# Patient Record
Sex: Female | Born: 1945
Health system: Southern US, Community
[De-identification: ages and names within clinical notes are randomized; demographics above are authoritative.]

## PROBLEM LIST (undated history)

## (undated) DIAGNOSIS — K635 Polyp of colon: Secondary | ICD-10-CM

## (undated) DIAGNOSIS — E785 Hyperlipidemia, unspecified: Secondary | ICD-10-CM

## (undated) DIAGNOSIS — E669 Obesity, unspecified: Secondary | ICD-10-CM

## (undated) DIAGNOSIS — D369 Benign neoplasm, unspecified site: Secondary | ICD-10-CM

## (undated) DIAGNOSIS — I10 Essential (primary) hypertension: Secondary | ICD-10-CM

## (undated) DIAGNOSIS — E119 Type 2 diabetes mellitus without complications: Secondary | ICD-10-CM

## (undated) DIAGNOSIS — I201 Angina pectoris with documented spasm: Secondary | ICD-10-CM

## (undated) HISTORY — DX: Essential (primary) hypertension: I10

## (undated) HISTORY — DX: Benign neoplasm, unspecified site: D36.9

## (undated) HISTORY — DX: Polyp of colon: K63.5

## (undated) HISTORY — DX: Hyperlipidemia, unspecified: E78.5

## (undated) HISTORY — DX: Obesity, unspecified: E66.9

## (undated) HISTORY — PX: APPENDECTOMY: SHX54

## (undated) HISTORY — PX: CARDIAC CATHETERIZATION: SHX172

## (undated) HISTORY — DX: Type 2 diabetes mellitus without complications: E11.9

## (undated) HISTORY — PX: CHOLECYSTECTOMY: SHX55

## (undated) HISTORY — PX: TONSILLECTOMY: SUR1361

## (undated) HISTORY — PX: VESICOVAGINAL FISTULA CLOSURE W/ TAH: SUR271

## (undated) HISTORY — DX: Angina pectoris with documented spasm: I20.1

---

## 2005-09-01 ENCOUNTER — Ambulatory Visit: Payer: Self-pay | Admitting: Pulmonary Disease

## 2005-09-01 ENCOUNTER — Ambulatory Visit: Payer: Self-pay | Admitting: Cardiology

## 2005-10-26 ENCOUNTER — Ambulatory Visit: Payer: Self-pay | Admitting: Pulmonary Disease

## 2005-11-19 ENCOUNTER — Ambulatory Visit: Payer: Self-pay | Admitting: Internal Medicine

## 2005-11-25 ENCOUNTER — Ambulatory Visit: Payer: Self-pay | Admitting: Internal Medicine

## 2005-11-25 ENCOUNTER — Encounter (INDEPENDENT_AMBULATORY_CARE_PROVIDER_SITE_OTHER): Payer: Self-pay | Admitting: *Deleted

## 2006-10-21 ENCOUNTER — Ambulatory Visit: Payer: Self-pay | Admitting: Critical Care Medicine

## 2006-10-24 DIAGNOSIS — I10 Essential (primary) hypertension: Secondary | ICD-10-CM | POA: Insufficient documentation

## 2006-10-24 DIAGNOSIS — E785 Hyperlipidemia, unspecified: Secondary | ICD-10-CM | POA: Insufficient documentation

## 2006-12-13 ENCOUNTER — Other Ambulatory Visit: Admission: RE | Admit: 2006-12-13 | Discharge: 2006-12-13 | Payer: Self-pay | Admitting: Obstetrics & Gynecology

## 2007-02-16 ENCOUNTER — Ambulatory Visit: Payer: Self-pay | Admitting: Critical Care Medicine

## 2007-03-17 ENCOUNTER — Ambulatory Visit: Payer: Self-pay | Admitting: Critical Care Medicine

## 2007-04-18 ENCOUNTER — Ambulatory Visit: Payer: Self-pay | Admitting: Critical Care Medicine

## 2007-04-21 DIAGNOSIS — J4551 Severe persistent asthma with (acute) exacerbation: Secondary | ICD-10-CM

## 2007-04-21 DIAGNOSIS — J4550 Severe persistent asthma, uncomplicated: Secondary | ICD-10-CM | POA: Insufficient documentation

## 2007-05-18 ENCOUNTER — Ambulatory Visit (HOSPITAL_COMMUNITY): Admission: RE | Admit: 2007-05-18 | Discharge: 2007-05-18 | Payer: Self-pay | Admitting: Neurological Surgery

## 2007-05-20 ENCOUNTER — Ambulatory Visit: Payer: Self-pay | Admitting: Critical Care Medicine

## 2007-06-21 ENCOUNTER — Ambulatory Visit: Payer: Self-pay | Admitting: Critical Care Medicine

## 2007-07-20 ENCOUNTER — Ambulatory Visit: Payer: Self-pay | Admitting: Critical Care Medicine

## 2007-07-20 ENCOUNTER — Telehealth (INDEPENDENT_AMBULATORY_CARE_PROVIDER_SITE_OTHER): Payer: Self-pay | Admitting: *Deleted

## 2007-07-21 ENCOUNTER — Telehealth (INDEPENDENT_AMBULATORY_CARE_PROVIDER_SITE_OTHER): Payer: Self-pay | Admitting: *Deleted

## 2007-08-01 ENCOUNTER — Ambulatory Visit: Payer: Self-pay | Admitting: Critical Care Medicine

## 2007-08-30 ENCOUNTER — Ambulatory Visit: Payer: Self-pay | Admitting: Critical Care Medicine

## 2007-09-27 ENCOUNTER — Encounter: Payer: Self-pay | Admitting: Critical Care Medicine

## 2007-10-07 ENCOUNTER — Ambulatory Visit: Payer: Self-pay | Admitting: Critical Care Medicine

## 2007-10-31 ENCOUNTER — Ambulatory Visit: Payer: Self-pay | Admitting: Critical Care Medicine

## 2007-11-22 ENCOUNTER — Ambulatory Visit: Payer: Self-pay | Admitting: Critical Care Medicine

## 2008-01-02 ENCOUNTER — Ambulatory Visit: Payer: Self-pay | Admitting: Critical Care Medicine

## 2008-01-08 ENCOUNTER — Encounter: Payer: Self-pay | Admitting: Critical Care Medicine

## 2008-01-25 ENCOUNTER — Ambulatory Visit: Payer: Self-pay | Admitting: Critical Care Medicine

## 2008-02-14 ENCOUNTER — Other Ambulatory Visit: Admission: RE | Admit: 2008-02-14 | Discharge: 2008-02-14 | Payer: Self-pay | Admitting: Obstetrics and Gynecology

## 2008-02-21 ENCOUNTER — Ambulatory Visit: Payer: Self-pay | Admitting: Critical Care Medicine

## 2008-02-27 ENCOUNTER — Ambulatory Visit: Payer: Self-pay | Admitting: Critical Care Medicine

## 2008-02-27 DIAGNOSIS — R609 Edema, unspecified: Secondary | ICD-10-CM | POA: Insufficient documentation

## 2008-02-28 LAB — CONVERTED CEMR LAB
Bilirubin, Direct: 0.3 mg/dL (ref 0.0–0.3)
CO2: 31 meq/L (ref 19–32)
GFR calc non Af Amer: 90 mL/min
Glucose, Bld: 135 mg/dL — ABNORMAL HIGH (ref 70–99)
Potassium: 3.9 meq/L (ref 3.5–5.1)
Pro B Natriuretic peptide (BNP): 30 pg/mL (ref 0.0–100.0)
Sodium: 143 meq/L (ref 135–145)
Total Bilirubin: 1.4 mg/dL — ABNORMAL HIGH (ref 0.3–1.2)

## 2008-04-02 ENCOUNTER — Ambulatory Visit: Payer: Self-pay | Admitting: Critical Care Medicine

## 2008-04-25 ENCOUNTER — Ambulatory Visit: Payer: Self-pay | Admitting: Critical Care Medicine

## 2008-05-25 ENCOUNTER — Ambulatory Visit: Payer: Self-pay | Admitting: Internal Medicine

## 2008-06-27 ENCOUNTER — Ambulatory Visit: Payer: Self-pay | Admitting: Critical Care Medicine

## 2008-08-03 ENCOUNTER — Ambulatory Visit: Payer: Self-pay | Admitting: Critical Care Medicine

## 2008-09-14 ENCOUNTER — Ambulatory Visit: Payer: Self-pay | Admitting: Critical Care Medicine

## 2008-10-19 ENCOUNTER — Ambulatory Visit: Payer: Self-pay | Admitting: Critical Care Medicine

## 2008-12-13 ENCOUNTER — Ambulatory Visit: Payer: Self-pay | Admitting: Critical Care Medicine

## 2008-12-31 ENCOUNTER — Ambulatory Visit: Payer: Self-pay | Admitting: Internal Medicine

## 2008-12-31 DIAGNOSIS — Z860100 Personal history of colon polyps, unspecified: Secondary | ICD-10-CM | POA: Insufficient documentation

## 2008-12-31 DIAGNOSIS — R1319 Other dysphagia: Secondary | ICD-10-CM | POA: Insufficient documentation

## 2009-01-10 ENCOUNTER — Ambulatory Visit: Payer: Self-pay | Admitting: Critical Care Medicine

## 2009-02-02 HISTORY — PX: CATARACT EXTRACTION, BILATERAL: SHX1313

## 2009-02-02 HISTORY — PX: ESOPHAGOGASTRODUODENOSCOPY: SHX1529

## 2009-02-13 ENCOUNTER — Encounter: Payer: Self-pay | Admitting: Critical Care Medicine

## 2009-02-22 ENCOUNTER — Ambulatory Visit: Payer: Self-pay | Admitting: Critical Care Medicine

## 2009-02-25 ENCOUNTER — Encounter: Payer: Self-pay | Admitting: Critical Care Medicine

## 2009-02-26 ENCOUNTER — Ambulatory Visit: Payer: Self-pay | Admitting: Internal Medicine

## 2009-02-27 ENCOUNTER — Telehealth: Payer: Self-pay | Admitting: Critical Care Medicine

## 2009-04-08 ENCOUNTER — Ambulatory Visit: Payer: Self-pay | Admitting: Critical Care Medicine

## 2009-04-10 ENCOUNTER — Telehealth: Payer: Self-pay | Admitting: Critical Care Medicine

## 2009-04-15 ENCOUNTER — Telehealth (INDEPENDENT_AMBULATORY_CARE_PROVIDER_SITE_OTHER): Payer: Self-pay | Admitting: *Deleted

## 2009-06-14 ENCOUNTER — Ambulatory Visit: Payer: Self-pay | Admitting: Critical Care Medicine

## 2009-07-19 ENCOUNTER — Ambulatory Visit: Payer: Self-pay | Admitting: Critical Care Medicine

## 2009-08-16 ENCOUNTER — Ambulatory Visit: Payer: Self-pay | Admitting: Critical Care Medicine

## 2009-09-27 ENCOUNTER — Ambulatory Visit: Payer: Self-pay | Admitting: Critical Care Medicine

## 2009-11-01 ENCOUNTER — Ambulatory Visit: Payer: Self-pay | Admitting: Critical Care Medicine

## 2009-12-06 ENCOUNTER — Ambulatory Visit: Payer: Self-pay | Admitting: Critical Care Medicine

## 2010-01-30 ENCOUNTER — Ambulatory Visit
Admission: RE | Admit: 2010-01-30 | Discharge: 2010-01-30 | Payer: Self-pay | Source: Home / Self Care | Attending: Critical Care Medicine | Admitting: Critical Care Medicine

## 2010-02-04 ENCOUNTER — Encounter
Admission: RE | Admit: 2010-02-04 | Discharge: 2010-02-04 | Payer: Self-pay | Source: Home / Self Care | Attending: Internal Medicine | Admitting: Internal Medicine

## 2010-02-14 ENCOUNTER — Ambulatory Visit
Admission: RE | Admit: 2010-02-14 | Discharge: 2010-02-14 | Payer: Self-pay | Source: Home / Self Care | Attending: Critical Care Medicine | Admitting: Critical Care Medicine

## 2010-03-04 NOTE — Assessment & Plan Note (Signed)
Summary: xoliar/cb   Nurse Visit   Allergies: No Known Drug Allergies  Medication Administration  Injection # 1:    Medication: Xolair (omalizumab) 150mg     Diagnosis: EXTRINSIC ASTHMA, UNSPECIFIED (ICD-493.00)    Route: IM    Site: R deltoid    Exp Date: 11/02/2012    Lot #: [id number]    Mfr: Genetech    Comments: xolair 150 mg and 30 units 1.2 ml  in left deltoid. Pt waited 30 mins    Given by: Dimas Millin, Allergy tech  Orders Added: 1)  Administration xolair injection [phone]

## 2010-03-04 NOTE — Miscellaneous (Signed)
Summary: rx omeperazole  Clinical Lists Changes  Medications: Added new medication of OMEPRAZOLE 20 MG  CPDR (OMEPRAZOLE) 1 each day 30 minutes before meal - Signed Rx of OMEPRAZOLE 20 MG  CPDR (OMEPRAZOLE) 1 each day 30 minutes before meal;  #30 x 11;  Signed;  Entered by: Oda Cogan;  Authorized by: Hilarie Fredrickson MD;  Method used: Electronically to [facility]*, [address], [address] Shipping/mailing, [place], [place]  [address], Ph: [phone], Fax: [phone]    Prescriptions: OMEPRAZOLE 20 MG  CPDR (OMEPRAZOLE) 1 each day 30 minutes before meal  #30 x 11   Entered by:   Oda Cogan   Authorized by:   Hilarie Fredrickson MD   Signed by:   Oda Cogan on 02/26/2009   Method used:   Electronically to        [facility]* (retail)       [address]       [address] Shipping/mailing       [place], [place]  [address]       Ph: [phone]       Fax: [phone]   RxID:   [id number]

## 2010-03-04 NOTE — Assessment & Plan Note (Signed)
Summary: xolair/apc   Nurse Visit   Allergies: No Known Drug Allergies  Medication Administration  Injection # 1:    Medication: Xolair (omalizumab) 150mg     Diagnosis: EXTRINSIC ASTHMA, UNSPECIFIED (ICD-493.00)    Route: SQ    Site: L deltoid    Exp Date: 12/04/2011    Lot #: [id number]    Mfr: genentech    Comments: 1.2ML IN LEFT ARM CHARGED PATIENT [phone]    Patient tolerated injection without complications    Given by: TAMMY SCOTT IN ALLERGY LAB   Medication Administration  Injection # 1:    Medication: Xolair (omalizumab) 150mg     Diagnosis: EXTRINSIC ASTHMA, UNSPECIFIED (ICD-493.00)    Route: SQ    Site: L deltoid    Exp Date: 12/04/2011    Lot #: [id number]    Mfr: genentech    Comments: 1.2ML IN LEFT ARM CHARGED PATIENT [id number]    Patient tolerated injection without complications    Given by: TAMMY SCOTT IN ALLERGY LAB

## 2010-03-04 NOTE — Procedures (Signed)
Summary: Upper Endoscopy  Patient: [name] Note: All result statuses are Final unless otherwise noted.  Tests: (1) Upper Endoscopy (EGD)   EGD Upper Endoscopy       DONE     [facility]     [address]     [place], [place]  [address]           ENDOSCOPY PROCEDURE REPORT           PATIENT:  Elaine Luna, Elaine Luna  MR#:  [id number]     BIRTHDATE:  24-Apr-1945, 63 yrs. old  GENDER:  female           ENDOSCOPIST:  Wilhemina Bonito. Eda Keys, MD     Referred by:  Creola Corn, M.D.           PROCEDURE DATE:  02/26/2009     PROCEDURE:  EGD, diagnostic,     Maloney Dilation of Esophagus -53F     ASA CLASS:  Class II     INDICATIONS:  dysphagia           MEDICATIONS:   Fentanyl 75 mcg IV, Versed 8 mg IV     TOPICAL ANESTHETIC:  Exactacain Spray           DESCRIPTION OF PROCEDURE:   After the risks benefits and     alternatives of the procedure were thoroughly explained, informed     consent was obtained.  The [facility] GIF-H180 [id number] endoscope was     introduced through the mouth and advanced to the second portion of     the duodenum, without limitations.  The instrument was slowly     withdrawn as the mucosa was fully examined.     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 The     proximal and middle, the esophagus were carefully inspected and no     abnormalities were noted. The z-line was well seen at the GEJ. The     endoscope was pushed into the fundus which was normal including a     retroflexed view. The antrum,gastric body, first and second part     of the duodenum were unremarkable.    Retroflexed views revealed     no abnormalities.    The scope was then withdrawn from the patient     and the procedure completed.           COMPLICATIONS:  None           ENDOSCOPIC IMPRESSION:     1) Stricture in the distal esophagus -s/p 53F Maloney dilation     2) Otherwise normal  examination     3) GERD           RECOMMENDATIONS:     1) Clear liquids until 1pm, then soft foods rest of day. Resume     prior diet tomorrow.     2) PPI qam - Omeprazole 20mg  daily; #30; 11 refills     3) Call office next 2-3 days to schedule an office appointment     with Dr Marina Goodell for about 6 weeks           ______________________________     Wilhemina Bonito. Eda Keys, MD           CC:  Creola Corn, MD, The Patient           n.  eSIGNED:   Wilhemina Bonito. Eda Keys at 02/26/2009 11:02 AM           Lawrence Santiago, [id number]  Note: An exclamation mark (!) indicates a result that was not dispersed into the flowsheet. Document Creation Date: 02/26/2009 11:02 AM _______________________________________________________________________  (1) Order result status: Final Collection or observation date-time: 02/26/2009 10:51 Requested date-time:  Receipt date-time:  Reported date-time:  Referring Physician:   Ordering Physician: Fransico Setters [phone]) Specimen Source:  Source: Launa Grill Order Number: [id number] Lab site:

## 2010-03-04 NOTE — Assessment & Plan Note (Signed)
Summary: Elaine Luna ///kp   Nurse Visit   Allergies: No Known Drug Allergies  Medication Administration  Injection # 1:    Medication: Xolair (omalizumab) 150mg     Diagnosis: EXTRINSIC ASTHMA, UNSPECIFIED (ICD-493.00)    Route: IM    Site: L deltoid    Exp Date: 11/02/2012    Lot #: [id number]    Mfr: Genetech    Comments: xolair150 mg , units 30 1.84ml in Left deltoid. Pt waited 30 mins    Given by: Dimas Millin, Allergy Tech  Orders Added: 1)  Administration xolair injection [phone]   Medication Administration  Injection # 1:    Medication: Xolair (omalizumab) 150mg     Diagnosis: EXTRINSIC ASTHMA, UNSPECIFIED (ICD-493.00)    Route: IM    Site: L deltoid    Exp Date: 11/02/2012    Lot #: [id number]    Mfr: Genetech    Comments: xolair150 mg , units 30 1.21ml in Left deltoid. Pt waited 30 mins    Given by: Dimas Millin, Allergy Tech  Orders Added: 1)  Administration xolair injection [phone]

## 2010-03-04 NOTE — Progress Notes (Signed)
Summary: Needs OV then will refill singular  Phone Note Outgoing Call   Call placed by: Gweneth Dimitri RN,  April 10, 2009 11:29 AM Summary of Call: Received refill request for singular however pt last seen by PW 02/27/2008 and has no pending appts.  LMOMTCB to inform pt per [place] law she must see provider at least once year to cont to receive refills.  once ov scheduled will send rx to pharm to last until ov but pt must come in for further rxs.   Initial call taken by: Gweneth Dimitri RN,  April 10, 2009 11:31 AM  Follow-up for Phone Call        Pt scheduled to see PW on march 28th. refill sent in for singulair. Carron Curie CMA  April 11, 2009 4:43 PM

## 2010-03-04 NOTE — Progress Notes (Signed)
   Phone Note Outgoing Call   Summary of Call:  Call placed on pt's. message machine to call to schedule her ROV. visit as recommended at the time of her EGD and Dilatation Initial call taken by: Teryl Lucy RN,  April 15, 2009 10:54 AM

## 2010-03-04 NOTE — Assessment & Plan Note (Signed)
Summary: Pulmonary OV   Copy to:  Creola Corn, MD  Primary Provider/Referring Provider:  Creola Corn, MD   CC:  Yearly follow up for rxs.  Pt states breathing was "rough during the winter" but since spring time breathing has been doing "fine."  Denies SOB, wheezing, chest tightness x 2months.  Pt states she does have an occ prod cough with clear mucus in the am.  Requesting rxs for singular, symbicort, and and ventolin-[facility].Marland Kitchen  History of Present Illness: Pulmonary OV  This is a 65 year old, white female with severe persistent asthma.    Jun 14, 2009 4:38 PM Pt doing well now on xolair.  Did have some problems over winter wiht bronchitis but now is better. Pt denies any significant sore throat, nasal congestion or excess secretions, fever, chills, sweats, unintended weight loss, pleurtic or exertional chest pain, orthopnea PND, or leg swelling Pt denies any increase in rescue therapy over baseline, denies waking up needing it or having any early am or nocturnal exacerbations of coughing/wheezing/or dyspnea. Pt also denies any obvious fluctuation in symptoms wiht weather or environmental change or other alleviating or aggravating factors  Was able to come off qvar.   Asthma History    Initial Asthma Severity Rating:    Age range: 12+ years    Symptoms: 0-2 days/week    Nighttime Awakenings: 0-2/month    Interferes w/ normal activity: no limitations    SABA use (not for EIB): 0-2 days/week    Exacerbations requiring oral systemic steroids: 0-1/year    Asthma Severity Assessment: Intermittent   Preventive Screening-Counseling & Management  Alcohol-Tobacco     Smoking Status: never  Current Medications (verified): 1)  Singulair 10 Mg  Tabs (Montelukast Sodium) .... One By Mouth Once Daily 2)  Lipitor 20 Mg  Tabs (Atorvastatin Calcium) .Marland Kitchen.. 1 By Mouth Daily 3)  Symbicort 160-4.5 Mcg/act  Aero (Budesonide-Formoterol Fumarate) .... Two Puff Two Times A Day 4)  Ventolin Hfa  108 (90 Base) Mcg/act  Aers (Albuterol Sulfate) .Marland Kitchen.. 1-2 Puffs Four Times Daily Prn 5)  Vitamin D (Ergocalciferol) 50000 Unit Caps (Ergocalciferol) .... One Tablet By Mouth Twice A Week 6)  Avapro 300 Mg Tabs (Irbesartan) .... Once Daily 7)  Omeprazole 20 Mg  Cpdr (Omeprazole) .Marland Kitchen.. 1 Each Day 30 Minutes Before Meal 8)  Xolair 150 Mg Solr (Omalizumab) .... 150mg  Subcutaneously Every 4 Weeks 9)  Prednisolone Acetate 1 % Susp (Prednisolone Acetate) .Marland Kitchen.. 1 Gtt Each Eye Qid X 1wk 10)  Bromday 0.09 % Soln (Bromfenac Sodium) .Marland Kitchen.. 1 Gtt Each Eye Once Daily 11)  Brevisence Eye Gtt .Marland Kitchen.. 1 Gtt Each Eye Two Times A Day  Allergies (verified): No Known Drug Allergies  Past History:  Past medical, surgical, family and social histories (including risk factors) reviewed, and no changes noted (except as noted below).  Past Medical History: Reviewed history from 12/31/2008 and no changes required. Asthma Hyperlipidemia Hypertension Prinzmetal's angina    -neg cath 1995 Colon Polyps-Tubular Adenoma Obesity  Past Surgical History: Appendectomy Hysterectomy Tonsillectomy Ear Surgery Cholecystectomy EGD with esophageal dilation 2/11 Cataract surgery 2011  bilateral  Family History: Reviewed history from 12/31/2008 and no changes required. Family History Asthma  in all of the pts children No FH of Colon Cancer: Family History of Diabetes: Brother, MGM, and MGF  Family History of Heart Disease: Brother, Mother, Father  Social History: Reviewed history from 12/31/2008 and no changes required. Patient never smoked.  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  Alcohol Use - no Daily Caffeine Use: 2 daily  Smoking Status:  never  Review of Systems       The patient complains of shortness of breath with activity, productive cough, and non-productive cough.  The patient denies shortness of breath at rest, coughing up blood, chest pain, irregular heartbeats,  acid heartburn, indigestion, loss of appetite, weight change, abdominal pain, difficulty swallowing, sore throat, tooth/dental problems, headaches, nasal congestion/difficulty breathing through nose, sneezing, itching, ear ache, anxiety, depression, hand/feet swelling, joint stiffness or pain, rash, change in color of mucus, and fever.    Vital Signs:  Patient profile:   65 year old female Height:      64 inches Weight:      192 pounds BMI:     33.08 O2 Sat:      96 % on Room air Temp:     97.8 degrees F oral Pulse rate:   61 / minute BP sitting:   122 / 70  (right arm) Cuff size:   regular  Vitals Entered By: Gweneth Dimitri RN (Jun 14, 2009 4:28 PM)  Nutrition Counseling: Patient's BMI is greater than 25 and therefore counseled on weight management options.  O2 Flow:  Room air CC: Yearly follow up for rxs.  Pt states breathing was "rough during the winter" but since spring time breathing has been doing "fine."  Denies SOB, wheezing, chest tightness x 2months.  Pt states she does have an occ prod cough with clear mucus in the am.  Requesting rxs for singular, symbicort, and ventolin-[facility]. Comments Medications reviewed with patient Daytime contact number verified with patient. Gweneth Dimitri RN  Jun 14, 2009 4:29 PM    Physical Exam  Additional Exam:  Gen: WD WN      WF     in NAD    NCAT Heent:  no jvd, no TMG, no cervical LNademopathy, orophyx clear,  nares with clear watery drainage. Cor: RRR nl s1/s2  no s3/s4  no m r h g Abd: soft NT BSA   no masses  No HSM  no rebound or guarding Ext perfused with no c v e v.d Neuro: intact, moves all 4s, CN II-XII intact, DTRs intact Chest: no wheezes .  no rales or rhonchi.   Skin: clear  Genital/Rectal :deferred    Impression & Recommendations:  Problem # 1:  EXTRINSIC ASTHMA, UNSPECIFIED (ICD-493.00) Assessment Unchanged  Moderate persistent asthma with atopy stable at this time plan: stay off qvar cont singulair cont  LABA/ICS cont xolair  Medications Added to Medication List This Visit: 1)  Avapro 300 Mg Tabs (Irbesartan) .... Once daily 2)  Prednisolone Acetate 1 % Susp (Prednisolone acetate) .Marland Kitchen.. 1 gtt each eye qid x 1wk 3)  Bromday 0.09 % Soln (Bromfenac sodium) .Marland Kitchen.. 1 gtt each eye once daily 4)  Brevisence Eye Gtt  .Marland Kitchen.. 1 gtt each eye two times a day  Complete Medication List: 1)  Singulair 10 Mg Tabs (Montelukast sodium) .... One by mouth once daily 2)  Lipitor 20 Mg Tabs (Atorvastatin calcium) .Marland Kitchen.. 1 by mouth daily 3)  Symbicort 160-4.5 Mcg/act Aero (Budesonide-formoterol fumarate) .... Two puff two times a day 4)  Ventolin Hfa 108 (90 Base) Mcg/act Aers (Albuterol sulfate) .Marland Kitchen.. 1-2 puffs four times daily prn 5)  Vitamin D (ergocalciferol) 50000 Unit Caps (Ergocalciferol) .... One tablet by mouth twice a week 6)  Avapro 300 Mg Tabs (Irbesartan) .... Once daily 7)  Omeprazole 20 Mg  Cpdr (Omeprazole) .Marland Kitchen.. 1 each day 30 minutes before meal 8)  Xolair 150 Mg Solr (Omalizumab) .... 150mg  subcutaneously every 4 weeks 9)  Prednisolone Acetate 1 % Susp (Prednisolone acetate) .Marland Kitchen.. 1 gtt each eye qid x 1wk 10)  Bromday 0.09 % Soln (Bromfenac sodium) .Marland Kitchen.. 1 gtt each eye once daily 11)  Brevisence Eye Gtt  .Marland Kitchen.. 1 gtt each eye two times a day  Other Orders: Est. Patient Level III ([address]) Prescription Created Electronically [phone]) Xolair (omalizumab) 150mg  (U9811) Admin of patients own med IM/SQ [phone])  Patient Instructions: 1)  No change in medications 2)  Return 6 months, call sooner if needed Prescriptions: VENTOLIN HFA 108 (90 BASE) MCG/ACT  AERS (ALBUTEROL SULFATE) 1-2 puffs four times daily prn  #2 x 4   Entered and Authorized by:   Storm Frisk MD   Signed by:   Storm Frisk MD on 06/14/2009   Method used:   Electronically to        [facility]* (retail)       [address]       [address] Shipping/mailing       [place], [place]  [address]       Ph:  [phone]       Fax: [phone]   RxID:   [id number] SYMBICORT 160-4.5 MCG/ACT  AERO (BUDESONIDE-FORMOTEROL FUMARATE) two puff two times a day  #3 x 4   Entered and Authorized by:   Storm Frisk MD   Signed by:   Storm Frisk MD on 06/14/2009   Method used:   Electronically to        [facility]* (retail)       [address]       [address] Shipping/mailing       [place], [place]  [address]       Ph: [phone]       Fax: [phone]   RxID:   [id number] SINGULAIR 10 MG  TABS (MONTELUKAST SODIUM) one by mouth once daily  #90 Tablet x 4   Entered and Authorized by:   Storm Frisk MD   Signed by:   Storm Frisk MD on 06/14/2009   Method used:   Electronically to        [facility]* (retail)       [address]       [address] Shipping/mailing       [place], [place]  [address]       Ph: [phone]       Fax: [phone]   RxID:   [id number]    Medication Administration  Injection # 1:    Medication: Xolair (omalizumab) 150mg     Diagnosis: EXTRINSIC ASTHMA, UNSPECIFIED (ICD-493.00)    Route: SQ    Site: L deltoid    Exp Date: 04/2012    Lot #: [id number]    Mfr: genetech    Comments: Xolair 150mg  given Given by Dimas Millin in Allergy Lab. 1.2 mL x1 in left deltoid. Pt was seen by MD after injection    Patient tolerated injection without complications  Orders Added: 1)  Est. Patient Level III [[address]] 2)  Prescription Created Electronically [[id number]] 3)  Xolair (omalizumab) 150mg  [J2357] 4)  Admin of patients own med IM/SQ [[id number]]    Appended Document: Pulmonary OV fax Creola Corn

## 2010-03-04 NOTE — Progress Notes (Signed)
Summary: Written Xolair Rx  Phone Note Call from Patient   Caller: Patient Call For: Dr. Delford Field Summary of Call: Pt is taking Xolair 150 mg q 4 wks. 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 Please give me a written Rx for Xolair 150 mg and I will fax this to the pharmacy for her. Thank you! Alfonso Ramus  February 27, 2009 10:46 AM  Initial call taken by: Alfonso Ramus,  February 27, 2009 10:47 AM  Follow-up for Phone Call        done  Follow-up by: Storm Frisk MD,  February 27, 2009 11:07 AM    Additional Follow-up for Phone Call Additional follow up Details #2::    Xolair Rx faxed to [facility] for pt when ready for next vial of Xolair. Pt must p/u from pharmacy and bring with her to appt. Pt aware. Alfonso Ramus  February 27, 2009 2:11 PM   New/Updated Medications: XOLAIR 150 MG SOLR (OMALIZUMAB) 150mg  subcutaneously every 4 weeks Prescriptions: XOLAIR 150 MG SOLR (OMALIZUMAB) 150mg  subcutaneously every 4 weeks  #1 month x 6   Entered and Authorized by:   Storm Frisk MD   Signed by:   Storm Frisk MD on 02/27/2009   Method used:   Print then Give to Patient   RxID:   [id number]

## 2010-03-04 NOTE — Assessment & Plan Note (Signed)
Summary: Terrilyn Saver   Nurse Visit   Allergies: No Known Drug Allergies  Medication Administration  Injection # 1:    Medication: Xolair (omalizumab) 150mg     Diagnosis: 493.00    Route: SQ    Site: L deltoid    Exp Date: 09/2012    Lot #: [id number]    Mfr: Salome Spotted    Comments: 1.2 ML IN LEFT ARM PT WAITED 30 MINS CHARGED [phone]    Patient tolerated injection without complications    Given by: TAMMY SCOTT IN ALLERGY LAB   Medication Administration  Injection # 1:    Medication: Xolair (omalizumab) 150mg     Diagnosis: 493.00    Route: SQ    Site: L deltoid    Exp Date: 09/2012    Lot #: [id number]    Mfr: Salome Spotted    Comments: 1.2 ML IN LEFT ARM PT WAITED 30 MINS CHARGED 96401    Patient tolerated injection without complications    Given by: TAMMY SCOTT IN ALLERGY LAB

## 2010-03-04 NOTE — Assessment & Plan Note (Signed)
Summary: xolair/klw   Nurse Visit   Allergies: No Known Drug Allergies  Medication Administration  Injection # 1:    Medication: Xolair (omalizumab) 150mg     Diagnosis: EXTRINSIC ASTHMA, UNSPECIFIED (ICD-493.00)    Route: SQ    Site: L deltoid    Exp Date: 12/2011    Lot #: [id number]    Mfr: Mendel Ryder    Comments: Injection given by Drucie Opitz, CMA in allergy lab. Xolair 150mg . 1.27ml x 1 in Left Deltoid. Pt waited 10 minutes.     Patient tolerated injection without complications  Orders Added: 1)  Admin of patients own med IM/SQ [[id number]]

## 2010-03-04 NOTE — Medication Information (Signed)
Summary: Tax adviser   Imported By: Valinda Hoar 02/25/2009 09:48:17  _____________________________________________________________________  External Attachment:    Type:   Image     Comment:   External Document

## 2010-03-04 NOTE — Medication Information (Signed)
Summary: P Auth XOLAIR/[facility]  P Auth XOLAIR/[facility]   Imported By: Lester Easton 02/20/2009 10:21:28  _____________________________________________________________________  External Attachment:    Type:   Image     Comment:   External Document

## 2010-03-04 NOTE — Assessment & Plan Note (Signed)
Summary: xolair/jd   Nurse Visit   Allergies: No Known Drug Allergies  Medication Administration  Injection # 1:    Medication: Xolair (omalizumab) 150mg     Diagnosis: EXTRINSIC ASTHMA, UNSPECIFIED (ICD-493.00)    Route: IM    Site: R deltoid    Exp Date: 11/02/2012    Lot #: [id number]    Mfr: Salome Spotted    Comments: xolair 150 mg, 30 units, 1.2 ml x right arm and 1.2 ml x left arm.  pt waited 30 mins.    Given by: Dimas Millin, allergy tech  Orders Added: 1)  Administration xolair injection [phone]   Medication Administration  Injection # 1:    Medication: Xolair (omalizumab) 150mg     Diagnosis: EXTRINSIC ASTHMA, UNSPECIFIED (ICD-493.00)    Route: IM    Site: R deltoid    Exp Date: 11/02/2012    Lot #: [id number]    Mfr: Genetech    Comments: xolair 150 mg, 30 units, 1.2 ml x right arm and 1.2 ml x left arm.  pt waited 30 mins.    Given by: Dimas Millin, allergy tech  Orders Added: 1)  Administration xolair injection [phone]

## 2010-03-04 NOTE — Assessment & Plan Note (Signed)
Summary: xolair/apc   Nurse Visit   Allergies: No Known Drug Allergies  Medication Administration  Injection # 1:    Medication: Xolair (omalizumab) 150mg     Diagnosis: EXTRINSIC ASTHMA, UNSPECIFIED (ICD-493.00)    Route: SQ    Site: R deltoid    Exp Date: 12/2011    Lot #: [id number]    Mfr: Mendel Ryder    Comments: Injection given by Dimas Millin in allergy lab. Xolair 150mg . 1.84ml x 1 in Right Deltoid. Pt waited 30 minutes.     Patient tolerated injection without complications  Orders Added: 1)  Admin of patients own med IM/SQ [[id number]]

## 2010-03-06 NOTE — Assessment & Plan Note (Signed)
Summary: XOLAIR//SH   Nurse Visit   Allergies: No Known Drug Allergies  Medication Administration  Injection # 1:    Medication: Xolair (omalizumab) 150mg     Diagnosis: EXTRINSIC ASTHMA, UNSPECIFIED (ICD-493.00)    Route: SQ    Site: L deltoid    Exp Date: 02/2013    Lot #: [id number]    Mfr: Genetech    Comments: 1.2 ML IN LEFT ARM 150 MG CHARGED 96401    Given by: Dimas Millin IN ALLERGY LAB  Orders Added: 1)  Administration xolair injection [id number]   Medication Administration  Injection # 1:    Medication: Xolair (omalizumab) 150mg     Diagnosis: EXTRINSIC ASTHMA, UNSPECIFIED (ICD-493.00)    Route: SQ    Site: L deltoid    Exp Date: 02/2013    Lot #: [id number]    Mfr: Genetech    Comments: 1.2 ML IN LEFT ARM 150 MG CHARGED 96401    Given by: Babette Relic SCOTT IN ALLERGY LAB  Orders Added: 1)  Administration xolair injection [[id number]]

## 2010-03-06 NOTE — Assessment & Plan Note (Signed)
Summary: Pulmonary OV   Copy to:  Creola Corn, MD  Primary Provider/Referring Provider:  Creola Corn, MD   CC:  Follow up.  Pt states breathing is unchanged.  Wheezing and chest tightness at times.  Prod cough with clear mucus..  History of Present Illness: Pulmonary OV  This is a 65 year old, white female with severe persistent asthma.    Jun 14, 2009 4:38 PM Pt doing well now on xolair.  Did have some problems over winter wiht bronchitis but now is better. Pt denies any significant sore throat, nasal congestion or excess secretions, fever, chills, sweats, unintended weight loss, pleurtic or exertional chest pain, orthopnea PND, or leg swelling Pt denies any increase in rescue therapy over baseline, denies waking up needing it or having any early am or nocturnal exacerbations of coughing/wheezing/or dyspnea. Pt also denies any obvious fluctuation in symptoms wiht weather or environmental change or other alleviating or aggravating factors  Was able to come off qvar.  February 14, 2010 11:20 AM Xolair has helped.  doing well in winter time.  No ED visits Still with wheezing ,  no ED visits since 5/11 Pt denies any significant sore throat, nasal congestion or excess secretions, fever, chills, sweats, unintended weight loss, pleurtic or exertional chest pain, orthopnea PND, or leg swelling Pt denies any increase in rescue therapy over baseline, denies waking up needing it or having any early am or nocturnal exacerbations of coughing/wheezing/or dyspnea.   Asthma History    Asthma Control Assessment:    Age range: 12+ years    Symptoms: 0-2 days/week    Nighttime Awakenings: 0-2/month    Interferes w/ normal activity: no limitations    SABA use (not for EIB): >2 days/week    ATAQ questionnaire: 0    Exacerbations requiring oral systemic steroids: 0-1/year    Asthma Control Assessment: Not Well Controlled   Current Medications (verified): 1)  Singulair 10 Mg  Tabs (Montelukast  Sodium) .... One By Mouth Once Daily 2)  Lipitor 20 Mg  Tabs (Atorvastatin Calcium) .Marland Kitchen.. 1 By Mouth Daily 3)  Symbicort 160-4.5 Mcg/act  Aero (Budesonide-Formoterol Fumarate) .... Two Puff Two Times A Day 4)  Ventolin Hfa 108 (90 Base) Mcg/act  Aers (Albuterol Sulfate) .Marland Kitchen.. 1-2 Puffs Four Times Daily Prn 5)  Vitamin D (Ergocalciferol) 50000 Unit Caps (Ergocalciferol) .... One Tablet By Mouth Twice A Week 6)  Avapro 300 Mg Tabs (Irbesartan) .... Once Daily 7)  Omeprazole 20 Mg  Cpdr (Omeprazole) .Marland Kitchen.. 1 Each Day 30 Minutes Before Meal 8)  Xolair 150 Mg Solr (Omalizumab) .... 150mg  Subcutaneously Every 4 Weeks  Allergies (verified): No Known Drug Allergies  Past History:  Past medical, surgical, family and social histories (including risk factors) reviewed, and no changes noted (except as noted below).  Past Medical History: Reviewed history from 12/31/2008 and no changes required. Asthma Hyperlipidemia Hypertension Prinzmetal's angina    -neg cath 1995 Colon Polyps-Tubular Adenoma Obesity  Past Surgical History: Reviewed history from 06/14/2009 and no changes required. Appendectomy Hysterectomy Tonsillectomy Ear Surgery Cholecystectomy EGD with esophageal dilation 2/11 Cataract surgery 2011  bilateral  Family History: Reviewed history from 12/31/2008 and no changes required. Family History Asthma  in all of the pts children No FH of Colon Cancer: Family History of Diabetes: Brother, MGM, and MGF  Family History of Heart Disease: Brother, Mother, Father  Social History: Reviewed history from 12/31/2008 and no changes required. Patient never smoked.  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  Alcohol Use - no Daily Caffeine Use: 2 daily   Review of Systems       The patient complains of shortness of breath with activity.  The patient denies shortness of breath at rest, productive cough, non-productive cough, coughing up blood,  chest pain, irregular heartbeats, acid heartburn, indigestion, loss of appetite, weight change, abdominal pain, difficulty swallowing, sore throat, tooth/dental problems, headaches, nasal congestion/difficulty breathing through nose, sneezing, itching, ear ache, anxiety, depression, hand/feet swelling, joint stiffness or pain, rash, change in color of mucus, and fever.    Vital Signs:  Patient profile:   65 year old female Height:      64 inches Weight:      196.38 pounds BMI:     33.83 O2 Sat:      96 % on Room air Pulse rate:   58 / minute BP sitting:   150 / 86  (right arm) Cuff size:   large  Vitals Entered By: Gweneth Dimitri RN (February 14, 2010 11:14 AM)  O2 Flow:  Room air CC: Follow up.  Pt states breathing is unchanged.  Wheezing and chest tightness at times.  Prod cough with clear mucus. Comments Medications reviewed with patient Daytime contact number verified with patient. Crystal Jones RN  February 14, 2010 11:15 AM    Physical Exam  Additional Exam:  Gen: WD WN      WF     in NAD    NCAT Heent:  no jvd, no TMG, no cervical LNademopathy, orophyx clear,  nares with clear watery drainage. Cor: RRR nl s1/s2  no s3/s4  no m r h g Abd: soft NT BSA   no masses  No HSM  no rebound or guarding Ext perfused with no c v e v.d Neuro: intact, moves all 4s, CN II-XII intact, DTRs intact Chest: no wheezes .  no rales or rhonchi.   Skin: clear  Genital/Rectal :deferred    Impression & Recommendations:  Problem # 1:  EXTRINSIC ASTHMA, UNSPECIFIED (ICD-493.00) Assessment Unchanged Moderate persistent asthma with atopy stable at this time plan: stay off qvar cont singulair cont LABA/ICS cont xolair  Medications Added to Medication List This Visit: 1)  Albuterol Sulfate (2.5 Mg/39ml) 0.083% Nebu (Albuterol sulfate) .... Four times daily or every 6 hours as needed  Complete Medication List: 1)  Singulair 10 Mg Tabs (Montelukast sodium) .... One by mouth once daily 2)   Lipitor 20 Mg Tabs (Atorvastatin calcium) .Marland Kitchen.. 1 by mouth daily 3)  Symbicort 160-4.5 Mcg/act Aero (Budesonide-formoterol fumarate) .... Two puff two times a day 4)  Ventolin Hfa 108 (90 Base) Mcg/act Aers (Albuterol sulfate) .Marland Kitchen.. 1-2 puffs four times daily prn 5)  Vitamin D (ergocalciferol) 50000 Unit Caps (Ergocalciferol) .... One tablet by mouth twice a week 6)  Avapro 300 Mg Tabs (Irbesartan) .... Once daily 7)  Omeprazole 20 Mg Cpdr (Omeprazole) .Marland Kitchen.. 1 each day 30 minutes before meal 8)  Xolair 150 Mg Solr (Omalizumab) .... 150mg  subcutaneously every 4 weeks 9)  Albuterol Sulfate (2.5 Mg/76ml) 0.083% Nebu (Albuterol sulfate) .... Four times daily or every 6 hours as needed  Other Orders: Est. Patient Level III (60454)  Patient Instructions: 1)  No change in medications 2)  Return in    6-8      months Prescriptions: ALBUTEROL SULFATE (2.5 MG/3ML) 0.083%  NEBU (ALBUTEROL SULFATE) Four times daily or every 6 hours as needed  #60 x 6   Entered and Authorized by:  Storm Frisk MD   Signed by:   Storm Frisk MD on 02/14/2010   Method used:   Electronically to        [facility]* (retail)       [address]       [address] Shipping/mailing       [place], [place]  [address]       Ph: [phone]       Fax: [phone]   RxID:   [id number] VENTOLIN HFA 108 (90 BASE) MCG/ACT  AERS (ALBUTEROL SULFATE) 1-2 puffs four times daily prn  #2 x 4   Entered and Authorized by:   Storm Frisk MD   Signed by:   Storm Frisk MD on 02/14/2010   Method used:   Electronically to        [facility]* (retail)       [address]       [address] Shipping/mailing       [place], [place]  [address]       Ph: [phone]       Fax: [phone]   RxID:   [id number] SYMBICORT 160-4.5 MCG/ACT  AERO (BUDESONIDE-FORMOTEROL FUMARATE) two puff two times a day  #3 x 4   Entered and Authorized by:   Storm Frisk MD   Signed by:    Storm Frisk MD on 02/14/2010   Method used:   Electronically to        [facility]* (retail)       [address]       [address] Shipping/mailing       [place], [place]  [address]       Ph: [phone]       Fax: [phone]   RxID:   [id number] SINGULAIR 10 MG  TABS (MONTELUKAST SODIUM) one by mouth once daily  #90 Tablet x 4   Entered and Authorized by:   Storm Frisk MD   Signed by:   Storm Frisk MD on 02/14/2010   Method used:   Electronically to        [facility]* (retail)       [address]       [address] Shipping/mailing       [place], [place]  [address]       Ph: [phone]       Fax: [phone]   RxID:   [id number]

## 2010-03-21 ENCOUNTER — Encounter: Payer: Self-pay | Admitting: Critical Care Medicine

## 2010-03-21 ENCOUNTER — Ambulatory Visit (INDEPENDENT_AMBULATORY_CARE_PROVIDER_SITE_OTHER): Payer: Commercial Managed Care - PPO

## 2010-03-21 DIAGNOSIS — J45909 Unspecified asthma, uncomplicated: Secondary | ICD-10-CM

## 2010-04-01 NOTE — Assessment & Plan Note (Signed)
Summary: Elaine Luna ///kp   Nurse Visit   Allergies: No Known Drug Allergies  Medication Administration  Injection # 1:    Medication: Xolair (omalizumab) 150mg     Diagnosis: EXTRINSIC ASTHMA, UNSPECIFIED (ICD-493.00)    Route: SQ    Site: L deltoid    Exp Date: 04/2013    Lot #: [id number]    Mfr: Genetech    Comments: 1.2 ML IN LEFT ARM 150 MG CHARGED [id number]    Given by: Dimas Millin IN ALLERGY LAB  Orders Added: 1)  Xolair (omalizumab) 150mg  [J2357] 2)  Administration xolair injection [[id number]]   Medication Administration  Injection # 1:    Medication: Xolair (omalizumab) 150mg     Diagnosis: EXTRINSIC ASTHMA, UNSPECIFIED (ICD-493.00)    Route: SQ    Site: L deltoid    Exp Date: 04/2013    Lot #: [id number]    Mfr: [employer]et[employer]    Comments: 1.2 ML IN LEFT ARM 150 MG CHARGED [id number]    Given by: Babette Relic SCOTT IN ALLERGY LAB  Orders Added: 1)  Xolair (omalizumab) 150mg  [J2357] 2)  Administration xolair injection [[id number]]

## 2010-04-14 ENCOUNTER — Ambulatory Visit (INDEPENDENT_AMBULATORY_CARE_PROVIDER_SITE_OTHER): Payer: Commercial Managed Care - PPO

## 2010-04-14 ENCOUNTER — Encounter: Payer: Self-pay | Admitting: Critical Care Medicine

## 2010-04-14 DIAGNOSIS — J45909 Unspecified asthma, uncomplicated: Secondary | ICD-10-CM

## 2010-04-22 NOTE — Assessment & Plan Note (Signed)
Summary: Elaine Luna WITH HER/LED   Nurse Visit   Allergies: No Known Drug Allergies  Medication Administration  Injection # 1:    Medication: Xolair (omalizumab) 150mg     Diagnosis: EXTRINSIC ASTHMA, UNSPECIFIED (ICD-493.00)    Route: SQ    Site: L deltoid    Exp Date: 05/2013    Lot #: [id number]    Mfr: [employer]et[employer]    Comments: 1.2 ML IN LEFT ARM  150MG  CHARGED [id number] AND [id number]     Given by: Dimas Millin IN [facility]ERGY LAB  Orders Added: 1)  Xolair (omalizumab) 150mg  [J2357] 2)  Administration xolair injection [[id number]]   Medication Administration  Injection # 1:    Medication: Xolair (omalizumab) 150mg     Diagnosis: EXTRINSIC ASTHMA, UNSPECIFIED (ICD-493.00)    Route: SQ    Site: L deltoid    Exp Date: 05/2013    Lot #: [id number]    Mfr: [employer]et[employer]    Comments: 1.2 ML IN LEFT ARM  150MG  CHARGED [id number] AND [id number]     Given by: Dimas Millin IN ALLERGY LAB  Orders Added: 1)  Xolair (omalizumab) 150mg  [J2357] 2)  Administration xolair injection [[id number]]

## 2010-05-26 ENCOUNTER — Ambulatory Visit (INDEPENDENT_AMBULATORY_CARE_PROVIDER_SITE_OTHER): Payer: Commercial Managed Care - PPO

## 2010-05-26 DIAGNOSIS — J45909 Unspecified asthma, uncomplicated: Secondary | ICD-10-CM

## 2010-05-26 MED ORDER — OMALIZUMAB 150 MG ~~LOC~~ SOLR
150.0000 mg | Freq: Once | SUBCUTANEOUS | Status: AC
  Administered 2010-05-26: 150 mg via SUBCUTANEOUS

## 2010-06-17 NOTE — Assessment & Plan Note (Signed)
[facility]                             PULMONARY OFFICE NOTE   NAIMAH, YINGST                     MRN:          [id number]  DATE:10/21/2006                            DOB:          1945-12-27    Ms. Polhemus is seen today in return followup.  A 65 year old female with  a history of moderate persistent asthma, former patient of Dr. Jayme Cloud,  here to establish.  She is needing med refills.  She has had 2 asthma  flares since September of last year.  The last flare was 8 weeks ago.  However, she is using her albuterol inhaler twice daily now.  She has  had allergy testing in the past that shows positivity to molds and a  variety of other allergens.   MEDICATIONS:  1. She is on Symbicort 80/4.5 one spray b.i.d.  2. She is also on Singulair 10 mg daily.  3. Utilizing albuterol p.r.n.   EXAM:  Temperature 97, blood pressure 158/72, pulse 65, saturation 96%  on room air.  CHEST:  Showed expiratory wheeze with poor air flow.  CARDIAC:  Regular rate and rhythm without S3.  Normal S1, S2.  ABDOMEN:  Soft and nontender.  EXTREMITIES:  No edema or clubbing.  SKIN:  Clear.  NEUROLOGIC:  Intact.  HEENT:  Showed no jugular venous distension.  No lymphadenopathy.   IMPRESSION:  Moderate persistent asthma with flare.   PLAN:  To increase Symbicort to 160/4.5 at 2 sprays b.i.d.  Zegerid will  be discontinued as there does not appear to be a reflux-mediated feature  here.  Refills are given on Proventil HFA and Singulair.  We will also  administer prednisone 40 mg a day with a slow taper, down by 10 mg over  4 days until off, and a RAST IgE assay will be obtained in consideration  for Xolair therapy will be given.     Charlcie Cradle Delford Field, MD, [facility]  Electronically Signed    PEW/MedQ  DD: 10/22/2006  DT: 10/22/2006  Job #: [id number]

## 2010-06-27 ENCOUNTER — Ambulatory Visit (INDEPENDENT_AMBULATORY_CARE_PROVIDER_SITE_OTHER): Payer: Commercial Managed Care - PPO

## 2010-06-27 DIAGNOSIS — J45909 Unspecified asthma, uncomplicated: Secondary | ICD-10-CM

## 2010-06-29 MED ORDER — OMALIZUMAB 150 MG ~~LOC~~ SOLR
150.0000 mg | Freq: Once | SUBCUTANEOUS | Status: AC
  Administered 2010-06-29: 150 mg via SUBCUTANEOUS

## 2010-07-25 ENCOUNTER — Ambulatory Visit (INDEPENDENT_AMBULATORY_CARE_PROVIDER_SITE_OTHER): Payer: Commercial Managed Care - PPO

## 2010-07-25 DIAGNOSIS — J45909 Unspecified asthma, uncomplicated: Secondary | ICD-10-CM

## 2010-07-28 MED ORDER — OMALIZUMAB 150 MG ~~LOC~~ SOLR
150.0000 mg | Freq: Once | SUBCUTANEOUS | Status: AC
  Administered 2010-07-28: 150 mg via SUBCUTANEOUS

## 2010-09-11 ENCOUNTER — Ambulatory Visit (INDEPENDENT_AMBULATORY_CARE_PROVIDER_SITE_OTHER): Payer: Commercial Managed Care - PPO

## 2010-09-11 DIAGNOSIS — J45909 Unspecified asthma, uncomplicated: Secondary | ICD-10-CM

## 2010-09-12 MED ORDER — OMALIZUMAB 150 MG ~~LOC~~ SOLR
150.0000 mg | Freq: Once | SUBCUTANEOUS | Status: AC
  Administered 2010-09-12: 150 mg via SUBCUTANEOUS

## 2010-10-24 ENCOUNTER — Ambulatory Visit (INDEPENDENT_AMBULATORY_CARE_PROVIDER_SITE_OTHER): Payer: Commercial Managed Care - PPO

## 2010-10-24 DIAGNOSIS — J45909 Unspecified asthma, uncomplicated: Secondary | ICD-10-CM

## 2010-10-24 MED ORDER — OMALIZUMAB 150 MG ~~LOC~~ SOLR
150.0000 mg | Freq: Once | SUBCUTANEOUS | Status: AC
  Administered 2010-10-24: 150 mg via SUBCUTANEOUS

## 2010-11-25 ENCOUNTER — Encounter: Payer: Self-pay | Admitting: Internal Medicine

## 2011-01-12 ENCOUNTER — Other Ambulatory Visit: Payer: Self-pay | Admitting: Critical Care Medicine

## 2011-03-19 ENCOUNTER — Other Ambulatory Visit: Payer: Self-pay | Admitting: Critical Care Medicine

## 2011-03-20 NOTE — Telephone Encounter (Signed)
Pt was last seen by Dr. Delford Field on 02/14/10 and has a pending OV with him on 04/27/11.  I called, spoke with pt.  I informed her rxs will be sent to [facility] outpt pharm to asked she please keep OV on 04/27/11 for additional rxs.  She verbalized understanding of this.

## 2011-04-24 ENCOUNTER — Encounter: Payer: Self-pay | Admitting: Critical Care Medicine

## 2011-04-27 ENCOUNTER — Ambulatory Visit (INDEPENDENT_AMBULATORY_CARE_PROVIDER_SITE_OTHER): Payer: 59 | Admitting: Critical Care Medicine

## 2011-04-27 ENCOUNTER — Encounter: Payer: Self-pay | Admitting: Critical Care Medicine

## 2011-04-27 VITALS — BP 148/70 | HR 60 | Temp 98.3°F | Ht 64.0 in | Wt 173.8 lb

## 2011-04-27 DIAGNOSIS — J45909 Unspecified asthma, uncomplicated: Secondary | ICD-10-CM

## 2011-04-27 NOTE — Progress Notes (Signed)
Subjective:    Patient ID: Elaine Luna, female    DOB: [birth year], 66 y.o.   MRN: [id number]  HPI  This is a 66 year old, white female with severe persistent asthma.    04/27/2011 Since last ov .  No ED visits. No asthma issues.  Changed diet drastically.  Zero carb diet. No real cough.  No rescue inhaler use Pt denies any significant sore throat, nasal congestion or excess secretions, fever, chills, sweats, unintended weight loss, pleurtic or exertional chest pain, orthopnea PND, or leg swelling Pt denies any increase in rescue therapy over baseline, denies waking up needing it or having any early am or nocturnal exacerbations of coughing/wheezing/or dyspnea. Pt also denies any obvious fluctuation in symptoms with  weather or environmental change or other alleviating or aggravating factors  PUL ASTHMA HISTORY 04/27/2011  Symptoms 0-2 days/week  Nighttime awakenings 0-2/month  Interference with activity No limitations  SABA use 0-2 days/wk  Exacerbations requiring oral steroids 0-1 / year     Past Medical History  Diagnosis Date  . Asthma   . Hyperlipidemia   . Hypertension   . Prinzmetal angina   . Tubular adenoma   . Colon polyps   . Obesity      Family History  Problem Relation Age of Onset  . Asthma      all children  . Diabetes Maternal Grandfather   . Diabetes Maternal Grandmother   . Diabetes Brother   . Heart disease Mother   . Heart disease Brother   . Heart disease Father      History   Social History  . Marital Status: Widowed    Spouse Name: N/A    Number of Children: 4  . Years of Education: N/A   Occupational History  . RN [employer]   Social History Main Topics  . Smoking status: Former Smoker -- 0.3 packs/day for 10 years    Types: Cigarettes    Quit date: 02/03/1975  . Smokeless tobacco: Never Used  . Alcohol Use: No  . Drug Use: Not on file  . Sexually Active: Not on file   Other Topics Concern  . Not on file   Social History  Narrative  . No narrative on file     No Known Allergies   Outpatient Prescriptions Prior to Visit  Medication Sig Dispense Refill  . albuterol (PROVENTIL) (2.5 MG/3ML) 0.083% nebulizer solution Take 2.5 mg by nebulization every 6 (six) hours as needed.      Marland Kitchen atorvastatin (LIPITOR) 20 MG tablet Take 1 tablet (20 mg total) by mouth daily. Pt needs appt for more refills.  90 tablet  0  . irbesartan (AVAPRO) 300 MG tablet Take 300 mg by mouth at bedtime.      Marland Kitchen omeprazole (PRILOSEC) 20 MG capsule Take 20 mg by mouth daily.      . SYMBICORT 160-4.5 MCG/ACT inhaler INHALE TWO PUFFS TWO TIMES A DAY  3 Inhaler  0  . VENTOLIN HFA 108 (90 BASE) MCG/ACT inhaler INHALE 1-2 PUFFS FOUR TIMES DAILY AS NEEDED  36 each  0  . Vitamin D, Ergocalciferol, (DRISDOL) 50000 UNITS CAPS Take 50,000 Units by mouth every 7 (seven) days.      Marland Kitchen omalizumab (XOLAIR) 150 MG injection Inject 150 mg into the skin every 14 (fourteen) days.         Review of Systems Constitutional:   No  weight loss, night sweats,  Fevers, chills, fatigue, lassitude. HEENT:   No headaches,  Difficulty  swallowing,  Tooth/dental problems,  Sore throat,                No sneezing, itching, ear ache, nasal congestion, post nasal drip,   CV:  No chest pain,  Orthopnea, PND, swelling in lower extremities, anasarca, dizziness, palpitations  GI  No heartburn, indigestion, abdominal pain, nausea, vomiting, diarrhea, change in bowel habits, loss of appetite  Resp: No shortness of breath with exertion or at rest.  No excess mucus, no productive cough,  No non-productive cough,  No coughing up of blood.  No change in color of mucus.  No wheezing.  No chest wall deformity  Skin: no rash or lesions.  GU: no dysuria, change in color of urine, no urgency or frequency.  No flank pain.  MS:  No joint pain or swelling.  No decreased range of motion.  No back pain.  Psych:  No change in mood or affect. No depression or anxiety.  No memory loss.       Objective:   Physical Exam  Filed Vitals:   04/27/11 1510  BP: 148/70  Pulse: 60  Temp: 98.3 F (36.8 C)  TempSrc: Oral  Height: 5\' 4"  (1.626 m)  Weight: 173 lb 12.8 oz (78.835 kg)  SpO2: 97%    Gen: Pleasant, well-nourished, in no distress,  normal affect  ENT: No lesions,  mouth clear,  oropharynx clear, no postnasal drip  Neck: No JVD, no TMG, no carotid bruits  Lungs: No use of accessory muscles, no dullness to percussion, clear without rales or rhonchi  Cardiovascular: RRR, heart sounds normal, no murmur or gallops, no peripheral edema  Abdomen: soft and NT, no HSM,  BS normal  Musculoskeletal: No deformities, no cyanosis or clubbing  Neuro: alert, non focal  Skin: Warm, no lesions or rashes         Assessment & Plan:   Extrinsic asthma, unspecified Severe persistent asthma with major precipitating factors including allergic factors and reflux disease now improved with significant weight loss and attention to reflux diet Plan Maintain inhaled medications as prescribed Xolair has been discontinued    Updated Medication List Outpatient Encounter Prescriptions as of 04/27/2011  Medication Sig Dispense Refill  . albuterol (PROVENTIL) (2.5 MG/3ML) 0.083% nebulizer solution Take 2.5 mg by nebulization every 6 (six) hours as needed.      Marland Kitchen atorvastatin (LIPITOR) 20 MG tablet Take 1 tablet (20 mg total) by mouth daily. Pt needs appt for more refills.  90 tablet  0  . irbesartan (AVAPRO) 300 MG tablet Take 300 mg by mouth at bedtime.      Marland Kitchen omeprazole (PRILOSEC) 20 MG capsule Take 20 mg by mouth daily.      . SYMBICORT 160-4.5 MCG/ACT inhaler INHALE TWO PUFFS TWO TIMES A DAY  3 Inhaler  0  . VENTOLIN HFA 108 (90 BASE) MCG/ACT inhaler INHALE 1-2 PUFFS FOUR TIMES DAILY AS NEEDED  36 each  0  . Vitamin D, Ergocalciferol, (DRISDOL) 50000 UNITS CAPS Take 50,000 Units by mouth every 7 (seven) days.      Marland Kitchen DISCONTD: omalizumab (XOLAIR) 150 MG injection Inject 150 mg  into the skin every 14 (fourteen) days.

## 2011-04-27 NOTE — Patient Instructions (Signed)
No change in medications. Return in          12 months Keep up the AWESOME work!!!

## 2011-04-27 NOTE — Assessment & Plan Note (Signed)
Severe persistent asthma with major precipitating factors including allergic factors and reflux disease now improved with significant weight loss and attention to reflux diet Plan Maintain inhaled medications as prescribed Xolair has been discontinued

## 2011-05-28 ENCOUNTER — Other Ambulatory Visit: Payer: Self-pay | Admitting: Critical Care Medicine

## 2011-05-28 NOTE — Telephone Encounter (Signed)
Received a refill request for Singulair for pt.  Reviewed pt's MAR and Singulair is not listed as one of her current meds.   PW, please advise if ok to refil or not.  Thank you.

## 2011-06-04 ENCOUNTER — Other Ambulatory Visit: Payer: Self-pay | Admitting: Critical Care Medicine

## 2011-10-30 ENCOUNTER — Encounter: Payer: Self-pay | Admitting: Internal Medicine

## 2012-05-10 ENCOUNTER — Emergency Department (HOSPITAL_COMMUNITY)
Admission: EM | Admit: 2012-05-10 | Discharge: 2012-05-10 | Disposition: A | Discharge status: Home / Self Care | Payer: 59 | Attending: Emergency Medicine | Admitting: Emergency Medicine

## 2012-05-10 ENCOUNTER — Encounter (HOSPITAL_COMMUNITY): Payer: Self-pay | Admitting: *Deleted

## 2012-05-10 ENCOUNTER — Emergency Department (HOSPITAL_COMMUNITY): Payer: 59

## 2012-05-10 DIAGNOSIS — Z860100 Personal history of colon polyps, unspecified: Secondary | ICD-10-CM | POA: Insufficient documentation

## 2012-05-10 DIAGNOSIS — I10 Essential (primary) hypertension: Secondary | ICD-10-CM | POA: Insufficient documentation

## 2012-05-10 DIAGNOSIS — Z8719 Personal history of other diseases of the digestive system: Secondary | ICD-10-CM | POA: Insufficient documentation

## 2012-05-10 DIAGNOSIS — IMO0002 Reserved for concepts with insufficient information to code with codable children: Secondary | ICD-10-CM | POA: Insufficient documentation

## 2012-05-10 DIAGNOSIS — Z8679 Personal history of other diseases of the circulatory system: Secondary | ICD-10-CM | POA: Insufficient documentation

## 2012-05-10 DIAGNOSIS — IMO0001 Reserved for inherently not codable concepts without codable children: Secondary | ICD-10-CM | POA: Insufficient documentation

## 2012-05-10 DIAGNOSIS — R509 Fever, unspecified: Secondary | ICD-10-CM | POA: Insufficient documentation

## 2012-05-10 DIAGNOSIS — Z9849 Cataract extraction status, unspecified eye: Secondary | ICD-10-CM | POA: Insufficient documentation

## 2012-05-10 DIAGNOSIS — Z79899 Other long term (current) drug therapy: Secondary | ICD-10-CM | POA: Insufficient documentation

## 2012-05-10 DIAGNOSIS — E669 Obesity, unspecified: Secondary | ICD-10-CM | POA: Insufficient documentation

## 2012-05-10 DIAGNOSIS — R0602 Shortness of breath: Secondary | ICD-10-CM | POA: Insufficient documentation

## 2012-05-10 DIAGNOSIS — J45909 Unspecified asthma, uncomplicated: Secondary | ICD-10-CM | POA: Insufficient documentation

## 2012-05-10 DIAGNOSIS — E785 Hyperlipidemia, unspecified: Secondary | ICD-10-CM | POA: Insufficient documentation

## 2012-05-10 DIAGNOSIS — J40 Bronchitis, not specified as acute or chronic: Secondary | ICD-10-CM | POA: Insufficient documentation

## 2012-05-10 DIAGNOSIS — Z87891 Personal history of nicotine dependence: Secondary | ICD-10-CM | POA: Insufficient documentation

## 2012-05-10 LAB — CBC
HCT: 38.6 % (ref 36.0–46.0)
Hemoglobin: 13.5 g/dL (ref 12.0–15.0)
MCH: 30.8 pg (ref 26.0–34.0)
MCHC: 35 g/dL (ref 30.0–36.0)
RDW: 13.1 % (ref 11.5–15.5)

## 2012-05-10 MED ORDER — ALBUTEROL SULFATE (5 MG/ML) 0.5% IN NEBU
5.0000 mg | INHALATION_SOLUTION | Freq: Once | RESPIRATORY_TRACT | Status: AC
  Administered 2012-05-10: 5 mg via RESPIRATORY_TRACT
  Filled 2012-05-10: qty 1

## 2012-05-10 MED ORDER — ALBUTEROL SULFATE (2.5 MG/3ML) 0.083% IN NEBU: 2.5000 mg | INHALATION_SOLUTION | Freq: Four times a day (QID) | RESPIRATORY_TRACT | Status: DC | PRN

## 2012-05-10 MED ORDER — PREDNISONE 20 MG PO TABS
60.0000 mg | ORAL_TABLET | Freq: Once | ORAL | Status: AC
  Administered 2012-05-10: 60 mg via ORAL
  Filled 2012-05-10: qty 3

## 2012-05-10 MED ORDER — MOXIFLOXACIN HCL 400 MG PO TABS: 400.0000 mg | ORAL_TABLET | Freq: Every day | ORAL | Status: AC

## 2012-05-10 MED ORDER — PREDNISONE 20 MG PO TABS: 60.0000 mg | ORAL_TABLET | Freq: Every day | ORAL | Status: AC

## 2012-05-10 NOTE — ED Notes (Addendum)
Pt with hx of asthma to ED c/o sob.  Exp wheezes, rr of 26, though sats of 97%.  States green, productive cough for several days.  Hx of cad in chart which pt denies.

## 2012-05-10 NOTE — ED Provider Notes (Signed)
History     CSN: [id number]  Arrival date & time 05/10/12  1348   First MD Initiated Contact with Patient 05/10/12 1430     Chief Complaint  Patient presents with  . Cough   HPI  67 y/o female with history of asthma and hypertension who presents with a four day history of productive cough, wheezing, shortness of breath, and fever. The patient states her symptoms began approximately 4 days ago while visiting her daughter in [place]. She states her symptoms have gradually worsened. She has been having fevers at home with a temperature as high as 102. She has been taking ibuprofen with some relief.   Past Medical History  Diagnosis Date  . Asthma   . Hyperlipidemia   . Hypertension   . Tubular adenoma   . Colon polyps   . Obesity   . Prinzmetal angina     Past Surgical History  Procedure Laterality Date  . Appendectomy    . Vesicovaginal fistula closure w/ tah    . Tonsillectomy    . Cholecystectomy    . Cataract extraction, bilateral  2011  . Esophagogastroduodenoscopy  2011    Family History  Problem Relation Age of Onset  . Asthma      all children  . Diabetes Maternal Grandfather   . Diabetes Maternal Grandmother   . Diabetes Brother   . Heart disease Mother   . Heart disease Brother   . Heart disease Father     History  Substance Use Topics  . Smoking status: Former Smoker -- 0.30 packs/day for 10 years    Types: Cigarettes    Quit date: 02/03/1975  . Smokeless tobacco: Never Used  . Alcohol Use: No    OB History   Grav Para Term Preterm Abortions TAB SAB Ect Mult Living                 Review of Systems  Constitutional: Positive for fever and chills.  HENT: Positive for congestion.   Respiratory: Positive for cough, shortness of breath and wheezing.   Cardiovascular: Negative for chest pain.  Gastrointestinal: Negative for nausea, vomiting and abdominal pain.  Genitourinary: Negative for dysuria and frequency.  Musculoskeletal: Positive for  myalgias.  All other systems reviewed and are negative.   Allergies  Review of patient's allergies indicates no known allergies.  Home Medications   Current Outpatient Rx  Name  Route  Sig  Dispense  Refill  . acetaminophen (TYLENOL) 325 MG tablet   Oral   Take 650 mg by mouth every 6 (six) hours as needed for fever (alternating motrin).         Marland Kitchen albuterol (PROVENTIL HFA;VENTOLIN HFA) 108 (90 BASE) MCG/ACT inhaler   Inhalation   Inhale 1-2 puffs into the lungs 4 (four) times daily as needed for wheezing or shortness of breath.         Marland Kitchen albuterol (PROVENTIL) (2.5 MG/3ML) 0.083% nebulizer solution   Nebulization   Take 2.5 mg by nebulization every 6 (six) hours as needed for wheezing or shortness of breath.          Marland Kitchen atorvastatin (LIPITOR) 20 MG tablet   Oral   Take 20 mg by mouth daily.         . budesonide-formoterol (SYMBICORT) 160-4.5 MCG/ACT inhaler   Inhalation   Inhale 2 puffs into the lungs 2 (two) times daily.         . hydrochlorothiazide (MICROZIDE) 12.5 MG capsule   Oral  Take 12.5 mg by mouth daily.         Marland Kitchen ibuprofen (ADVIL,MOTRIN) 200 MG tablet   Oral   Take 400 mg by mouth every 8 (eight) hours as needed for fever (alternating with tylenol).         . irbesartan (AVAPRO) 300 MG tablet   Oral   Take 300 mg by mouth daily.          . montelukast (SINGULAIR) 10 MG tablet   Oral   Take 10 mg by mouth daily.         . Vitamin D, Ergocalciferol, (DRISDOL) 50000 UNITS CAPS   Oral   Take 50,000 Units by mouth every 7 (seven) days. On Monday         . albuterol (PROVENTIL) (2.5 MG/3ML) 0.083% nebulizer solution   Nebulization   Take 3 mLs (2.5 mg total) by nebulization every 6 (six) hours as needed for wheezing.   75 mL   12   . moxifloxacin (AVELOX) 400 MG tablet   Oral   Take 1 tablet (400 mg total) by mouth daily.   7 tablet   0   . predniSONE (DELTASONE) 20 MG tablet   Oral   Take 3 tablets (60 mg total) by mouth  daily.   12 tablet   0     BP 141/55  Pulse 69  Temp(Src) 98.2 F (36.8 C) (Oral)  Resp 18  SpO2 99%  Physical Exam  Nursing note and vitals reviewed. Constitutional: She is oriented to person, place, and time. She appears well-developed and well-nourished. No distress.  HENT:  Head: Normocephalic and atraumatic.  Eyes: Conjunctivae are normal. Pupils are equal, round, and reactive to light.  Neck: Normal range of motion. Neck supple.  Cardiovascular: Normal rate and regular rhythm.  Exam reveals no gallop and no friction rub.   No murmur heard. Pulmonary/Chest: Effort normal. No accessory muscle usage. Not tachypneic. No respiratory distress. She has wheezes (diffuse mild expiratory wheeze).  Abdominal: Soft. She exhibits no distension. There is no tenderness.  Musculoskeletal: Normal range of motion. She exhibits no edema and no tenderness.  Neurological: She is alert and oriented to person, place, and time.  Skin: Skin is warm and dry.  Psychiatric: She has a normal mood and affect.    ED Course  Procedures (including critical care time)  Labs Reviewed  CBC   Dg Chest 2 View (if Patient Has Fever And/or Copd)  05/10/2012  *RADIOLOGY REPORT*  Clinical Data: Cough, wheezing  CHEST - 2 VIEW  Comparison: 08/01/2007  Findings: Cardiomediastinal silhouette is stable.  Central mild bronchitic changes.  Mild interstitial prominence bilaterally without convincing pulmonary edema.  No segmental infiltrate.  IMPRESSION: 1.  Mild interstitial prominence bilaterally without convincing pulmonary edema.  Central mild bronchitic changes.  No segmental infiltrate.   Original Report Authenticated By: Natasha Mead, M.D.    1. Bronchitis     MDM   67 y/o female with history of asthma and hypertension who presents with a four day history of productive cough, wheezing, shortness of breath, and fever. CXR without focal consolidation. CBC within normal limits. Faint diffuse expiratory wheeze on  exam. Tachypnea resolved with nebulizer. After neb the patient is able to speak and talk in full sentences and feels much better. Likely acute bronchitis. The patient was discharged home with refill of home nebulizer solution, prednisone, and avelox. Return precautions given and addressed with the patient who was in agreement with the plan.  Shanon Ace, MD 05/11/12 [phone]

## 2012-05-11 NOTE — ED Provider Notes (Signed)
I saw and evaluated the patient, reviewed the resident's note and I agree with the findings and plan.  Pt feels much better, dc home in good condition. Home with abx   Lyanne Co, MD 05/11/12 [phone]

## 2012-06-01 ENCOUNTER — Other Ambulatory Visit: Payer: Self-pay | Admitting: Critical Care Medicine

## 2012-06-01 NOTE — Telephone Encounter (Signed)
Pt was last seen by PW on 04/27/11 and was asked to f/u in 1 yr. No pending OV. Singulair Rx was sent to pharm for a 90 day supply as requested through rx request. I have lmomtcb for pt to ask her to pls schedule OV with PW.

## 2012-06-02 NOTE — Telephone Encounter (Signed)
lmomtcb  

## 2012-06-03 NOTE — Telephone Encounter (Signed)
lmomtcb for pt 

## 2012-06-07 ENCOUNTER — Telehealth: Payer: Self-pay | Admitting: Critical Care Medicine

## 2012-06-07 NOTE — Telephone Encounter (Signed)
LMTCB

## 2012-06-07 NOTE — Telephone Encounter (Signed)
LM with pt. I was calling her this morning to schedule a ROV with PW. We had received a refill request for Singulair #[phone]. Crystal had tried to contact her to schedule a ROV because we had not seen her since 04/27/2011. She has a ROV already scheduled for 07/18/2012. Nothing further was needed.

## 2012-06-07 NOTE — Telephone Encounter (Signed)
ROV has been scheduled for 07/18/2012.

## 2012-06-20 ENCOUNTER — Ambulatory Visit
Admission: RE | Admit: 2012-06-20 | Discharge: 2012-06-20 | Disposition: A | Discharge status: Home / Self Care | Payer: No Typology Code available for payment source | Source: Ambulatory Visit | Attending: Family Medicine | Admitting: Family Medicine

## 2012-06-20 ENCOUNTER — Other Ambulatory Visit: Payer: Self-pay | Admitting: Family Medicine

## 2012-06-20 DIAGNOSIS — Z006 Encounter for examination for normal comparison and control in clinical research program: Secondary | ICD-10-CM

## 2012-07-18 ENCOUNTER — Ambulatory Visit (INDEPENDENT_AMBULATORY_CARE_PROVIDER_SITE_OTHER): Payer: 59 | Admitting: Critical Care Medicine

## 2012-07-18 ENCOUNTER — Other Ambulatory Visit: Payer: 59

## 2012-07-18 ENCOUNTER — Encounter: Payer: Self-pay | Admitting: Critical Care Medicine

## 2012-07-18 VITALS — BP 124/70 | HR 60 | Temp 97.8°F | Ht 64.0 in | Wt 188.0 lb

## 2012-07-18 DIAGNOSIS — J45909 Unspecified asthma, uncomplicated: Secondary | ICD-10-CM

## 2012-07-18 DIAGNOSIS — J45902 Unspecified asthma with status asthmaticus: Secondary | ICD-10-CM

## 2012-07-18 MED ORDER — PREDNISONE 10 MG PO TABS: ORAL_TABLET | ORAL | Status: DC

## 2012-07-18 MED ORDER — MOMETASONE FURO-FORMOTEROL FUM 200-5 MCG/ACT IN AERO: 2.0000 | INHALATION_SPRAY | Freq: Two times a day (BID) | RESPIRATORY_TRACT | Status: DC

## 2012-07-18 NOTE — Progress Notes (Signed)
Subjective:    Patient ID: Elaine Luna, female    DOB: 1945-07-22, 67 y.o.   MRN: [id number]  HPI This is a 67 y.o., white female with severe persistent asthma.    07/18/2012 Ex smoker 1pp/wk for 32yrs Now has a lot of mucus. Using proventil about 2-3 x per day Pt is worse with dyspnea. Pt notes some coughing Notes some wheezing worse in the AM. Pt stopped Xolair  PUL ASTHMA HISTORY 07/18/2012 04/27/2011  Symptoms Daily 0-2 days/week  Nighttime awakenings 0-2/month 0-2/month  Interference with activity Minor limitations No limitations  SABA use Several times/day 0-2 days/wk  Exacerbations requiring oral steroids 0-1 / year 0-1 / year    Review of Systems Constitutional:   No  weight loss, night sweats,  Fevers, chills, fatigue, lassitude. HEENT:   No headaches,  Difficulty swallowing,  Tooth/dental problems,  Sore throat,                No sneezing, itching, ear ache, nasal congestion, post nasal drip,   CV:  No chest pain,  Orthopnea, PND, swelling in lower extremities, anasarca, dizziness, palpitations  GI  No heartburn, indigestion, abdominal pain, nausea, vomiting, diarrhea, change in bowel habits, loss of appetite  Resp: wheezing and dyspnea  Skin: no rash or lesions.  GU: no dysuria, change in color of urine, no urgency or frequency.  No flank pain.  MS:  No joint pain or swelling.  No decreased range of motion.  No back pain.  Psych:  No change in mood or affect. No depression or anxiety.  No memory loss.     Objective:   Physical Exam Filed Vitals:   07/18/12 1417  BP: 124/70  Pulse: 60  Temp: 97.8 F (36.6 C)  TempSrc: Oral  Height: 5\' 4"  (1.626 m)  Weight: 188 lb (85.276 kg)  SpO2: 97%    Gen: Pleasant, well-nourished, in no distress,  normal affect  ENT: No lesions,  mouth clear,  oropharynx clear, no postnasal drip  Neck: No JVD, no TMG, no carotid bruits  Lungs: No use of accessory muscles, no dullness to percussion, exp  wheeze  Cardiovascular: RRR, heart sounds normal, no murmur or gallops, no peripheral edema  Abdomen: soft and NT, no HSM,  BS normal  Musculoskeletal: No deformities, no cyanosis or clubbing  Neuro: alert, non focal  Skin: Warm, no lesions or rashes  No results found.        Assessment & Plan:   Severe persistent asthma with acute exacerbation Severe persistent asthma with recurrent flare and atopic features Plan Prednisone 10mg  Take 4 for three days 3 for three days 2 for three days 1 for three days and stop Dulera 200 two puff twice daily  Stop Symbicort Allergy blood test ? Restart Xolair No other medication changes Return 6 weeks    Updated Medication List Outpatient Encounter Prescriptions as of 07/18/2012  Medication Sig Dispense Refill  . acetaminophen (TYLENOL) 325 MG tablet Take 650 mg by mouth every 6 (six) hours as needed for fever (alternating motrin).      Marland Kitchen albuterol (PROVENTIL HFA;VENTOLIN HFA) 108 (90 BASE) MCG/ACT inhaler Inhale 1-2 puffs into the lungs 4 (four) times daily as needed for wheezing or shortness of breath.      Marland Kitchen albuterol (PROVENTIL) (2.5 MG/3ML) 0.083% nebulizer solution Take 3 mLs (2.5 mg total) by nebulization every 6 (six) hours as needed for wheezing.  75 mL  12  . atorvastatin (LIPITOR) 20 MG tablet Take 20 mg  by mouth daily.      . hydrochlorothiazide (MICROZIDE) 12.5 MG capsule Take 12.5 mg by mouth daily.      Marland Kitchen ibuprofen (ADVIL,MOTRIN) 200 MG tablet Take 400 mg by mouth every 8 (eight) hours as needed for fever (alternating with tylenol).      . irbesartan (AVAPRO) 300 MG tablet Take 300 mg by mouth daily.       . montelukast (SINGULAIR) 10 MG tablet Take 10 mg by mouth daily.      . Vitamin D, Ergocalciferol, (DRISDOL) 50000 UNITS CAPS Take 50,000 Units by mouth every 7 (seven) days. On Monday      . [DISCONTINUED] albuterol (PROVENTIL) (2.5 MG/3ML) 0.083% nebulizer solution Take 2.5 mg by nebulization every 6 (six) hours as  needed for wheezing or shortness of breath.       . [DISCONTINUED] budesonide-formoterol (SYMBICORT) 160-4.5 MCG/ACT inhaler Inhale 2 puffs into the lungs 2 (two) times daily.      . mometasone-formoterol (DULERA) 200-5 MCG/ACT AERO Inhale 2 puffs into the lungs 2 (two) times daily.  1 Inhaler  11  . predniSONE (DELTASONE) 10 MG tablet Take 4 for three days 3 for three days 2 for three days 1 for three days and stop  30 tablet  0  . [DISCONTINUED] montelukast (SINGULAIR) 10 MG tablet TAKE 1 TABLET BY MOUTH DAILY  90 tablet  0   No facility-administered encounter medications on file as of 07/18/2012.

## 2012-07-18 NOTE — Patient Instructions (Addendum)
Prednisone 10mg  Take 4 for three days 3 for three days 2 for three days 1 for three days and stop Dulera 200 two puff twice daily  Stop Symbicort Allergy blood test ? Restart Xolair No other medication changes Return 6 weeks

## 2012-07-19 LAB — ALLERGY FULL PROFILE
Allergen,Goose feathers, e70: <0.1 kU/L
Bermuda Grass: <0.1 kU/L
Box Elder IgE: <0.1 kU/L
Candida Albicans: <0.1 kU/L
Curvularia lunata: <0.1 kU/L
D. farinae: <0.1 kU/L
Elm IgE: <0.1 kU/L
Fescue: <0.1 kU/L
G005 Rye, Perennial: <0.1 kU/L
G009 Red Top: <0.1 kU/L
IgE (Immunoglobulin E), Serum: 30.7 IU/mL (ref 0.0–180.0)
Oak: <0.1 kU/L
Plantain: <0.1 kU/L
Stemphylium Botryosum: <0.1 kU/L
Timothy Grass: <0.1 kU/L

## 2012-07-19 NOTE — Assessment & Plan Note (Signed)
Severe persistent asthma with recurrent flare and atopic features Plan Prednisone 10mg  Take 4 for three days 3 for three days 2 for three days 1 for three days and stop Dulera 200 two puff twice daily  Stop Symbicort Allergy blood test ? Restart Xolair No other medication changes Return 6 weeks

## 2012-08-29 ENCOUNTER — Other Ambulatory Visit: Payer: Self-pay | Admitting: Critical Care Medicine

## 2012-09-02 ENCOUNTER — Ambulatory Visit (INDEPENDENT_AMBULATORY_CARE_PROVIDER_SITE_OTHER): Payer: 59 | Admitting: Critical Care Medicine

## 2012-09-02 ENCOUNTER — Encounter: Payer: Self-pay | Admitting: Critical Care Medicine

## 2012-09-02 VITALS — BP 132/60 | HR 66 | Temp 98.1°F | Ht 64.0 in | Wt 193.2 lb

## 2012-09-02 DIAGNOSIS — J45909 Unspecified asthma, uncomplicated: Secondary | ICD-10-CM

## 2012-09-02 DIAGNOSIS — J4550 Severe persistent asthma, uncomplicated: Secondary | ICD-10-CM

## 2012-09-02 NOTE — Progress Notes (Signed)
Subjective:    Patient ID: Elaine Luna, female    DOB: 1945/12/05, 67 y.o.   MRN: [id number]  HPI Elaine Luna Severe persistent asthma. Elaine Luna is working great. Only uses albuterol 1-2/wk. Occational coughing at night, but not terrible. Tapered off the steroids from last visit - feeling great. Allergy blood test - never got results. Not doing the xolair shots any more.     Review of Systems  Constitutional: Negative for fever, chills, diaphoresis and fatigue.  HENT: Negative for rhinorrhea, sneezing, postnasal drip and sinus pressure.   Respiratory: Positive for shortness of breath. Negative for cough.        Objective:   Physical Exam  VITAL SIGNS:  BP 132/60  Pulse 66  Temp(Src) 98.1 F (36.7 C) (Oral)  Ht 5\' 4"  (1.626 m)  Wt 193 lb 3.2 oz (87.635 kg)  BMI 33.15 kg/m2  SpO2 96%  HEENT: Head is normocephalic and atraumatic. Extraocular muscles are intact. Pupils are equal, round, and reactive to light and accommodation. Nares appeared normal. Mouth is well hydrated and without lesions. Mucous membranes are moist. Posterior pharynx clear of any exudate or lesions. NECK: Supple. No carotid bruits. No lymphadenopathy or thyromegaly. LUNGS: Clear to auscultation. HEART: Regular rate and rhythm without murmur.  ABDOMEN: Soft, nontender, and nondistended. Positive bowel sounds. No hepatosplenomegaly was noted.  EXTREMITIES: Without any cyanosis, clubbing, rash, lesions or edema.  NEUROLOGIC: Cranial nerves II through XII are grossly intact.  SKIN: No ulceration or induration present.        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 Return in 4 months Hold off on xolair with low IgE level    Updated Medication List Outpatient Encounter Prescriptions as of 09/02/2012  Medication Sig  Dispense Refill  . acetaminophen (TYLENOL) 325 MG tablet Take 650 mg by mouth every 6 (six) hours as needed for fever (alternating motrin).      Marland Kitchen albuterol (PROVENTIL) (2.5 MG/3ML) 0.083% nebulizer solution Take 3 mLs (2.5 mg total) by nebulization every 6 (six) hours as needed for wheezing.  75 mL  12  . atorvastatin (LIPITOR) 20 MG tablet Take 20 mg by mouth daily.      . hydrochlorothiazide (MICROZIDE) 12.5 MG capsule Take 12.5 mg by mouth daily.      Marland Kitchen ibuprofen (ADVIL,MOTRIN) 200 MG tablet Take 400 mg by mouth every 8 (eight) hours as needed for fever (alternating with tylenol).      . irbesartan (AVAPRO) 300 MG tablet Take 300 mg by mouth daily.       . mometasone-formoterol (DULERA) 200-5 MCG/ACT AERO Inhale 2 puffs into the lungs 2 (two) times daily.  1 Inhaler  11  . montelukast (SINGULAIR) 10 MG tablet Take 10 mg by mouth daily.      . VENTOLIN HFA 108 (90 BASE) MCG/ACT inhaler INHALE 1-2 PUFFS FOUR TIMES DAILY AS NEEDED  1 Inhaler  5  . Vitamin D, Ergocalciferol, (DRISDOL) 50000 UNITS CAPS Take 50,000 Units by mouth every 7 (seven) days. On Monday      . [DISCONTINUED] albuterol (PROVENTIL HFA;VENTOLIN HFA) 108 (90 BASE) MCG/ACT inhaler Inhale 1-2 puffs into the lungs 4 (four) times daily as needed for wheezing or shortness of breath.      . [DISCONTINUED] predniSONE (DELTASONE) 10 MG tablet Take 4 for three days 3 for three days 2 for  three days 1 for three days and stop  30 tablet  0   No facility-administered encounter medications on file as of 09/02/2012.

## 2012-09-02 NOTE — Patient Instructions (Addendum)
No change in inhaled or maintenance medications. Return in  6 months 

## 2012-09-04 NOTE — Assessment & Plan Note (Signed)
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 Return in 4 months Hold off on xolair with low IgE level

## 2012-09-12 ENCOUNTER — Other Ambulatory Visit: Payer: Self-pay | Admitting: Critical Care Medicine

## 2012-12-23 ENCOUNTER — Other Ambulatory Visit (HOSPITAL_COMMUNITY): Payer: Self-pay | Admitting: Diagnostic Radiology

## 2012-12-23 DIAGNOSIS — Z1231 Encounter for screening mammogram for malignant neoplasm of breast: Secondary | ICD-10-CM

## 2013-02-02 DIAGNOSIS — E119 Type 2 diabetes mellitus without complications: Secondary | ICD-10-CM

## 2013-02-02 HISTORY — DX: Type 2 diabetes mellitus without complications: E11.9

## 2013-03-28 ENCOUNTER — Ambulatory Visit
Admission: RE | Admit: 2013-03-28 | Discharge: 2013-03-28 | Disposition: A | Discharge status: Home / Self Care | Payer: 59 | Source: Ambulatory Visit | Attending: Diagnostic Radiology | Admitting: Diagnostic Radiology

## 2013-03-28 DIAGNOSIS — Z1231 Encounter for screening mammogram for malignant neoplasm of breast: Secondary | ICD-10-CM

## 2013-06-03 ENCOUNTER — Emergency Department (HOSPITAL_COMMUNITY)
Admission: EM | Admit: 2013-06-03 | Discharge: 2013-06-03 | Disposition: A | Discharge status: Home / Self Care | Payer: 59 | Source: Home / Self Care | Attending: Emergency Medicine | Admitting: Emergency Medicine

## 2013-06-03 ENCOUNTER — Emergency Department (INDEPENDENT_AMBULATORY_CARE_PROVIDER_SITE_OTHER): Payer: 59

## 2013-06-03 ENCOUNTER — Encounter (HOSPITAL_COMMUNITY): Payer: Self-pay | Admitting: Emergency Medicine

## 2013-06-03 DIAGNOSIS — J45901 Unspecified asthma with (acute) exacerbation: Secondary | ICD-10-CM

## 2013-06-03 MED ORDER — PREDNISONE 20 MG PO TABS: 20.0000 mg | ORAL_TABLET | Freq: Three times a day (TID) | ORAL | Status: DC

## 2013-06-03 MED ORDER — IPRATROPIUM-ALBUTEROL 0.5-2.5 (3) MG/3ML IN SOLN
3.0000 mL | Freq: Once | RESPIRATORY_TRACT | Status: AC
  Administered 2013-06-03: 3 mL via RESPIRATORY_TRACT

## 2013-06-03 MED ORDER — AMOXICILLIN-POT CLAVULANATE 875-125 MG PO TABS: 1.0000 | ORAL_TABLET | Freq: Two times a day (BID) | ORAL | Status: DC

## 2013-06-03 MED ORDER — PREDNISONE 20 MG PO TABS
60.0000 mg | ORAL_TABLET | Freq: Once | ORAL | Status: AC
  Administered 2013-06-03: 60 mg via ORAL

## 2013-06-03 MED ORDER — ALBUTEROL SULFATE (2.5 MG/3ML) 0.083% IN NEBU
INHALATION_SOLUTION | RESPIRATORY_TRACT | Status: AC
  Filled 2013-06-03: qty 3

## 2013-06-03 MED ORDER — PREDNISONE 20 MG PO TABS
ORAL_TABLET | ORAL | Status: AC
Start: 2013-06-03 — End: 2013-06-03
  Filled 2013-06-03: qty 3

## 2013-06-03 MED ORDER — IPRATROPIUM-ALBUTEROL 0.5-2.5 (3) MG/3ML IN SOLN
RESPIRATORY_TRACT | Status: AC
  Filled 2013-06-03: qty 3

## 2013-06-03 MED ORDER — ALBUTEROL SULFATE (2.5 MG/3ML) 0.083% IN NEBU
5.0000 mg | INHALATION_SOLUTION | Freq: Once | RESPIRATORY_TRACT | Status: AC
  Administered 2013-06-03: 5 mg via RESPIRATORY_TRACT

## 2013-06-03 NOTE — Discharge Instructions (Signed)

## 2013-06-03 NOTE — ED Provider Notes (Signed)
Chief Complaint   No chief complaint on file.   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 She has had asthma since she was 68 years old. She was intubated once when she was 68 years old. She's followed by Dr. Saralyn Pilar right. She's been on one or 2 courses of steroids this past year and has been to the emergency room department once in the past year. She's not been hospitalized in the past year. She has had a two-day history of flareup of her asthma with coughing with green mucus, wheezing, headache, ear congestion, nasal congestion. She denies any fever, chills, chest pain, or sore throat. She controls her asthma with Proventil and Symbicort.  Review of Systems   Other than as noted above, the patient denies any of the following symptoms. Systemic:  No fever, chills, or sweats. ENT:  No nasal congestion, sneezing, rhinorrhea, or sore throat. Lungs:  No cough, sputum production, or shortness of breath. No chest pain. Skin:  No rash or itching.  [place]   Past medical history, family history, social history, meds, and allergies were reviewed. She also has high blood pressure and elevated cholesterol. She takes Avapro, Lipitor, Singulair, and HCTZ.  Physical Examination    Vital signs:  BP 188/68  Pulse 81  Temp(Src) 97.6 F (36.4 C) (Oral)  Resp 20  Ht 5\' 4"  (1.626 m)  Wt 188 lb (85.276 kg)  BMI 32.25 kg/m2  SpO2 99% General:  Alert, in no distress. Able to speak in full sentences. Eye:  No conjunctival injection or drainage. Lids were normal. ENT:  TMs and canals were normal, without erythema or inflammation.  Nasal mucosa was clear and uncongested, without drainage.  Mucous membranes were moist.  Pharynx was clear, without exudate or drainage.  There were no oral ulcerations or lesions. Neck:  Supple, no adenopathy, tenderness or mass. Lungs:  No retractions or use of  accessory muscles.  No respiratory distress.  She has bilateral expiratory wheezes with good air movement, no rales or rhonchi. Heart:  Regular rhythm, without gallops, murmers or rubs. Skin:  Clear, warm, and dry, without rash or lesions.  Radiology   Dg Chest 2 View  06/03/2013   CLINICAL DATA:  Cough and wheezing.  EXAM: CHEST  2 VIEW  COMPARISON:  DG THORACIC SPINE 1 VIEW dated 06/20/2012; DG CHEST 2 VIEW dated 05/10/2012  FINDINGS: Mediastinum and hilar structures normal. Lungs are clear. Cardiomegaly. Normal pulmonary vascularity. No pleural effusion or pneumothorax. Degenerative changes thoracic spine.  IMPRESSION: No active cardiopulmonary disease.   Electronically Signed   By: Marcello Moores  Register   On: 06/03/2013 14:31    Course in Urgent [place]   She was given a DuoNeb breathing treatment and prednisone 60 mg by mouth. Thereafter she felt better and her lungs were clear and wheeze free.  Assessment   The encounter diagnosis was Asthma attack.  Plan    1.  Meds:  The following meds were prescribed:   Discharge Medication List as of 06/03/2013  3:08 PM    START taking these medications   Details  amoxicillin-clavulanate (AUGMENTIN) 875-125 MG per tablet Take 1 tablet by mouth 2 (two) times daily., Starting 06/03/2013, Until Discontinued, Normal    predniSONE (DELTASONE) 20 MG tablet Take 1 tablet (20 mg total) by mouth 3 (three) times daily., Starting 06/03/2013, Until Discontinued, Normal  2.  Patient Education/Counseling:  The patient was given appropriate handouts, self care instructions, and instructed in symptomatic relief.    3.  Follow up:  The patient was told to follow up here if no better in 2 days, or sooner if becoming worse in any way, and given some red flag symptoms such as increasing difficulty breathing which would prompt immediate return.         Harden Mo, MD 06/03/13 [phone]

## 2013-06-03 NOTE — ED Notes (Signed)
C/o cough-dry/green; chest congestion; sob; ear clogged, HA x yesterday Denies;fever

## 2013-07-11 ENCOUNTER — Encounter: Payer: Self-pay | Admitting: Internal Medicine

## 2013-07-26 ENCOUNTER — Ambulatory Visit (INDEPENDENT_AMBULATORY_CARE_PROVIDER_SITE_OTHER): Payer: 59 | Admitting: Critical Care Medicine

## 2013-07-26 ENCOUNTER — Encounter: Payer: Self-pay | Admitting: Critical Care Medicine

## 2013-07-26 ENCOUNTER — Other Ambulatory Visit: Payer: 59

## 2013-07-26 VITALS — BP 132/84 | HR 52 | Temp 98.1°F | Ht 64.0 in | Wt 186.0 lb

## 2013-07-26 DIAGNOSIS — E119 Type 2 diabetes mellitus without complications: Secondary | ICD-10-CM | POA: Insufficient documentation

## 2013-07-26 DIAGNOSIS — J4550 Severe persistent asthma, uncomplicated: Secondary | ICD-10-CM

## 2013-07-26 DIAGNOSIS — J45909 Unspecified asthma, uncomplicated: Secondary | ICD-10-CM

## 2013-07-26 MED ORDER — BECLOMETHASONE DIPROPIONATE 80 MCG/ACT IN AERS: 2.0000 | INHALATION_SPRAY | Freq: Two times a day (BID) | RESPIRATORY_TRACT | Status: DC

## 2013-07-26 NOTE — Patient Instructions (Signed)
Stay on [facility] Qvar 80 two puff twice daily Allergy labs today We may consider restart of Xolair Return 2 months

## 2013-07-26 NOTE — Progress Notes (Signed)
Subjective:    Patient ID: Elaine Luna, female    DOB: 09-20-1945, 68 y.o.   MRN: [id number]  HPI 07/26/2013  Chief Complaint  Patient presents with  . Follow-up    Was on prednisone 3 different times during the spring d/t SOB, wheezing, and cough.  SOB and wheezing have resolved but still coughing with clear mucus.  No chest tightness/pain.    Pt had pred pulse 3 times, last dose > 1 week ago.   Now min cough now. Not much sinus issues, produces a lot of mucus.  Wheezing is better  Uses SABA once daily   Off xolair x 2 yrs  PUL ASTHMA HISTORY 07/26/2013 09/02/2012 07/18/2012 04/27/2011  Symptoms >2 days/week >2 days/week Daily 0-2 days/week  Nighttime awakenings 0-2/month 0-2/month 0-2/month 0-2/month  Interference with activity Minor limitations Some limitations Minor limitations No limitations  SABA use Daily 0-2 days/wk Several times/day 0-2 days/wk  Exacerbations requiring oral steroids 2 or more / year 2 or more / year 0-1 / year 0-1 / year      Review of Systems Constitutional:   No  weight loss, night sweats,  Fevers, chills, fatigue, lassitude. HEENT:   No headaches,  Difficulty swallowing,  Tooth/dental problems,  Sore throat,                No sneezing, itching, ear ache, nasal congestion, post nasal drip,   CV:  No chest pain,  Orthopnea, PND, swelling in lower extremities, anasarca, dizziness, palpitations  GI  No heartburn, indigestion, abdominal pain, nausea, vomiting, diarrhea, change in bowel habits, loss of appetite  Resp: No shortness of breath with exertion or at rest.  No excess mucus, no productive cough,  No non-productive cough,  No coughing up of blood.  No change in color of mucus.  No wheezing.  No chest wall deformity  Skin: no rash or lesions.  GU: no dysuria, change in color of urine, no urgency or frequency.  No flank pain.  MS:  No joint pain or swelling.  No decreased range of motion.  No back pain.  Psych:  No change in mood or affect. No  depression or anxiety.  No memory loss.     Objective:   Physical Exam Filed Vitals:   07/26/13 1112  BP: 132/84  Pulse: 52  Temp: 98.1 F (36.7 C)  TempSrc: Oral  Height: 5\' 4"  (1.626 m)  Weight: 186 lb (84.369 kg)  SpO2: 95%    Gen: Pleasant, well-nourished, in no distress,  normal affect  ENT: No lesions,  mouth clear,  oropharynx clear, no postnasal drip  Neck: No JVD, no TMG, no carotid bruits  Lungs: No use of accessory muscles, no dullness to percussion,exp wheezes   Cardiovascular: RRR, heart sounds normal, no murmur or gallops, no peripheral edema  Abdomen: soft and NT, no HSM,  BS normal  Musculoskeletal: No deformities, no cyanosis or clubbing  Neuro: alert, non focal  Skin: Warm, no lesions or rashes  No results found.        Assessment & Plan:   Severe persistent asthma, well-controlled Severe persistent asthma with poor control, Ig E >350 Plan Resume xolair Add qvar  Add Dulera   Updated Medication List Outpatient Encounter Prescriptions as of 07/26/2013  Medication Sig  . acetaminophen (TYLENOL) 325 MG tablet Take 650 mg by mouth every 6 (six) hours as needed for fever (alternating motrin).  Marland Kitchen albuterol (PROVENTIL) (2.5 MG/3ML) 0.083% nebulizer solution Take 3 mLs (2.5 mg  total) by nebulization every 6 (six) hours as needed for wheezing.  Marland Kitchen atorvastatin (LIPITOR) 20 MG tablet Take 20 mg by mouth daily.  . Denosumab (PROLIA [place]) Inject into the skin every 6 (six) months.  . hydrochlorothiazide (MICROZIDE) 12.5 MG capsule Take 12.5 mg by mouth daily.  Marland Kitchen ibuprofen (ADVIL,MOTRIN) 200 MG tablet Take 400 mg by mouth every 8 (eight) hours as needed for fever (alternating with tylenol).  . irbesartan (AVAPRO) 300 MG tablet Take 300 mg by mouth daily.   . mometasone-formoterol (DULERA) 200-5 MCG/ACT AERO Inhale 2 puffs into the lungs 2 (two) times daily.  . montelukast (SINGULAIR) 10 MG tablet TAKE 1 TABLET BY MOUTH DAILY  . VENTOLIN HFA 108 (90  BASE) MCG/ACT inhaler INHALE 1-2 PUFFS FOUR TIMES DAILY AS NEEDED  . Vitamin D, Ergocalciferol, (DRISDOL) 50000 UNITS CAPS Take 50,000 Units by mouth. Twice weekly  . [DISCONTINUED] montelukast (SINGULAIR) 10 MG tablet Take 10 mg by mouth daily.  . beclomethasone (QVAR) 80 MCG/ACT inhaler Inhale 2 puffs into the lungs 2 (two) times daily.  . [DISCONTINUED] amoxicillin-clavulanate (AUGMENTIN) 875-125 MG per tablet Take 1 tablet by mouth 2 (two) times daily.  . [DISCONTINUED] predniSONE (DELTASONE) 20 MG tablet Take 1 tablet (20 mg total) by mouth 3 (three) times daily.

## 2013-07-26 NOTE — Assessment & Plan Note (Signed)
Severe persistent asthma with poor control, Ig E >350 Plan Resume xolair Add qvar  Add [employer]

## 2013-07-27 LAB — ALLERGY FULL PROFILE
ALTERNARIA ALTERNATA: 1.58 kU/L — AB
Allergen, D pternoyssinus,d7: <0.1 kU/L
Allergen,Goose feathers, e70: <0.1 kU/L
Aspergillus fumigatus, m3: 15.3 kU/L — ABNORMAL HIGH
Bahia Grass: <0.1 kU/L
Box Elder IgE: <0.1 kU/L
Cat Dander: 0.77 kU/L — ABNORMAL HIGH
Common Ragweed: <0.1 kU/L
D. farinae: 0.13 kU/L — ABNORMAL HIGH
Dog Dander: 4.69 kU/L — ABNORMAL HIGH
G005 Rye, Perennial: <0.1 kU/L
Goldenrod: <0.1 kU/L
Helminthosporium halodes: <0.1 kU/L
House Dust Hollister: 3.65 kU/L — ABNORMAL HIGH
IGE (IMMUNOGLOBULIN E), SERUM: 399.1 [IU]/mL — AB (ref 0.0–180.0)
Oak: <0.1 kU/L
Plantain: <0.1 kU/L
Stemphylium Botryosum: <0.1 kU/L
Sycamore Tree: <0.1 kU/L

## 2013-07-31 ENCOUNTER — Telehealth: Payer: Self-pay | Admitting: Critical Care Medicine

## 2013-07-31 DIAGNOSIS — J45909 Unspecified asthma, uncomplicated: Secondary | ICD-10-CM

## 2013-07-31 NOTE — Telephone Encounter (Signed)
Pt needs Xolair 300mg  SQ/IM every two weeks pls order from [facility]. Pt will pick up and deliver to office for injections

## 2013-08-01 NOTE — Progress Notes (Signed)
Quick Note:  Please see phone msg from 07/31/13 for further information. ______

## 2013-08-03 NOTE — Telephone Encounter (Signed)
To start xolair process, pt will need to come by office to sign Xolair start forms.   I have lmomtcb on pt's home and cell #s to let her know and see when would be a good time for her to come by.

## 2013-08-03 NOTE — Telephone Encounter (Signed)
[phone] calling back

## 2013-08-03 NOTE — Telephone Encounter (Signed)
lmtcb

## 2013-08-08 NOTE — Telephone Encounter (Signed)
lmomtcb for pt on home and cell #s 

## 2013-08-08 NOTE — Telephone Encounter (Signed)
Pt returned call & asks to be reached at [phone].  Satira Anis

## 2013-08-08 NOTE — Telephone Encounter (Signed)
Called and spoke to pt regarding the Xolair injections every 2 weeks. Informed pt that the Xolair forms will need to be filled out in order for [place] to start the process. Pt verbalized understanding and stated she will be by around 1-2 pm today to fill out the forms. Will place forms at front for when pt arrives.   Will forward to Crystal to make aware.

## 2013-08-09 NOTE — Telephone Encounter (Signed)
Called and explained to patient about the process for restarting Xolair and that we will contact her once we have approval to move forward with Xolair. Pt understands. I have placed order in EPIC as well as information SMN and PAN filled out and given to Chantel.    Chantel has completed her part of forms and given to Crystal to have PW sign and return to her to further process.

## 2013-08-24 ENCOUNTER — Other Ambulatory Visit: Payer: Self-pay | Admitting: Critical Care Medicine

## 2013-08-28 ENCOUNTER — Encounter: Payer: Self-pay | Admitting: Critical Care Medicine

## 2013-08-28 ENCOUNTER — Ambulatory Visit (INDEPENDENT_AMBULATORY_CARE_PROVIDER_SITE_OTHER): Payer: 59 | Admitting: Critical Care Medicine

## 2013-08-28 ENCOUNTER — Encounter: Payer: 59 | Admitting: Internal Medicine

## 2013-08-28 VITALS — BP 134/72 | HR 65 | Temp 97.9°F | Ht 64.0 in | Wt 180.0 lb

## 2013-08-28 DIAGNOSIS — J4550 Severe persistent asthma, uncomplicated: Secondary | ICD-10-CM

## 2013-08-28 DIAGNOSIS — J45909 Unspecified asthma, uncomplicated: Secondary | ICD-10-CM

## 2013-08-28 DIAGNOSIS — J209 Acute bronchitis, unspecified: Secondary | ICD-10-CM

## 2013-08-28 MED ORDER — PREDNISONE 10 MG PO TABS
ORAL_TABLET | ORAL | Status: DC
Start: 2013-08-28 — End: 2013-09-29

## 2013-08-28 MED ORDER — AMOXICILLIN-POT CLAVULANATE 875-125 MG PO TABS: 1.0000 | ORAL_TABLET | Freq: Two times a day (BID) | ORAL | Status: DC

## 2013-08-28 NOTE — Patient Instructions (Signed)
Prednisone 10mg  Take 4 for two days three for two days two for two days one for two days Augmentin one twice daily for 7days  No other changes

## 2013-08-28 NOTE — Progress Notes (Signed)
Subjective:    Patient ID: Elaine Luna, female    DOB: 11/09/1945, 68 y.o.   MRN: [id number]  HPI   08/28/2013 Chief Complaint  Patient presents with  . Acute Visit    wheezing and prod cough with green mucus.  Symptoms started on Friday night.  Temp up to 101 on Saturday.  Took 4 doses of amoxicillin since Saturday.  Temp to 101.  Cough more prod green mucus. Lost 6#.   No chest pain.  Notes more rescue inhaler and neb med use.    PUL ASTHMA HISTORY 07/26/2013 09/02/2012 07/18/2012 04/27/2011  Symptoms >2 days/week >2 days/week Daily 0-2 days/week  Nighttime awakenings 0-2/month 0-2/month 0-2/month 0-2/month  Interference with activity Minor limitations Some limitations Minor limitations No limitations  SABA use Daily 0-2 days/wk Several times/day 0-2 days/wk  Exacerbations requiring oral steroids 2 or more / year 2 or more / year 0-1 / year 0-1 / year      Review of Systems  Constitutional:   No  weight loss, night sweats,  Fevers, chills, fatigue, lassitude. HEENT:   No headaches,  Difficulty swallowing,  Tooth/dental problems,  Sore throat,                No sneezing, itching, ear ache, nasal congestion, post nasal drip,   CV:  No chest pain,  Orthopnea, PND, swelling in lower extremities, anasarca, dizziness, palpitations  GI  No heartburn, indigestion, abdominal pain, nausea, vomiting, diarrhea, change in bowel habits, loss of appetite  Resp: No shortness of breath with exertion or at rest.  No excess mucus, no productive cough,  No non-productive cough,  No coughing up of blood.  No change in color of mucus.  No wheezing.  No chest wall deformity  Skin: no rash or lesions.  GU: no dysuria, change in color of urine, no urgency or frequency.  No flank pain.  MS:  No joint pain or swelling.  No decreased range of motion.  No back pain.  Psych:  No change in mood or affect. No depression or anxiety.  No memory loss.     Objective:   Physical Exam  Filed Vitals:   08/28/13 0956  BP: 134/72  Pulse: 65  Temp: 97.9 F (36.6 C)  TempSrc: Oral  Height: 5\' 4"  (1.626 m)  Weight: 180 lb (81.647 kg)  SpO2: 98%    Gen: Pleasant, well-nourished, in no distress,  normal affect  ENT: No lesions,  mouth clear,  oropharynx clear, no postnasal drip  Neck: No JVD, no TMG, no carotid bruits  Lungs: No use of accessory muscles, no dullness to percussion,exp wheezes   Cardiovascular: RRR, heart sounds normal, no murmur or gallops, no peripheral edema  Abdomen: soft and NT, no HSM,  BS normal  Musculoskeletal: No deformities, no cyanosis or clubbing  Neuro: alert, non focal  Skin: Warm, no lesions or rashes  No results found.        Assessment & Plan:   Severe persistent asthma, well-controlled Severe persistent asthma with associated acute tracheobronchitis now Plan Prednisone 10mg  Take 4 for two days three for two days two for two days one for two days Augmentin one twice daily for 7days  No other changes      Updated Medication List Outpatient Encounter Prescriptions as of 08/28/2013  Medication Sig  . acetaminophen (TYLENOL) 325 MG tablet Take 650 mg by mouth every 6 (six) hours as needed for fever (alternating motrin).  Marland Kitchen albuterol (PROVENTIL) (2.5 MG/3ML) 0.083%  nebulizer solution Take 3 mLs (2.5 mg total) by nebulization every 6 (six) hours as needed for wheezing.  Marland Kitchen atorvastatin (LIPITOR) 20 MG tablet Take 20 mg by mouth daily.  . beclomethasone (QVAR) 80 MCG/ACT inhaler Inhale 2 puffs into the lungs 2 (two) times daily.  . Denosumab (PROLIA [place]) Inject into the skin every 6 (six) months.  . hydrochlorothiazide (MICROZIDE) 12.5 MG capsule Take 12.5 mg by mouth daily.  Marland Kitchen ibuprofen (ADVIL,MOTRIN) 200 MG tablet Take 400 mg by mouth every 8 (eight) hours as needed for fever (alternating with tylenol).  . irbesartan (AVAPRO) 300 MG tablet Take 300 mg by mouth daily.   . mometasone-formoterol (DULERA) 200-5 MCG/ACT AERO Inhale 2 puffs  into the lungs 2 (two) times daily.  . montelukast (SINGULAIR) 10 MG tablet TAKE 1 TABLET BY MOUTH DAILY  . VENTOLIN HFA 108 (90 BASE) MCG/ACT inhaler INHALE 1-2 PUFFS FOUR TIMES DAILY AS NEEDED  . Vitamin D, Ergocalciferol, (DRISDOL) 50000 UNITS CAPS Take 50,000 Units by mouth. Twice weekly  . amoxicillin-clavulanate (AUGMENTIN) 875-125 MG per tablet Take 1 tablet by mouth 2 (two) times daily.  . predniSONE (DELTASONE) 10 MG tablet Take 4 for two days three for two days two for two days one for two days

## 2013-08-28 NOTE — Assessment & Plan Note (Signed)
Severe persistent asthma with associated acute tracheobronchitis now Plan Prednisone 10mg  Take 4 for two days three for two days two for two days one for two days Augmentin one twice daily for 7days  No other changes

## 2013-09-01 ENCOUNTER — Encounter: Payer: Self-pay | Admitting: Internal Medicine

## 2013-09-15 ENCOUNTER — Telehealth: Payer: Self-pay | Admitting: Critical Care Medicine

## 2013-09-15 MED ORDER — OMALIZUMAB 150 MG ~~LOC~~ SOLR: 300.0000 mg | SUBCUTANEOUS | Status: DC

## 2013-09-15 NOTE — Telephone Encounter (Signed)
Per referral 08/09/13: Type Date User   Provider Comments 08/09/2013 3:08 PM Clayborne Dana C        Note    Refer to Immunology for new Xolair start.    Lab results. 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 399.1 on 07-26-13     Test date: 07-26-13    Patient weight in kg: 84.369    Weight date: 07-26-13    Drug Allergies: NKDA    SIG: 300 mg/dose every 2 weeks  -----   Called spoke with pt. She reports she always gets her xoalir through [facility]. RX has been sent. Nothing further needed

## 2013-09-29 ENCOUNTER — Encounter: Payer: Self-pay | Admitting: Critical Care Medicine

## 2013-09-29 ENCOUNTER — Ambulatory Visit (INDEPENDENT_AMBULATORY_CARE_PROVIDER_SITE_OTHER): Payer: 59 | Admitting: Critical Care Medicine

## 2013-09-29 VITALS — BP 130/70 | HR 62 | Temp 98.1°F | Ht 64.0 in | Wt 183.2 lb

## 2013-09-29 DIAGNOSIS — J4550 Severe persistent asthma, uncomplicated: Secondary | ICD-10-CM

## 2013-09-29 DIAGNOSIS — J45909 Unspecified asthma, uncomplicated: Secondary | ICD-10-CM

## 2013-09-29 MED ORDER — EPINEPHRINE 0.3 MG/0.3ML IJ SOAJ: 0.3000 mg | Freq: Once | INTRAMUSCULAR | Status: DC

## 2013-09-29 NOTE — Patient Instructions (Signed)
[facility] Get started on xolair Referral to Dr Annamaria Boots for allergy evaluation was made Return 3 months

## 2013-09-29 NOTE — Progress Notes (Signed)
Subjective:    Patient ID: Elaine Luna, female    DOB: [birth year], 68 y.o.   MRN: [id number]  HPI  09/29/2013 Chief Complaint  Patient presents with  . Asthma    follow-up. Pt denies any increase in cough, sob at this time. Pt needs an Epi Pen to have when she gets xolair injection.   House tested, no mold.  Air samples in office.  No results as of yet.   No cough no new issues. PUL ASTHMA HISTORY 09/29/2013 07/26/2013 09/02/2012 07/18/2012 04/27/2011  Symptoms >2 days/week >2 days/week >2 days/week Daily 0-2 days/week  Nighttime awakenings 0-2/month 0-2/month 0-2/month 0-2/month 0-2/month  Interference with activity No limitations Minor limitations Some limitations Minor limitations No limitations  SABA use 0-2 days/wk Daily 0-2 days/wk Several times/day 0-2 days/wk  Exacerbations requiring oral steroids 0-1 / year 2 or more / year 2 or more / year 0-1 / year 0-1 / year      Review of Systems Constitutional:   No  weight loss, night sweats,  Fevers, chills, fatigue, lassitude. HEENT:   No headaches,  Difficulty swallowing,  Tooth/dental problems,  Sore throat,                No sneezing, itching, ear ache, nasal congestion, post nasal drip,   CV:  No chest pain,  Orthopnea, PND, swelling in lower extremities, anasarca, dizziness, palpitations  GI  No heartburn, indigestion, abdominal pain, nausea, vomiting, diarrhea, change in bowel habits, loss of appetite  Resp: No shortness of breath with exertion or at rest.  No excess mucus, no productive cough,  No non-productive cough,  No coughing up of blood.  No change in color of mucus.  No wheezing.  No chest wall deformity  Skin: no rash or lesions.  GU: no dysuria, change in color of urine, no urgency or frequency.  No flank pain.  MS:  No joint pain or swelling.  No decreased range of motion.  No back pain.  Psych:  No change in mood or affect. No depression or anxiety.  No memory loss.     Objective:   Physical  Exam  Filed Vitals:   09/29/13 1206  BP: 130/70  Pulse: 62  Temp: 98.1 F (36.7 C)  TempSrc: Oral  Height: 5\' 4"  (1.626 m)  Weight: 183 lb 3.2 oz (83.099 kg)  SpO2: 97%    Gen: Pleasant, well-nourished, in no distress,  normal affect  ENT: No lesions,  mouth clear,  oropharynx clear, no postnasal drip  Neck: No JVD, no TMG, no carotid bruits  Lungs: No use of accessory muscles, no dullness to percussion, no wheezes   Cardiovascular: RRR, heart sounds normal, no murmur or gallops, no peripheral edema  Abdomen: soft and NT, no HSM,  BS normal  Musculoskeletal: No deformities, no cyanosis or clubbing  Neuro: alert, non focal  Skin: Warm, no lesions or rashes  No results found.        Assessment & Plan:   Severe persistent asthma, well-controlled Severe persistent asthma with significant atopic features Plan Begin Xolair therapy Continued inhaled medications as prescribed Referral to allergy will be made    Updated Medication List Outpatient Encounter Prescriptions as of 09/29/2013  Medication Sig  . acetaminophen (TYLENOL) 325 MG tablet Take 650 mg by mouth every 6 (six) hours as needed for fever (alternating motrin).  Marland Kitchen albuterol (PROVENTIL) (2.5 MG/3ML) 0.083% nebulizer solution Take 3 mLs (2.5 mg total) by nebulization every 6 (six) hours as needed  for wheezing.  Marland Kitchen atorvastatin (LIPITOR) 20 MG tablet Take 20 mg by mouth daily.  . beclomethasone (QVAR) 80 MCG/ACT inhaler Inhale 2 puffs into the lungs 2 (two) times daily.  . Denosumab (PROLIA [place]) Inject into the skin every 6 (six) months.  . hydrochlorothiazide (MICROZIDE) 12.5 MG capsule Take 12.5 mg by mouth daily.  Marland Kitchen ibuprofen (ADVIL,MOTRIN) 200 MG tablet Take 400 mg by mouth every 8 (eight) hours as needed for fever (alternating with tylenol).  . irbesartan (AVAPRO) 300 MG tablet Take 300 mg by mouth daily.   . mometasone-formoterol (DULERA) 200-5 MCG/ACT AERO Inhale 2 puffs into the lungs 2 (two) times  daily.  . montelukast (SINGULAIR) 10 MG tablet TAKE 1 TABLET BY MOUTH DAILY  . VENTOLIN HFA 108 (90 BASE) MCG/ACT inhaler INHALE 1-2 PUFFS FOUR TIMES DAILY AS NEEDED  . Vitamin D, Ergocalciferol, (DRISDOL) 50000 UNITS CAPS Take 50,000 Units by mouth. Twice weekly  . EPINEPHrine 0.3 mg/0.3 mL IJ SOAJ injection Inject 0.3 mLs (0.3 mg total) into the muscle once.  Marland Kitchen omalizumab (XOLAIR) 150 MG injection Inject 300 mg into the skin every 14 (fourteen) days.  . [DISCONTINUED] amoxicillin-clavulanate (AUGMENTIN) 875-125 MG per tablet Take 1 tablet by mouth 2 (two) times daily.  . [DISCONTINUED] predniSONE (DELTASONE) 10 MG tablet Take 4 for two days three for two days two for two days one for two days

## 2013-09-29 NOTE — Assessment & Plan Note (Signed)
Severe persistent asthma with significant atopic features Plan Begin Xolair therapy Continued inhaled medications as prescribed Referral to allergy will be made

## 2013-10-12 ENCOUNTER — Encounter: Payer: Self-pay | Admitting: Internal Medicine

## 2013-11-14 ENCOUNTER — Other Ambulatory Visit: Payer: Self-pay | Admitting: Critical Care Medicine

## 2013-11-16 ENCOUNTER — Institutional Professional Consult (permissible substitution): Payer: 59 | Admitting: Internal Medicine

## 2013-11-17 ENCOUNTER — Other Ambulatory Visit: Payer: Self-pay | Admitting: *Deleted

## 2013-11-17 ENCOUNTER — Ambulatory Visit (INDEPENDENT_AMBULATORY_CARE_PROVIDER_SITE_OTHER): Payer: 59

## 2013-11-17 DIAGNOSIS — J4550 Severe persistent asthma, uncomplicated: Secondary | ICD-10-CM

## 2013-11-17 MED ORDER — ALBUTEROL SULFATE (2.5 MG/3ML) 0.083% IN NEBU: 2.5000 mg | INHALATION_SOLUTION | Freq: Four times a day (QID) | RESPIRATORY_TRACT | Status: DC | PRN

## 2013-11-22 MED ORDER — OMALIZUMAB 150 MG ~~LOC~~ SOLR
150.0000 mg | Freq: Once | SUBCUTANEOUS | Status: AC
  Administered 2013-11-22: 150 mg via SUBCUTANEOUS

## 2013-12-01 ENCOUNTER — Ambulatory Visit (INDEPENDENT_AMBULATORY_CARE_PROVIDER_SITE_OTHER): Payer: 59

## 2013-12-01 DIAGNOSIS — J4550 Severe persistent asthma, uncomplicated: Secondary | ICD-10-CM

## 2013-12-05 ENCOUNTER — Telehealth: Payer: Self-pay | Admitting: Critical Care Medicine

## 2013-12-05 NOTE — Telephone Encounter (Signed)
Spoke w/ Alroy Bailiff, she reports this is fine to do so. I called spoke with Richardson Landry w/ [facility] and made aware. Nothing further needed

## 2013-12-11 MED ORDER — OMALIZUMAB 150 MG ~~LOC~~ SOLR
150.0000 mg | Freq: Once | SUBCUTANEOUS | Status: AC
  Administered 2013-12-11: 150 mg via SUBCUTANEOUS

## 2013-12-15 ENCOUNTER — Ambulatory Visit: Payer: 59

## 2013-12-18 ENCOUNTER — Ambulatory Visit (INDEPENDENT_AMBULATORY_CARE_PROVIDER_SITE_OTHER): Payer: 59

## 2013-12-18 DIAGNOSIS — J4550 Severe persistent asthma, uncomplicated: Secondary | ICD-10-CM

## 2013-12-19 MED ORDER — OMALIZUMAB 150 MG ~~LOC~~ SOLR
300.0000 mg | Freq: Once | SUBCUTANEOUS | Status: AC
  Administered 2013-12-19: 300 mg via SUBCUTANEOUS

## 2014-01-01 ENCOUNTER — Ambulatory Visit: Payer: 59

## 2014-01-01 ENCOUNTER — Encounter: Payer: Self-pay | Admitting: Critical Care Medicine

## 2014-01-01 ENCOUNTER — Ambulatory Visit (INDEPENDENT_AMBULATORY_CARE_PROVIDER_SITE_OTHER): Payer: 59 | Admitting: Critical Care Medicine

## 2014-01-01 VITALS — BP 150/86 | HR 58 | Temp 98.4°F | Ht 64.0 in | Wt 186.0 lb

## 2014-01-01 DIAGNOSIS — J4551 Severe persistent asthma with (acute) exacerbation: Secondary | ICD-10-CM

## 2014-01-01 MED ORDER — PREDNISONE 10 MG PO TABS: ORAL_TABLET | ORAL | Status: DC

## 2014-01-01 MED ORDER — LEVOFLOXACIN 500 MG PO TABS: 500.0000 mg | ORAL_TABLET | Freq: Every day | ORAL | Status: DC

## 2014-01-01 NOTE — Patient Instructions (Signed)
Take levaquin 500mg  daily for 7days Prednisone 10mg  Take 4 for three days 3 for three days 2 for three days 1 for three days and stop No change in inhalers Hold Xolair for one week then resume Return 2 week recheck Elaine Luna

## 2014-01-01 NOTE — Assessment & Plan Note (Addendum)
Severe persistent asthma with exacerbation at this time secondary to acute tracheobronchitis and FLAIR Plan Take levaquin 500mg  daily for 7days Prednisone 10mg  Take 4 for three days 3 for three days 2 for three days 1 for three days and stop No change in inhalers Hold Xolair for one week then resume Return 2 week recheck Tammy Parrett

## 2014-01-01 NOTE — Progress Notes (Signed)
Subjective:    Patient ID: Elaine Luna, female    DOB: 12-30-1945, 68 y.o.   MRN: [id number]  HPI  01/01/2014 Chief Complaint  Patient presents with  . 3 month follow up    Wheezing and cough have improved but still present.  Cough is prod with green to clear mucus.   Pt still coughing and prod green mucus.  No sinus issues.  Pt still wheezing and dyspneic  There is no fever. There is no sinus congestion or drainage. There is chest tightness noted. The patient appears to be declining over the past several months. PUL ASTHMA HISTORY 01/01/2014 09/29/2013 07/26/2013 09/02/2012 07/18/2012 04/27/2011  Symptoms - >2 days/week >2 days/week >2 days/week Daily 0-2 days/week  Nighttime awakenings Often--7/wk 0-2/month 0-2/month 0-2/month 0-2/month 0-2/month  Interference with activity Some limitations No limitations Minor limitations Some limitations Minor limitations No limitations  SABA use Several times/day 0-2 days/wk Daily 0-2 days/wk Several times/day 0-2 days/wk  Exacerbations requiring oral steroids 2 or more / year 0-1 / year 2 or more / year 2 or more / year 0-1 / year 0-1 / year   Started back on Xolair x 2 months since 10/2013   Review of Systems Constitutional:   No  weight loss, night sweats,  Fevers, chills, fatigue, lassitude. HEENT:   No headaches,  Difficulty swallowing,  Tooth/dental problems,  Sore throat,                No sneezing, itching, ear ache, nasal congestion, post nasal drip,   CV:  No chest pain,  Orthopnea, PND, swelling in lower extremities, anasarca, dizziness, palpitations  GI  No heartburn, indigestion, abdominal pain, nausea, vomiting, diarrhea, change in bowel habits, loss of appetite  Resp: No tes shortness of breath with exertion and  at rest.  Notes excess mucus, notes productive cough,  No non-productive cough,  No coughing up of blood.  Notes change in color of mucus.  Notes wheezing.  No chest wall deformity  Skin: no rash or lesions.  GU: no  dysuria, change in color of urine, no urgency or frequency.  No flank pain.  MS:  No joint pain or swelling.  No decreased range of motion.  No back pain.  Psych:  No change in mood or affect. No depression or anxiety.  No memory loss.     Objective:   Physical Exam Filed Vitals:   01/01/14 1229  BP: 150/86  Pulse: 58  Temp: 98.4 F (36.9 C)  TempSrc: Oral  Height: 5\' 4"  (1.626 m)  Weight: 186 lb (84.369 kg)  SpO2: 97%    Gen: Pleasant, well-nourished, in no distress,  normal affect  ENT: No lesions,  mouth clear,  oropharynx clear, no postnasal drip  Neck: No JVD, no TMG, no carotid bruits  Lungs: No use of accessory muscles, no dullness to percussion, inspiratory and expiratory wheezes wheezes   Cardiovascular: RRR, heart sounds normal, no murmur or gallops, no peripheral edema  Abdomen: soft and NT, no HSM,  BS normal  Musculoskeletal: No deformities, no cyanosis or clubbing  Neuro: alert, non focal  Skin: Warm, no lesions or rashes  No results found.        Assessment & Plan:   Not well controlled severe persistent asthma with acute exacerbation Severe persistent asthma with exacerbation at this time secondary to acute tracheobronchitis and FLAIR Plan Take levaquin 500mg  daily for 7days Prednisone 10mg  Take 4 for three days 3 for three days 2 for three  days 1 for three days and stop No change in inhalers Hold Xolair for one week then resume Return 2 week recheck Tammy Parrett     Updated Medication List Outpatient Encounter Prescriptions as of 01/01/2014  Medication Sig  . acetaminophen (TYLENOL) 325 MG tablet Take 650 mg by mouth every 6 (six) hours as needed for fever (alternating motrin).  Marland Kitchen albuterol (PROVENTIL) (2.5 MG/3ML) 0.083% nebulizer solution Take 3 mLs (2.5 mg total) by nebulization every 6 (six) hours as needed for wheezing.  Marland Kitchen atorvastatin (LIPITOR) 20 MG tablet Take 20 mg by mouth daily.  . beclomethasone (QVAR) 80 MCG/ACT inhaler  Inhale 2 puffs into the lungs 2 (two) times daily.  . Denosumab (PROLIA [place]) Inject into the skin every 6 (six) months.  . EPINEPHrine 0.3 mg/0.3 mL IJ SOAJ injection Inject 0.3 mLs (0.3 mg total) into the muscle once.  . hydrochlorothiazide (MICROZIDE) 12.5 MG capsule Take 12.5 mg by mouth daily.  Marland Kitchen ibuprofen (ADVIL,MOTRIN) 200 MG tablet Take 400 mg by mouth every 8 (eight) hours as needed for fever (alternating with tylenol).  . irbesartan (AVAPRO) 300 MG tablet Take 300 mg by mouth daily.   . metFORMIN (GLUCOPHAGE-XR) 500 MG 24 hr tablet Take 1 tablet by mouth daily.  . mometasone-formoterol (DULERA) 200-5 MCG/ACT AERO Inhale 2 puffs into the lungs 2 (two) times daily.  . montelukast (SINGULAIR) 10 MG tablet TAKE 1 TABLET BY MOUTH DAILY  . omalizumab (XOLAIR) 150 MG injection Inject 300 mg into the skin every 14 (fourteen) days.  . VENTOLIN HFA 108 (90 BASE) MCG/ACT inhaler INHALE 1-2 PUFFS FOUR TIMES DAILY AS NEEDED  . Vitamin D, Ergocalciferol, (DRISDOL) 50000 UNITS CAPS Take 50,000 Units by mouth. Twice weekly  . levofloxacin (LEVAQUIN) 500 MG tablet Take 1 tablet (500 mg total) by mouth daily.  . predniSONE (DELTASONE) 10 MG tablet Take 4 for three days 3 for three days 2 for three days 1 for three days and stop

## 2014-01-08 ENCOUNTER — Ambulatory Visit (INDEPENDENT_AMBULATORY_CARE_PROVIDER_SITE_OTHER): Payer: 59

## 2014-01-08 DIAGNOSIS — J4520 Mild intermittent asthma, uncomplicated: Secondary | ICD-10-CM

## 2014-01-09 DIAGNOSIS — J4520 Mild intermittent asthma, uncomplicated: Secondary | ICD-10-CM

## 2014-01-09 MED ORDER — OMALIZUMAB 150 MG ~~LOC~~ SOLR
300.0000 mg | Freq: Once | SUBCUTANEOUS | Status: AC
  Administered 2014-01-08: 300 mg via SUBCUTANEOUS

## 2014-01-15 ENCOUNTER — Ambulatory Visit (INDEPENDENT_AMBULATORY_CARE_PROVIDER_SITE_OTHER): Payer: 59 | Admitting: Adult Health

## 2014-01-15 ENCOUNTER — Encounter: Payer: Self-pay | Admitting: Adult Health

## 2014-01-15 VITALS — BP 134/66 | HR 67 | Temp 98.0°F

## 2014-01-15 DIAGNOSIS — J4551 Severe persistent asthma with (acute) exacerbation: Secondary | ICD-10-CM

## 2014-01-15 NOTE — Progress Notes (Signed)
   Subjective:    Patient ID: Elaine Luna, female    DOB: 08/15/1945, 68 y.o.   MRN: [id number]  HPI 68 yo female with severe persistent asthma   01/15/2014 Follow up Asthma Flare  Was seen 2 weeks ago for flare , tx w/ Levaquin and Steroid taper  Is on maximum therapy with Xolair , Singlair , QVAR and Dulera .  Has been back on Xolair for 2 months Last shot was 1 week ago  Is not back to baseline but is feeling better. Afraid that once off steroids symptoms seem to creep back in .  denies f/c/s, n/v/d, hemoptysis.  Review of Systems Constitutional:   No  weight loss, night sweats,  Fevers, chills, + fatigue, or  lassitude.  HEENT:   No headaches,  Difficulty swallowing,  Tooth/dental problems, or  Sore throat,                No sneezing, itching, ear ache,  +nasal congestion, post nasal drip,   CV:  No chest pain,  Orthopnea, PND, swelling in lower extremities, anasarca, dizziness, palpitations, syncope.   GI  No heartburn, indigestion, abdominal pain, nausea, vomiting, diarrhea, change in bowel habits, loss of appetite, bloody stools.   Resp: No shortness of breath with exertion or at rest.  No excess mucus, no productive cough,  No non-productive cough,  No coughing up of blood.  No change in color of mucus.  No wheezing.  No chest wall deformity  Skin: no rash or lesions.  GU: no dysuria, change in color of urine, no urgency or frequency.  No flank pain, no hematuria   MS:  No joint pain or swelling.  No decreased range of motion.  No back pain.  Psych:  No change in mood or affect. No depression or anxiety.  No memory loss.         Objective:   Physical Exam GEN: A/Ox3; pleasant , NAD, well nourished   HEENT:  [place]/AT,  EACs-clear, TMs-wnl, NOSE-clear drainage , THROAT-clear, no lesions, no postnasal drip or exudate noted.   NECK:  Supple w/ fair ROM; no JVD; normal carotid impulses w/o bruits; no thyromegaly or nodules palpated; no lymphadenopathy.  RESP  Clear   P & A; w/o, wheezes/ rales/ or rhonchi.no accessory muscle use, no dullness to percussion  CARD:  RRR, no m/r/g  , no peripheral edema, pulses intact, no cyanosis or clubbing.  GI:   Soft & nt; nml bowel sounds; no organomegaly or masses detected.  Musco: Warm bil, no deformities or joint swelling noted.   Neuro: alert, no focal deficits noted.    Skin: Warm, no lesions or rashes          Assessment & Plan:

## 2014-01-15 NOTE — Patient Instructions (Addendum)
Continue on current regimen  Can use Zyrtec 10mg  At bedtime  For drainage  follow up Dr. Joya Gaskins  In 3 months and As needed

## 2014-01-18 NOTE — Assessment & Plan Note (Signed)
Recent flare now resolving  Continue on max therapy.   Plan  Continue on current regimen  Can use Zyrtec 10mg  At bedtime  For drainage  follow up Dr. Joya Gaskins  In 3 months and As needed

## 2014-01-22 ENCOUNTER — Ambulatory Visit (INDEPENDENT_AMBULATORY_CARE_PROVIDER_SITE_OTHER): Payer: 59

## 2014-01-22 DIAGNOSIS — J4520 Mild intermittent asthma, uncomplicated: Secondary | ICD-10-CM

## 2014-01-23 MED ORDER — OMALIZUMAB 150 MG ~~LOC~~ SOLR
300.0000 mg | Freq: Once | SUBCUTANEOUS | Status: AC
  Administered 2014-01-22: 300 mg via SUBCUTANEOUS

## 2014-02-05 ENCOUNTER — Ambulatory Visit (INDEPENDENT_AMBULATORY_CARE_PROVIDER_SITE_OTHER): Payer: 59

## 2014-02-05 DIAGNOSIS — J4520 Mild intermittent asthma, uncomplicated: Secondary | ICD-10-CM

## 2014-02-07 MED ORDER — OMALIZUMAB 150 MG ~~LOC~~ SOLR
300.0000 mg | Freq: Once | SUBCUTANEOUS | Status: AC
  Administered 2014-02-05: 300 mg via SUBCUTANEOUS

## 2014-02-22 ENCOUNTER — Ambulatory Visit (INDEPENDENT_AMBULATORY_CARE_PROVIDER_SITE_OTHER): Payer: 59

## 2014-02-22 DIAGNOSIS — J4551 Severe persistent asthma with (acute) exacerbation: Secondary | ICD-10-CM

## 2014-02-23 ENCOUNTER — Ambulatory Visit: Payer: 59

## 2014-03-02 MED ORDER — OMALIZUMAB 150 MG ~~LOC~~ SOLR
300.0000 mg | Freq: Once | SUBCUTANEOUS | Status: AC
  Administered 2014-02-22: 300 mg via SUBCUTANEOUS

## 2014-03-09 ENCOUNTER — Ambulatory Visit (INDEPENDENT_AMBULATORY_CARE_PROVIDER_SITE_OTHER): Payer: 59

## 2014-03-09 DIAGNOSIS — J4551 Severe persistent asthma with (acute) exacerbation: Secondary | ICD-10-CM

## 2014-03-09 MED ORDER — OMALIZUMAB 150 MG ~~LOC~~ SOLR
300.0000 mg | Freq: Once | SUBCUTANEOUS | Status: AC
  Administered 2014-03-09: 300 mg via SUBCUTANEOUS

## 2014-03-23 ENCOUNTER — Ambulatory Visit: Payer: 59

## 2014-03-26 ENCOUNTER — Ambulatory Visit (INDEPENDENT_AMBULATORY_CARE_PROVIDER_SITE_OTHER): Payer: 59

## 2014-03-26 DIAGNOSIS — J4540 Moderate persistent asthma, uncomplicated: Secondary | ICD-10-CM

## 2014-03-27 MED ORDER — OMALIZUMAB 150 MG ~~LOC~~ SOLR
300.0000 mg | Freq: Once | SUBCUTANEOUS | Status: AC
  Administered 2014-03-26: 300 mg via SUBCUTANEOUS

## 2014-04-09 ENCOUNTER — Ambulatory Visit (INDEPENDENT_AMBULATORY_CARE_PROVIDER_SITE_OTHER)
Admission: RE | Admit: 2014-04-09 | Discharge: 2014-04-09 | Disposition: A | Discharge status: Home / Self Care | Payer: 59 | Source: Ambulatory Visit | Attending: Pulmonary Disease | Admitting: Pulmonary Disease

## 2014-04-09 ENCOUNTER — Encounter: Payer: Self-pay | Admitting: Pulmonary Disease

## 2014-04-09 ENCOUNTER — Ambulatory Visit: Payer: 59

## 2014-04-09 ENCOUNTER — Telehealth: Payer: Self-pay | Admitting: Pulmonary Disease

## 2014-04-09 ENCOUNTER — Ambulatory Visit (INDEPENDENT_AMBULATORY_CARE_PROVIDER_SITE_OTHER): Payer: 59 | Admitting: Pulmonary Disease

## 2014-04-09 VITALS — BP 160/90 | HR 63 | Temp 97.0°F | Ht 64.0 in | Wt 180.6 lb

## 2014-04-09 DIAGNOSIS — R059 Cough, unspecified: Secondary | ICD-10-CM

## 2014-04-09 DIAGNOSIS — R05 Cough: Secondary | ICD-10-CM

## 2014-04-09 DIAGNOSIS — J4551 Severe persistent asthma with (acute) exacerbation: Secondary | ICD-10-CM

## 2014-04-09 DIAGNOSIS — J4550 Severe persistent asthma, uncomplicated: Secondary | ICD-10-CM

## 2014-04-09 MED ORDER — HYDROCOD POLST-CHLORPHEN POLST 10-8 MG/5ML PO LQCR: 5.0000 mL | Freq: Every evening | ORAL | Status: DC | PRN

## 2014-04-09 MED ORDER — PREDNISONE 10 MG PO TABS: ORAL_TABLET | ORAL | Status: DC

## 2014-04-09 MED ORDER — LEVOFLOXACIN 500 MG PO TABS: 500.0000 mg | ORAL_TABLET | Freq: Every day | ORAL | Status: AC

## 2014-04-09 MED ORDER — BECLOMETHASONE DIPROPIONATE 80 MCG/ACT IN AERS: 2.0000 | INHALATION_SPRAY | Freq: Two times a day (BID) | RESPIRATORY_TRACT | Status: DC

## 2014-04-09 MED ORDER — LEVALBUTEROL HCL 0.63 MG/3ML IN NEBU
0.6300 mg | INHALATION_SOLUTION | Freq: Once | RESPIRATORY_TRACT | Status: AC
  Administered 2014-04-09: 0.63 mg via RESPIRATORY_TRACT

## 2014-04-09 MED ORDER — ALBUTEROL SULFATE (2.5 MG/3ML) 0.083% IN NEBU: 2.5000 mg | INHALATION_SOLUTION | Freq: Four times a day (QID) | RESPIRATORY_TRACT | Status: DC | PRN

## 2014-04-09 NOTE — Assessment & Plan Note (Signed)
She is experiencing another flare of asthma today. She has significant wheezing on exam. This is likely related to a viral upper respiratory infection but with the green and purulent sputum production and her exposure history at work and multiple frequent exacerbations in the past and then a cover her with antibiotics in case this is a viral bronchitis. At this point she should be able to be successfully treated with prednisone and antibiotics as an outpatient but I have advised that she go to the hospital if she starts to get worse.  Plan:  -CXR today to rule out pneumonia -prednisone taper  -Levaquin for 5 days -Use albuterol nebulizer 4 times a day -Continue using Dulera and Qvar -Come to hospital if no improvement or worse

## 2014-04-09 NOTE — Patient Instructions (Signed)
Take the prednisone taper as written Take the levaquin for 5 days then stop, take it with a probiotic Take the tussionex at night for the cough Take your rescue nebulizer 4 times a day at home Continue taking the [facility] and the QVar as you are doing Go to the hospital if you get worse

## 2014-04-09 NOTE — Progress Notes (Signed)
Subjective:    Patient ID: Elaine Luna, female    DOB: 07-12-1945, 69 y.o.   MRN: [id number]  HPI  Chief Complaint  Patient presents with  . Acute Visit    wheezing, unable to sleep due to cough, coughing up green mucus, chest tightness, fatigue, needs refill on Dulera, Neb solution   Elaine Luna started getting wheezig on Saturday after she was near her granddaughter who was sick with a cough.  She has been having a cough and wheeizng and dyspnea since then.  She feels weak.  She coughed up green to yellow mucus with a scant amount of blood in it.  She denies fever or chills. She started taking prednisone yesterday and took 20mg .    Past Medical History  Diagnosis Date  . Asthma   . Hyperlipidemia   . Hypertension   . Tubular adenoma   . Colon polyps   . Obesity   . Prinzmetal angina   . DM type 2 (diabetes mellitus, type 2) 2015      Review of Systems  Constitutional: Negative for fever, chills and fatigue.  HENT: Negative for postnasal drip, rhinorrhea and sinus pressure.   Respiratory: Positive for cough, shortness of breath and wheezing.   Cardiovascular: Negative for chest pain, palpitations and leg swelling.       Objective:   Physical Exam Filed Vitals:   04/09/14 1354  BP: 160/90  Pulse: 63  Temp: 97 F (36.1 C)  TempSrc: Oral  Height: 5\' 4"  (1.626 m)  Weight: 180 lb 9.6 oz (81.92 kg)  SpO2: 97%  RA  Gen: speaking in full sentences but mildly tachypneic HEENT: NCAT,  EOMi, OP clear PULM: upper airway and lower airway wheezing, good air movement CV: RRR, no mgr, no JVD AB: BS+, soft, nontender, Ext: warm, no clubbing, no cyanosis Derm: no rash or skin breakdown Neuro: A&Ox4, MAEW      Assessment & Plan:   Not well controlled severe persistent asthma with acute exacerbation She is experiencing another flare of asthma today. She has significant wheezing on exam. This is likely related to a viral upper respiratory infection but with the green  and purulent sputum production and her exposure history at work and multiple frequent exacerbations in the past and then a cover her with antibiotics in case this is a viral bronchitis. At this point she should be able to be successfully treated with prednisone and antibiotics as an outpatient but I have advised that she go to the hospital if she starts to get worse.  Plan:  -CXR today to rule out pneumonia -prednisone taper  -Levaquin for 5 days -Use albuterol nebulizer 4 times a day -Continue using Dulera and Qvar -Come to hospital if no improvement or worse     Updated Medication List Outpatient Encounter Prescriptions as of 04/09/2014  Medication Sig  . acetaminophen (TYLENOL) 325 MG tablet Take 650 mg by mouth every 6 (six) hours as needed for fever (alternating motrin).  Marland Kitchen atorvastatin (LIPITOR) 20 MG tablet Take 20 mg by mouth daily.  . Denosumab (PROLIA [place]) Inject into the skin every 6 (six) months.  . EPINEPHrine 0.3 mg/0.3 mL IJ SOAJ injection Inject 0.3 mLs (0.3 mg total) into the muscle once.  . hydrochlorothiazide (MICROZIDE) 12.5 MG capsule Take 12.5 mg by mouth daily.  Marland Kitchen ibuprofen (ADVIL,MOTRIN) 200 MG tablet Take 400 mg by mouth every 8 (eight) hours as needed for fever (alternating with tylenol).  . irbesartan (AVAPRO) 300 MG tablet Take  300 mg by mouth daily.   . metFORMIN (GLUCOPHAGE-XR) 500 MG 24 hr tablet Take 1 tablet by mouth daily.  . mometasone-formoterol (DULERA) 200-5 MCG/ACT AERO Inhale 2 puffs into the lungs 2 (two) times daily.  . montelukast (SINGULAIR) 10 MG tablet TAKE 1 TABLET BY MOUTH DAILY  . omalizumab (XOLAIR) 150 MG injection Inject 300 mg into the skin every 14 (fourteen) days.  . VENTOLIN HFA 108 (90 BASE) MCG/ACT inhaler INHALE 1-2 PUFFS FOUR TIMES DAILY AS NEEDED  . Vitamin D, Ergocalciferol, (DRISDOL) 50000 UNITS CAPS Take 50,000 Units by mouth. Twice weekly  . [DISCONTINUED] albuterol (PROVENTIL) (2.5 MG/3ML) 0.083% nebulizer solution Take 3 mLs  (2.5 mg total) by nebulization every 6 (six) hours as needed for wheezing.  . [DISCONTINUED] beclomethasone (QVAR) 80 MCG/ACT inhaler Inhale 2 puffs into the lungs 2 (two) times daily.  . chlorpheniramine-HYDROcodone (TUSSIONEX PENNKINETIC ER) 10-8 MG/5ML LQCR Take 5 mLs by mouth at bedtime as needed for cough.  Marland Kitchen levofloxacin (LEVAQUIN) 500 MG tablet Take 1 tablet (500 mg total) by mouth daily.  . predniSONE (DELTASONE) 10 MG tablet 60mg  daily for 2 days, then 40mg  po daily for 3 days, then take 30mg  po daily for 3 days, then take 20mg  po daily for two days, then take 10mg  po daily for 2 days  . [EXPIRED] levalbuterol (XOPENEX) nebulizer solution 0.63 mg

## 2014-04-09 NOTE — Telephone Encounter (Signed)
Patient Instructions     Take the prednisone taper as written Take the levaquin for 5 days then stop, take it with a probiotic Take the tussionex at night for the cough Take your rescue nebulizer 4 times a day at home Continue taking the [facility] and the QVar as you are doing Go to the hospital if you get worse    ----------  Spoke with pt.  Requesting Qvar 80 and albuterol neb rxs sent to [facility].  Rxs sent. Pt aware and voiced no further questions or concerns at this time.

## 2014-04-11 NOTE — Progress Notes (Signed)
Quick Note:  Pt aware of results. Nothing further needed. ______ 

## 2014-04-13 ENCOUNTER — Ambulatory Visit (INDEPENDENT_AMBULATORY_CARE_PROVIDER_SITE_OTHER): Payer: 59

## 2014-04-13 DIAGNOSIS — J4540 Moderate persistent asthma, uncomplicated: Secondary | ICD-10-CM

## 2014-04-16 DIAGNOSIS — J4540 Moderate persistent asthma, uncomplicated: Secondary | ICD-10-CM

## 2014-04-16 MED ORDER — OMALIZUMAB 150 MG ~~LOC~~ SOLR
300.0000 mg | Freq: Once | SUBCUTANEOUS | Status: AC
  Administered 2014-04-13: 300 mg via SUBCUTANEOUS

## 2014-04-30 ENCOUNTER — Encounter (INDEPENDENT_AMBULATORY_CARE_PROVIDER_SITE_OTHER): Payer: Self-pay

## 2014-04-30 ENCOUNTER — Ambulatory Visit (INDEPENDENT_AMBULATORY_CARE_PROVIDER_SITE_OTHER): Payer: 59

## 2014-04-30 DIAGNOSIS — J4540 Moderate persistent asthma, uncomplicated: Secondary | ICD-10-CM

## 2014-04-30 MED ORDER — OMALIZUMAB 150 MG ~~LOC~~ SOLR
300.0000 mg | Freq: Once | SUBCUTANEOUS | Status: AC
  Administered 2014-04-30: 300 mg via SUBCUTANEOUS

## 2014-05-14 ENCOUNTER — Ambulatory Visit (INDEPENDENT_AMBULATORY_CARE_PROVIDER_SITE_OTHER): Payer: 59

## 2014-05-14 DIAGNOSIS — J4540 Moderate persistent asthma, uncomplicated: Secondary | ICD-10-CM | POA: Diagnosis not present

## 2014-05-15 MED ORDER — OMALIZUMAB 150 MG ~~LOC~~ SOLR
300.0000 mg | Freq: Once | SUBCUTANEOUS | Status: AC
  Administered 2014-05-14: 300 mg via SUBCUTANEOUS

## 2014-05-16 ENCOUNTER — Telehealth: Payer: Self-pay | Admitting: Critical Care Medicine

## 2014-05-16 NOTE — Telephone Encounter (Signed)
Pt calling to check on status of request, I let her know that we were waiting to hear back from dr Joya Gaskins.Hillery Hunter

## 2014-05-16 NOTE — Telephone Encounter (Signed)
Spoke with pt and scheduled ov with [place] for 10:15 am tomorrow

## 2014-05-16 NOTE — Telephone Encounter (Signed)
This pt should have been seen either today or should have had meds called in   i spoke to pt  And she will take pred 40mg  x 1 dose today and keep appt in am

## 2014-05-16 NOTE — Telephone Encounter (Signed)
Spoke with pt, since yesterday has been having a prod cough with dark green mucus, swollen glands in neck, wheezing, sob.  Denies fever, chest pain.   Pt has taken zyrtec, inhalers, and nebulizers.  Pt uses [facility] outpatient pharmacy.    Dr. Joya Gaskins please advise on recs.  Thanks!

## 2014-05-17 ENCOUNTER — Ambulatory Visit (INDEPENDENT_AMBULATORY_CARE_PROVIDER_SITE_OTHER): Payer: 59 | Admitting: Pulmonary Disease

## 2014-05-17 ENCOUNTER — Encounter: Payer: Self-pay | Admitting: Pulmonary Disease

## 2014-05-17 ENCOUNTER — Encounter (INDEPENDENT_AMBULATORY_CARE_PROVIDER_SITE_OTHER): Payer: Self-pay

## 2014-05-17 VITALS — BP 132/70 | HR 60 | Temp 97.5°F | Ht 64.0 in | Wt 182.6 lb

## 2014-05-17 DIAGNOSIS — J4551 Severe persistent asthma with (acute) exacerbation: Secondary | ICD-10-CM | POA: Diagnosis not present

## 2014-05-17 DIAGNOSIS — J209 Acute bronchitis, unspecified: Secondary | ICD-10-CM

## 2014-05-17 MED ORDER — LEVOFLOXACIN 750 MG PO TABS: 750.0000 mg | ORAL_TABLET | Freq: Every day | ORAL | Status: DC

## 2014-05-17 MED ORDER — PREDNISONE 10 MG PO TABS: ORAL_TABLET | ORAL | Status: DC

## 2014-05-17 NOTE — Assessment & Plan Note (Signed)
The patient is having another episode of acute exacerbation associated with acute bronchitis. I will treat her with a course of prednisone to get her through this episode, and I've asked her to continue on her maintenance medications. She has a follow-up visit with her primary pulmonologist, and can discuss further treatment options.

## 2014-05-17 NOTE — Patient Instructions (Signed)
Will treat with an 8 day course of prednisone levaquin 750mg  one a day for 5 days Keep followup apptm with Dr. Joya Gaskins

## 2014-05-17 NOTE — Assessment & Plan Note (Signed)
The patient is having worsening chest congestion or cough productive of purulent mucus. She typically has issues with bacterial infections rather than viral. Given the severity of her asthma and her brittleness, will treat with a course of antibiotics

## 2014-05-17 NOTE — Progress Notes (Signed)
   Subjective:    Patient ID: Elaine Luna, female    DOB: [birth year], 69 y.o.   MRN: [id number]  HPI The patient comes in today for an acute sick visit. She has severe asthma with recurrent exacerbations, and gives a few week history of increasing chest congestion, cough with purulence, and increased shortness of breath. He is also been having a lot of wheezing, but based on her description, it sounds more upper airway in origin.   Review of Systems  Constitutional: Negative for fever and unexpected weight change.  HENT: Positive for congestion and postnasal drip. Negative for dental problem, ear pain, nosebleeds, rhinorrhea, sinus pressure, sneezing, sore throat and trouble swallowing.   Eyes: Negative for redness and itching.  Respiratory: Positive for cough, shortness of breath and wheezing. Negative for chest tightness.   Cardiovascular: Negative for palpitations and leg swelling.  Gastrointestinal: Negative for nausea and vomiting.  Genitourinary: Negative for dysuria.  Musculoskeletal: Negative for joint swelling.  Skin: Negative for rash.  Neurological: Negative for headaches.  Hematological: Does not bruise/bleed easily.  Psychiatric/Behavioral: Negative for dysphoric mood. The patient is not nervous/anxious.        Objective:   Physical Exam Well-developed female in no acute distress Nose without purulence or discharge noted Neck without lymphadenopathy or thyromegaly Chest with diffuse rhonchi, a few true wheezes, but no crackles. Prominent upper airway pseudo wheezing. Cardiac exam with regular rate and rhythm Lower extremities without significant edema, no cyanosis Alert and oriented, moves all 4 extremities.       Assessment & Plan:

## 2014-05-28 ENCOUNTER — Ambulatory Visit (INDEPENDENT_AMBULATORY_CARE_PROVIDER_SITE_OTHER): Payer: 59

## 2014-05-28 DIAGNOSIS — J4540 Moderate persistent asthma, uncomplicated: Secondary | ICD-10-CM

## 2014-05-29 MED ORDER — OMALIZUMAB 150 MG ~~LOC~~ SOLR
300.0000 mg | Freq: Once | SUBCUTANEOUS | Status: AC
  Administered 2014-05-28: 300 mg via SUBCUTANEOUS

## 2014-06-11 ENCOUNTER — Ambulatory Visit: Payer: 59

## 2014-06-18 ENCOUNTER — Ambulatory Visit (INDEPENDENT_AMBULATORY_CARE_PROVIDER_SITE_OTHER): Payer: 59

## 2014-06-18 ENCOUNTER — Encounter (INDEPENDENT_AMBULATORY_CARE_PROVIDER_SITE_OTHER): Payer: Self-pay

## 2014-06-18 DIAGNOSIS — J4540 Moderate persistent asthma, uncomplicated: Secondary | ICD-10-CM | POA: Diagnosis not present

## 2014-06-18 MED ORDER — OMALIZUMAB 150 MG ~~LOC~~ SOLR
300.0000 mg | Freq: Once | SUBCUTANEOUS | Status: AC
  Administered 2014-06-18: 300 mg via SUBCUTANEOUS

## 2014-06-25 ENCOUNTER — Other Ambulatory Visit: Payer: Self-pay | Admitting: Critical Care Medicine

## 2014-07-05 ENCOUNTER — Ambulatory Visit (INDEPENDENT_AMBULATORY_CARE_PROVIDER_SITE_OTHER): Payer: 59

## 2014-07-05 ENCOUNTER — Telehealth: Payer: Self-pay | Admitting: Critical Care Medicine

## 2014-07-05 DIAGNOSIS — J4540 Moderate persistent asthma, uncomplicated: Secondary | ICD-10-CM

## 2014-07-05 NOTE — Telephone Encounter (Signed)
#   Vials:4 Arrival Date:07/05/14 Lot #:[id number] Exp Date:12/19   Pt. Brings in pt. xolair.

## 2014-07-06 MED ORDER — OMALIZUMAB 150 MG ~~LOC~~ SOLR
300.0000 mg | Freq: Once | SUBCUTANEOUS | Status: AC
  Administered 2014-07-05: 300 mg via SUBCUTANEOUS

## 2014-07-27 ENCOUNTER — Ambulatory Visit (INDEPENDENT_AMBULATORY_CARE_PROVIDER_SITE_OTHER): Payer: 59

## 2014-07-27 DIAGNOSIS — J4520 Mild intermittent asthma, uncomplicated: Secondary | ICD-10-CM | POA: Diagnosis not present

## 2014-07-30 ENCOUNTER — Other Ambulatory Visit: Payer: Self-pay

## 2014-07-31 MED ORDER — OMALIZUMAB 150 MG ~~LOC~~ SOLR
300.0000 mg | Freq: Once | SUBCUTANEOUS | Status: AC
  Administered 2014-07-27: 300 mg via SUBCUTANEOUS

## 2014-08-13 ENCOUNTER — Ambulatory Visit (INDEPENDENT_AMBULATORY_CARE_PROVIDER_SITE_OTHER): Payer: 59

## 2014-08-13 ENCOUNTER — Telehealth: Payer: Self-pay | Admitting: Critical Care Medicine

## 2014-08-13 DIAGNOSIS — J4520 Mild intermittent asthma, uncomplicated: Secondary | ICD-10-CM | POA: Diagnosis not present

## 2014-08-13 NOTE — Telephone Encounter (Signed)
#   vials:4 Ordered date:08/10/14 Shipping Date:pt. Either p/u xolair 7/8 or 08/13/14. (pt. P/u xolair and brings it here to get her shots.)   # Vials:4 Arrival Date:08/13/14 Lot #[id number] Exp Date:2/20

## 2014-08-14 MED ORDER — OMALIZUMAB 150 MG ~~LOC~~ SOLR
300.0000 mg | Freq: Once | SUBCUTANEOUS | Status: AC
  Administered 2014-08-13: 300 mg via SUBCUTANEOUS

## 2014-08-27 ENCOUNTER — Ambulatory Visit: Payer: 59

## 2014-08-31 ENCOUNTER — Ambulatory Visit (INDEPENDENT_AMBULATORY_CARE_PROVIDER_SITE_OTHER): Payer: 59

## 2014-08-31 DIAGNOSIS — J4540 Moderate persistent asthma, uncomplicated: Secondary | ICD-10-CM

## 2014-09-03 ENCOUNTER — Ambulatory Visit: Payer: 59

## 2014-09-03 MED ORDER — OMALIZUMAB 150 MG ~~LOC~~ SOLR
300.0000 mg | Freq: Once | SUBCUTANEOUS | Status: AC
  Administered 2014-09-03: 300 mg via SUBCUTANEOUS

## 2014-09-17 ENCOUNTER — Ambulatory Visit: Payer: 59

## 2014-09-20 ENCOUNTER — Telehealth: Payer: Self-pay | Admitting: Critical Care Medicine

## 2014-09-20 ENCOUNTER — Ambulatory Visit (INDEPENDENT_AMBULATORY_CARE_PROVIDER_SITE_OTHER): Payer: 59

## 2014-09-20 DIAGNOSIS — J4540 Moderate persistent asthma, uncomplicated: Secondary | ICD-10-CM

## 2014-09-20 NOTE — Telephone Encounter (Signed)
#   vials:4 Ordered date: Pt. Brings meds in q 4 wks. & gets her inj.s q 2 wks. Shipping Date:Pt. Picks up  # Vials:4 Arrival Date:09/20/14 Lot #:[id number] Exp Date:2/20

## 2014-09-21 MED ORDER — OMALIZUMAB 150 MG ~~LOC~~ SOLR
300.0000 mg | Freq: Once | SUBCUTANEOUS | Status: AC
  Administered 2014-09-20: 300 mg via SUBCUTANEOUS

## 2014-09-28 ENCOUNTER — Other Ambulatory Visit: Payer: Self-pay | Admitting: Pulmonary Disease

## 2014-09-28 MED ORDER — MONTELUKAST SODIUM 10 MG PO TABS: 10.0000 mg | ORAL_TABLET | Freq: Every day | ORAL | Status: DC

## 2014-10-04 ENCOUNTER — Ambulatory Visit: Payer: 59

## 2014-11-01 ENCOUNTER — Ambulatory Visit (INDEPENDENT_AMBULATORY_CARE_PROVIDER_SITE_OTHER): Payer: Self-pay

## 2014-11-01 DIAGNOSIS — F4320 Adjustment disorder, unspecified: Secondary | ICD-10-CM

## 2014-11-02 ENCOUNTER — Ambulatory Visit (INDEPENDENT_AMBULATORY_CARE_PROVIDER_SITE_OTHER): Payer: 59

## 2014-11-02 DIAGNOSIS — J4540 Moderate persistent asthma, uncomplicated: Secondary | ICD-10-CM | POA: Diagnosis not present

## 2014-11-05 MED ORDER — OMALIZUMAB 150 MG ~~LOC~~ SOLR
300.0000 mg | Freq: Once | SUBCUTANEOUS | Status: AC
  Administered 2014-11-02: 300 mg via SUBCUTANEOUS

## 2014-11-12 ENCOUNTER — Encounter: Payer: Self-pay | Admitting: Pulmonary Disease

## 2014-11-12 ENCOUNTER — Ambulatory Visit (INDEPENDENT_AMBULATORY_CARE_PROVIDER_SITE_OTHER): Payer: 59 | Admitting: Pulmonary Disease

## 2014-11-12 VITALS — BP 130/70 | HR 58 | Ht 64.0 in | Wt 173.6 lb

## 2014-11-12 DIAGNOSIS — J4551 Severe persistent asthma with (acute) exacerbation: Secondary | ICD-10-CM | POA: Diagnosis not present

## 2014-11-12 MED ORDER — MOMETASONE FURO-FORMOTEROL FUM 200-5 MCG/ACT IN AERO: 2.0000 | INHALATION_SPRAY | Freq: Two times a day (BID) | RESPIRATORY_TRACT | Status: DC

## 2014-11-12 NOTE — Progress Notes (Signed)
Subjective:    Patient ID: Elaine Luna, female    DOB: [birth year], 69 y.o.   MRN: [id number]  Synopsis: Severe persistent asthma followed by Dr. Joya Gaskins prior to 2016, on [place].  HPI Chief Complaint  Patient presents with  . Follow-up    Former [facility] pt following up for Asthma.  pt recieving xolair .pt states she is doing well. no concerns at this time.  Needs refill on QVAR   Sadly Elaine Luna son committed suicide proximally 7 weeks ago, she's been dealing with the stress of this. She said that she missed her Xolair for approximately 3 weeks during this time and she said that her asthma flared up quite a bit. She did not have to go to the doctor's office for prednisone but she did use her albuterol a bit more frequently. However when she came back from [place] last week she started taking Xolair again and she says her symptoms are back under better control. She continues taking the [facility], the Qvar, and albuterol as needed.  Past Medical History  Diagnosis Date  . Asthma   . Hyperlipidemia   . Hypertension   . Tubular adenoma   . Colon polyps   . Obesity   . Prinzmetal angina ([place])   . DM type 2 (diabetes mellitus, type 2) ([place]) 2015       Review of Systems     Objective:   Physical Exam Filed Vitals:   11/12/14 1638  BP: 130/70  Pulse: 58  Height: 5\' 4"  (1.626 m)  Weight: 173 lb 9.6 oz (78.744 kg)  SpO2: 97%    Gen: well appearing HENT: OP clear, TM's clear, neck supple PULM: CTA B, normal percussion CV: RRR, no mgr, trace edema GI: BS+, soft, nontender Derm: no cyanosis or rash Psyche: normal mood and affect       Assessment & Plan:  Not well controlled severe persistent asthma with acute exacerbation Elaine Luna has severe persistent asthma which requires Xolair for control. Since starting Xolair she says that her symptoms have been better controlled this to around. She actually stop taking Xolair at one point several years ago but had to start back about a  year ago when she was having recurrent exacerbations. Currently her symptoms are fairly well controlled on Dulera, Qvar, and Singulair.  Plan: Continue Xolair every 2 weeks at current dosing Continue Dulera Continue Qvar Follow-up 3 months or sooner if needed     Current outpatient prescriptions:  .  acetaminophen (TYLENOL) 325 MG tablet, Take 650 mg by mouth every 6 (six) hours as needed for fever (alternating motrin)., Disp: , Rfl:  .  albuterol (PROVENTIL) (2.5 MG/3ML) 0.083% nebulizer solution, Take 3 mLs (2.5 mg total) by nebulization every 6 (six) hours as needed for wheezing., Disp: 75 mL, Rfl: 6 .  atorvastatin (LIPITOR) 20 MG tablet, Take 20 mg by mouth daily., Disp: , Rfl:  .  beclomethasone (QVAR) 80 MCG/ACT inhaler, Inhale 2 puffs into the lungs 2 (two) times daily., Disp: 1 Inhaler, Rfl: 3 .  chlorpheniramine-HYDROcodone (TUSSIONEX PENNKINETIC ER) 10-8 MG/5ML LQCR, Take 5 mLs by mouth at bedtime as needed for cough., Disp: 115 mL, Rfl: 0 .  Denosumab (PROLIA [place]), Inject into the skin every 6 (six) months., Disp: , Rfl:  .  EPINEPHrine 0.3 mg/0.3 mL IJ SOAJ injection, Inject 0.3 mLs (0.3 mg total) into the muscle once., Disp: 1 Device, Rfl: 6 .  hydrochlorothiazide (MICROZIDE) 12.5 MG capsule, Take 12.5 mg by mouth daily.,  Disp: , Rfl:  .  ibuprofen (ADVIL,MOTRIN) 200 MG tablet, Take 400 mg by mouth every 8 (eight) hours as needed for fever (alternating with tylenol)., Disp: , Rfl:  .  irbesartan (AVAPRO) 300 MG tablet, Take 300 mg by mouth daily. , Disp: , Rfl:  .  levofloxacin (LEVAQUIN) 750 MG tablet, Take 1 tablet (750 mg total) by mouth daily., Disp: 5 tablet, Rfl: 0 .  metFORMIN (GLUCOPHAGE-XR) 500 MG 24 hr tablet, Take 1 tablet by mouth daily., Disp: , Rfl:  .  mometasone-formoterol (DULERA) 200-5 MCG/ACT AERO, Inhale 2 puffs into the lungs 2 (two) times daily., Disp: 1 Inhaler, Rfl: 11 .  montelukast (SINGULAIR) 10 MG tablet, Take 1 tablet (10 mg total) by mouth daily.,  Disp: 90 tablet, Rfl: 3 .  predniSONE (DELTASONE) 10 MG tablet, Take 4 tabs daily x 2 days, 3 tabs daily x 2 days, 2 tabs daily x 2 days, 1 tab daily x 2 days, Disp: 20 tablet, Rfl: 0 .  VENTOLIN HFA 108 (90 BASE) MCG/ACT inhaler, INHALE 1-2 PUFFS FOUR TIMES DAILY AS NEEDED, Disp: 18 g, Rfl: 5 .  Vitamin D, Ergocalciferol, (DRISDOL) 50000 UNITS CAPS, Take 50,000 Units by mouth. Twice weekly, Disp: , Rfl:  .  XOLAIR 150 MG injection, INJECT 300 MG INTO THE SKIN EVERY 14 DAYS., Disp: 4 vial, Rfl: 5

## 2014-11-12 NOTE — Patient Instructions (Addendum)
Keep taking your Dulera and QVar as you are doing, but I'm okay with you decreasing the Qvar to 1 puff twice a day if your symptoms have been stable for a month I will plan on seeing you back in 3 months or sooner if needed

## 2014-11-12 NOTE — Assessment & Plan Note (Signed)
Elaine Luna has severe persistent asthma which requires Xolair for control. Since starting Xolair she says that her symptoms have been better controlled this to around. She actually stop taking Xolair at one point several years ago but had to start back about a year ago when she was having recurrent exacerbations. Currently her symptoms are fairly well controlled on Dulera, Qvar, and Singulair.  Plan: Continue Xolair every 2 weeks at current dosing Continue Dulera Continue Qvar Follow-up 3 months or sooner if needed

## 2014-11-15 ENCOUNTER — Ambulatory Visit: Payer: 59

## 2014-11-16 ENCOUNTER — Ambulatory Visit (INDEPENDENT_AMBULATORY_CARE_PROVIDER_SITE_OTHER): Payer: 59

## 2014-11-16 ENCOUNTER — Telehealth: Payer: Self-pay | Admitting: Pulmonary Disease

## 2014-11-16 DIAGNOSIS — J4520 Mild intermittent asthma, uncomplicated: Secondary | ICD-10-CM | POA: Diagnosis not present

## 2014-11-16 NOTE — Telephone Encounter (Signed)
#   Vials:4 Arrival Date:11/16/14 Lot #:[id number] Exp Date:5/20

## 2014-11-16 NOTE — Telephone Encounter (Signed)
#   vials:4 Ordered date:11/09/14 Pt. Orders and brings med. In once a month. Shipping Date:11/16/14

## 2014-11-19 MED ORDER — OMALIZUMAB 150 MG ~~LOC~~ SOLR
300.0000 mg | Freq: Once | SUBCUTANEOUS | Status: AC
  Administered 2014-11-19: 300 mg via SUBCUTANEOUS

## 2014-11-20 ENCOUNTER — Ambulatory Visit (INDEPENDENT_AMBULATORY_CARE_PROVIDER_SITE_OTHER): Payer: Self-pay

## 2014-11-20 DIAGNOSIS — F4320 Adjustment disorder, unspecified: Secondary | ICD-10-CM

## 2014-12-05 ENCOUNTER — Ambulatory Visit (INDEPENDENT_AMBULATORY_CARE_PROVIDER_SITE_OTHER): Payer: 59

## 2014-12-05 ENCOUNTER — Ambulatory Visit: Payer: Self-pay

## 2014-12-05 DIAGNOSIS — J4540 Moderate persistent asthma, uncomplicated: Secondary | ICD-10-CM | POA: Diagnosis not present

## 2014-12-06 ENCOUNTER — Ambulatory Visit: Payer: 59

## 2014-12-06 MED ORDER — OMALIZUMAB 150 MG ~~LOC~~ SOLR
300.0000 mg | Freq: Once | SUBCUTANEOUS | Status: AC
  Administered 2014-12-05: 300 mg via SUBCUTANEOUS

## 2014-12-07 ENCOUNTER — Other Ambulatory Visit: Payer: Self-pay | Admitting: Critical Care Medicine

## 2014-12-10 ENCOUNTER — Other Ambulatory Visit: Payer: Self-pay

## 2014-12-10 MED ORDER — VENTOLIN HFA 108 (90 BASE) MCG/ACT IN AERS: INHALATION_SPRAY | RESPIRATORY_TRACT | Status: DC

## 2014-12-17 ENCOUNTER — Telehealth: Payer: Self-pay | Admitting: Pulmonary Disease

## 2014-12-17 MED ORDER — PREDNISONE 10 MG PO TABS: ORAL_TABLET | ORAL | Status: DC

## 2014-12-17 MED ORDER — LEVOFLOXACIN 500 MG PO TABS: 500.0000 mg | ORAL_TABLET | Freq: Every day | ORAL | Status: DC

## 2014-12-17 NOTE — Telephone Encounter (Addendum)
Pt aware of rec's per PM.  Aware that Levaquin and Pred are being called into [facility] outpatient pharmacy. Levaquin dosing is 500mg  per PM Nothing further needed.

## 2014-12-17 NOTE — Telephone Encounter (Signed)
Pt cb, [phone]

## 2014-12-17 NOTE — Telephone Encounter (Signed)
Patient calling back regarding message below. Dr. Vaughan Browner, please advise in Dr. Anastasia Pall absence Thanks.

## 2014-12-17 NOTE — Telephone Encounter (Signed)
Patient has been coughing (productive), wheezing x 1 week.  Mucus is starting to turn yellow.  Some chest tightness.  Patient wants to get this treated before she has a full blown asthma attack.  [place]  No Known Allergies

## 2014-12-17 NOTE — Telephone Encounter (Signed)
Levaquin for 7 days. Prednisone starting at 40 mg. Reduce by 10 mg every 3 days.

## 2014-12-19 ENCOUNTER — Ambulatory Visit: Payer: Self-pay

## 2014-12-20 ENCOUNTER — Ambulatory Visit (INDEPENDENT_AMBULATORY_CARE_PROVIDER_SITE_OTHER): Payer: 59

## 2014-12-20 ENCOUNTER — Telehealth: Payer: Self-pay

## 2014-12-20 DIAGNOSIS — J4540 Moderate persistent asthma, uncomplicated: Secondary | ICD-10-CM | POA: Diagnosis not present

## 2014-12-20 MED ORDER — OMALIZUMAB 150 MG ~~LOC~~ SOLR
300.0000 mg | SUBCUTANEOUS | Status: DC
Start: 2014-12-20 — End: 2014-12-20
  Administered 2014-12-20: 300 mg via SUBCUTANEOUS

## 2014-12-20 NOTE — Telephone Encounter (Signed)
*  pt brings in own medication* # Vials:4 Arrival Date:12/20/2014 Lot [id number] Exp Date:07/2018

## 2015-01-03 ENCOUNTER — Ambulatory Visit (INDEPENDENT_AMBULATORY_CARE_PROVIDER_SITE_OTHER): Payer: 59

## 2015-01-03 DIAGNOSIS — J4540 Moderate persistent asthma, uncomplicated: Secondary | ICD-10-CM

## 2015-01-04 MED ORDER — OMALIZUMAB 150 MG ~~LOC~~ SOLR
300.0000 mg | Freq: Once | SUBCUTANEOUS | Status: AC
  Administered 2015-01-03: 300 mg via SUBCUTANEOUS

## 2015-01-17 ENCOUNTER — Ambulatory Visit (INDEPENDENT_AMBULATORY_CARE_PROVIDER_SITE_OTHER): Payer: 59

## 2015-01-17 ENCOUNTER — Telehealth: Payer: Self-pay | Admitting: Pulmonary Disease

## 2015-01-17 DIAGNOSIS — J4520 Mild intermittent asthma, uncomplicated: Secondary | ICD-10-CM

## 2015-01-17 NOTE — Telephone Encounter (Signed)
#   Vials:4 Arrival Date:01/17/15 Lot [id number] Exp Date:6/20

## 2015-01-18 MED ORDER — OMALIZUMAB 150 MG ~~LOC~~ SOLR
300.0000 mg | Freq: Once | SUBCUTANEOUS | Status: AC
  Administered 2015-01-18: 300 mg via SUBCUTANEOUS

## 2015-01-31 ENCOUNTER — Ambulatory Visit: Payer: 59

## 2015-01-31 ENCOUNTER — Ambulatory Visit (INDEPENDENT_AMBULATORY_CARE_PROVIDER_SITE_OTHER): Payer: 59

## 2015-01-31 DIAGNOSIS — Z23 Encounter for immunization: Secondary | ICD-10-CM | POA: Diagnosis not present

## 2015-01-31 DIAGNOSIS — J4540 Moderate persistent asthma, uncomplicated: Secondary | ICD-10-CM | POA: Diagnosis not present

## 2015-02-01 MED ORDER — OMALIZUMAB 150 MG ~~LOC~~ SOLR
300.0000 mg | Freq: Once | SUBCUTANEOUS | Status: AC
  Administered 2015-01-31: 300 mg via SUBCUTANEOUS

## 2015-02-05 ENCOUNTER — Other Ambulatory Visit: Payer: Self-pay | Admitting: Critical Care Medicine

## 2015-02-05 MED FILL — ATORVASTATIN 20 MG TABLET: 20 | 90 days supply | Qty: 90 | Fill #2

## 2015-02-08 ENCOUNTER — Other Ambulatory Visit: Payer: Self-pay

## 2015-02-08 DIAGNOSIS — J4551 Severe persistent asthma with (acute) exacerbation: Secondary | ICD-10-CM

## 2015-02-08 MED ORDER — OMALIZUMAB 150 MG ~~LOC~~ SOLR: SUBCUTANEOUS | Status: DC

## 2015-02-11 MED FILL — XOLAIR 150 MG SOLR: 150 | 28 days supply | Qty: 4 | Fill #0

## 2015-02-19 ENCOUNTER — Other Ambulatory Visit (HOSPITAL_COMMUNITY): Payer: Self-pay | Admitting: *Deleted

## 2015-02-19 ENCOUNTER — Ambulatory Visit (INDEPENDENT_AMBULATORY_CARE_PROVIDER_SITE_OTHER): Payer: 59

## 2015-02-19 DIAGNOSIS — Z961 Presence of intraocular lens: Secondary | ICD-10-CM | POA: Diagnosis not present

## 2015-02-19 DIAGNOSIS — J4540 Moderate persistent asthma, uncomplicated: Secondary | ICD-10-CM

## 2015-02-19 DIAGNOSIS — E119 Type 2 diabetes mellitus without complications: Secondary | ICD-10-CM | POA: Diagnosis not present

## 2015-02-20 ENCOUNTER — Telehealth: Payer: Self-pay | Admitting: Pulmonary Disease

## 2015-02-20 ENCOUNTER — Encounter (HOSPITAL_COMMUNITY)
Admission: RE | Admit: 2015-02-20 | Discharge: 2015-02-20 | Disposition: A | Discharge status: Home / Self Care | Payer: 59 | Source: Ambulatory Visit | Attending: Internal Medicine | Admitting: Internal Medicine

## 2015-02-20 DIAGNOSIS — M810 Age-related osteoporosis without current pathological fracture: Secondary | ICD-10-CM | POA: Diagnosis not present

## 2015-02-20 MED ORDER — OMALIZUMAB 150 MG ~~LOC~~ SOLR
300.0000 mg | Freq: Once | SUBCUTANEOUS | Status: AC
  Administered 2015-02-19: 300 mg via SUBCUTANEOUS

## 2015-02-20 MED ORDER — DENOSUMAB 60 MG/ML ~~LOC~~ SOLN
60.0000 mg | Freq: Once | SUBCUTANEOUS | Status: AC
  Administered 2015-02-20: 60 mg via SUBCUTANEOUS
  Filled 2015-02-20: qty 1

## 2015-02-20 NOTE — Telephone Encounter (Signed)
#   Vials:4 Arrival Date:02/19/15 Lot [id number] Exp Date:6/20 I was so busy when she came in and left I forgot to make the encounter.

## 2015-02-25 ENCOUNTER — Other Ambulatory Visit: Payer: Self-pay | Admitting: Critical Care Medicine

## 2015-02-25 MED FILL — VIT D2 1.25 MG (50,000 UNIT: 1.25 MG | 42 days supply | Qty: 12 | Fill #0

## 2015-02-27 MED FILL — DULERA 200 MCG/5 MCG INH: 200-5 | 30 days supply | Qty: 13 | Fill #1

## 2015-02-28 ENCOUNTER — Other Ambulatory Visit: Payer: Self-pay

## 2015-02-28 MED FILL — QVAR 80 MCG ORAL INHALER: 80 | 30 days supply | Qty: 9 | Fill #0

## 2015-03-04 ENCOUNTER — Encounter: Payer: Self-pay | Admitting: Pulmonary Disease

## 2015-03-04 ENCOUNTER — Other Ambulatory Visit: Payer: 59

## 2015-03-04 ENCOUNTER — Ambulatory Visit (INDEPENDENT_AMBULATORY_CARE_PROVIDER_SITE_OTHER): Payer: 59 | Admitting: Pulmonary Disease

## 2015-03-04 VITALS — BP 122/64 | HR 59 | Ht 64.0 in | Wt 177.0 lb

## 2015-03-04 DIAGNOSIS — D802 Selective deficiency of immunoglobulin A [IgA]: Secondary | ICD-10-CM | POA: Diagnosis not present

## 2015-03-04 DIAGNOSIS — J4551 Severe persistent asthma with (acute) exacerbation: Secondary | ICD-10-CM

## 2015-03-04 DIAGNOSIS — R0602 Shortness of breath: Secondary | ICD-10-CM | POA: Diagnosis not present

## 2015-03-04 NOTE — Assessment & Plan Note (Signed)
Elaine Luna has severe persistent asthma and this time around she has not been responding to the Xolair as well as we would like. She has been treated for flares 3 times in the last 12 months and she continues to need albuterol on a daily basis. 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 Because of her ongoing mucus congestion and wheezing I'm concerned that this may be the case. Also, the differential diagnosis includes other forms of atypical infection and/or bronchiectasis.  Plan: High resolution CT scan to evaluate for bronchiectasis Aspergillus IgE panel Sputum culture for AFB, fungal, bacteria Continue Xolair for now Continue Dulera for now Continue Qvar for now Follow-up in 6 weeks, depending on the results of these tests will consider changing from Xolair to chronic steroids for empiric treatment of ABPA versus changing to a biologic therapy like one of the new IL5 inhibitors.

## 2015-03-04 NOTE — Progress Notes (Signed)
Subjective:    Patient ID: Elaine Luna, female    DOB: 1945/09/22, 70 y.o.   MRN: [id number]  Synopsis: Severe persistent asthma followed by Dr. Joya Gaskins prior to 2016, on [place].  HPI Chief Complaint  Patient presents with  . Follow-up    pt states she is at baseline.  tolerating xolair injections well.  Using rescue inhaler approx bid.     Elaine Luna is doing well.  She continues to take the QVar and Dulera and the ventolin twice a day.  She continues to feel chest congestion and dyspnea.  She is better than she was before the Xolair.  She is walkiong 400 steps per day.  Her blood sugar, A1c has been ok.  She continues to have significant chest congestion on a daily basis with clear to sometimes green mucus production. She denies fevers or chills. She does not feel like she is getting enough air. She needs to use albuterol twice a day. She remains compliant with the other medications. She continues taking Singulair.   Past Medical History  Diagnosis Date  . Asthma   . Hyperlipidemia   . Hypertension   . Tubular adenoma   . Colon polyps   . Obesity   . 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)   . DM type 2 (diabetes mellitus, type 2) ([place]) 2015       Review of Systems  Constitutional: Positive for fatigue. Negative for fever and chills.  HENT: Positive for rhinorrhea. Negative for postnasal drip and sinus pressure.   Respiratory: Positive for cough, shortness of breath and wheezing.   Cardiovascular: Negative for chest pain, palpitations and leg swelling.       Objective:   Physical Exam Filed Vitals:   03/04/15 1616  BP: 122/64  Pulse: 59  Height: 5\' 4"  (1.626 m)  Weight: 177 lb (80.287 kg)  SpO2: 98%    Gen: well appearing HENT: OP clear, TM's clear, neck supple PULM: CTA B, normal percussion CV: RRR, no mgr, trace edema GI: BS+, soft, nontender Derm: no cyanosis or rash Psyche: normal mood and affect  Labs from June 2015 reviewed were she had an elevated serum IgE  as well as a very elevated Aspergillus on a serum allergy profile.  CXR 04/2014 reviewed showing mild hyperinflation only     Assessment & Plan:  Not well controlled severe persistent asthma with acute exacerbation Elaine Luna has severe persistent asthma and this time around she has not been responding to the Xolair as well as we would like. She has been treated for flares 3 times in the last 12 months and she continues to need albuterol on a daily basis. 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 Because of her ongoing mucus congestion and wheezing I'm concerned that this may be the case. Also, the differential diagnosis includes other forms of atypical infection and/or bronchiectasis.  Plan: High resolution CT scan to evaluate for bronchiectasis Aspergillus IgE panel Sputum culture for AFB, fungal, bacteria Continue Xolair for now Continue Dulera for now Continue Qvar for now Follow-up in 6 weeks, depending on the results of these tests will consider changing from Xolair to chronic steroids for empiric treatment of ABPA versus changing to a biologic therapy like one of the new IL5 inhibitors.     Current outpatient prescriptions:  .  acetaminophen (TYLENOL) 325 MG  tablet, Take 650 mg by mouth every 6 (six) hours as needed for fever (alternating motrin)., Disp: , Rfl:  .  albuterol (PROVENTIL) (2.5 MG/3ML) 0.083% nebulizer solution, Take 3 mLs (2.5 mg total) by nebulization every 6 (six) hours as needed for wheezing., Disp: 75 mL, Rfl: 6 .  atorvastatin (LIPITOR) 20 MG tablet, Take 20 mg by mouth daily., Disp: , Rfl:  .  Denosumab (PROLIA [place]), Inject into the skin every 6 (six) months., Disp: , Rfl:  .  hydrochlorothiazide (MICROZIDE) 12.5 MG capsule, Take 12.5 mg by mouth daily., Disp: , Rfl:  .  irbesartan (AVAPRO) 300 MG tablet,  Take 300 mg by mouth daily. , Disp: , Rfl:  .  metFORMIN (GLUCOPHAGE-XR) 500 MG 24 hr tablet, Take 1 tablet by mouth daily., Disp: , Rfl:  .  mometasone-formoterol (DULERA) 200-5 MCG/ACT AERO, Inhale 2 puffs into the lungs 2 (two) times daily., Disp: 1 Inhaler, Rfl: 11 .  montelukast (SINGULAIR) 10 MG tablet, Take 1 tablet (10 mg total) by mouth daily., Disp: 90 tablet, Rfl: 3 .  omalizumab (XOLAIR) 150 MG injection, INJECT 300 MG INTO THE SKIN EVERY 14 DAYS., Disp: 4 vial, Rfl: 5 .  QVAR 80 MCG/ACT inhaler, INHALE 2 PUFFS INTO THE LUNGS 2 TIMES DAILY., Disp: 8.7 g, Rfl: PRN .  VENTOLIN HFA 108 (90 BASE) MCG/ACT inhaler, INHALE 1-2 PUFFS FOUR TIMES DAILY AS NEEDED, Disp: 18 g, Rfl: 5 .  Vitamin D, Ergocalciferol, (DRISDOL) 50000 UNITS CAPS, Take 50,000 Units by mouth. Twice weekly, Disp: , Rfl:

## 2015-03-04 NOTE — Patient Instructions (Signed)
Please provide [place] with a sample of your mucus We will call you with the results of the bloodwork and the CT scan Keep taking your medicines as you're doing, but we will regroup in 6 weeks and decide if we need to change the course of therapy.

## 2015-03-05 ENCOUNTER — Ambulatory Visit (INDEPENDENT_AMBULATORY_CARE_PROVIDER_SITE_OTHER): Payer: 59

## 2015-03-05 DIAGNOSIS — J4540 Moderate persistent asthma, uncomplicated: Secondary | ICD-10-CM

## 2015-03-06 MED ORDER — OMALIZUMAB 150 MG ~~LOC~~ SOLR
300.0000 mg | Freq: Once | SUBCUTANEOUS | Status: AC
  Administered 2015-03-05: 300 mg via SUBCUTANEOUS

## 2015-03-07 ENCOUNTER — Ambulatory Visit (INDEPENDENT_AMBULATORY_CARE_PROVIDER_SITE_OTHER)
Admission: RE | Admit: 2015-03-07 | Discharge: 2015-03-07 | Disposition: A | Discharge status: Home / Self Care | Payer: 59 | Source: Ambulatory Visit | Attending: Pulmonary Disease | Admitting: Pulmonary Disease

## 2015-03-07 DIAGNOSIS — R911 Solitary pulmonary nodule: Secondary | ICD-10-CM

## 2015-03-07 DIAGNOSIS — R0602 Shortness of breath: Secondary | ICD-10-CM | POA: Diagnosis not present

## 2015-03-07 DIAGNOSIS — R918 Other nonspecific abnormal finding of lung field: Secondary | ICD-10-CM | POA: Diagnosis not present

## 2015-03-08 ENCOUNTER — Telehealth: Payer: Self-pay | Admitting: Pulmonary Disease

## 2015-03-08 DIAGNOSIS — R911 Solitary pulmonary nodule: Secondary | ICD-10-CM | POA: Insufficient documentation

## 2015-03-08 LAB — ASPERGILLUS IGE PANEL
Aspergillus Amstel/glauc IgE: <0.35 kU/L (ref <0.35)
Aspergillus IgE Panel: 5.8 kU/L — ABNORMAL HIGH (ref <0.35)
Aspergillus nidulans IgE: <0.35 kU/L (ref <0.35)
CLASS: 0
CLASS: 0
CLASS: 0
CLASS: 0
Class: 0
Class: 3
Results Received-AspIgE: 0.21 kU/L (ref <0.35)

## 2015-03-08 NOTE — Telephone Encounter (Signed)
Pt called back, aware of results and recs.  Ct ordered.  Nothing further needed.

## 2015-03-08 NOTE — Telephone Encounter (Signed)
Notes Recorded by Juanito Doom, MD on 03/08/2015 at 8:53 AM A, Please let her know that this showed changes most suggestive of chronic asthma, some mild airway dilation which is from asthma. There were small, benign appearing nodules which means she should have another CT in one year. Please order. Thanks B ------------------------------- lmtcb for pt.

## 2015-03-11 MED FILL — METFORMIN HCL ER 500 MG TAB: 500 | 90 days supply | Qty: 90 | Fill #1

## 2015-03-11 MED FILL — IRBESARTAN 300 MG TABLET: 300 | 90 days supply | Qty: 90 | Fill #0

## 2015-03-13 MED FILL — XOLAIR 150 MG SOLR: 150 | 28 days supply | Qty: 4 | Fill #1

## 2015-03-18 ENCOUNTER — Telehealth: Payer: Self-pay | Admitting: Pulmonary Disease

## 2015-03-18 ENCOUNTER — Other Ambulatory Visit (HOSPITAL_COMMUNITY)
Admission: RE | Admit: 2015-03-18 | Discharge: 2015-03-18 | Disposition: A | Discharge status: Home / Self Care | Payer: 59 | Source: Ambulatory Visit | Attending: Pulmonary Disease | Admitting: Pulmonary Disease

## 2015-03-18 ENCOUNTER — Other Ambulatory Visit: Payer: Self-pay | Admitting: Pulmonary Disease

## 2015-03-18 DIAGNOSIS — J4550 Severe persistent asthma, uncomplicated: Secondary | ICD-10-CM | POA: Insufficient documentation

## 2015-03-18 NOTE — Telephone Encounter (Signed)
#   Vials:4 Arrival Date:03/18/15 Lot [id number]  Exp Date:6/20  Pt. Brings Lexicographer in. [place])

## 2015-03-18 NOTE — Telephone Encounter (Signed)
Spoke with Myriam Jacobson over at [facility] lab. Pt brought her sample to [facility] instead of our office. They can't see the order that was placed on 03/04/15.  BQ- can you place an order so that they lab at [facility] can see it?

## 2015-03-19 ENCOUNTER — Ambulatory Visit (INDEPENDENT_AMBULATORY_CARE_PROVIDER_SITE_OTHER): Payer: 59

## 2015-03-19 DIAGNOSIS — J4540 Moderate persistent asthma, uncomplicated: Secondary | ICD-10-CM | POA: Diagnosis not present

## 2015-03-19 LAB — EXPECTORATED SPUTUM ASSESSMENT W GRAM STAIN, RFLX TO RESP C

## 2015-03-19 LAB — EXPECTORATED SPUTUM ASSESSMENT W REFEX TO RESP CULTURE

## 2015-03-19 NOTE — Telephone Encounter (Signed)
Lab orders were replaced again yesterday 03/18/15 for lab collect and all is future. Called over to [facility] lab and was advised kim was busy and LM for her to call us back

## 2015-03-19 NOTE — Telephone Encounter (Signed)
Spoke wt/ Elaine Luna and she is still unable to see orders. She is going to call down and speak with our lab bc she is unsure how to even release an order. Elaine Luna is going to call us back with an update.

## 2015-03-19 NOTE — Telephone Encounter (Signed)
[facility] lab calling needing order for sputum that was brought to them  Please advise KIm [phone] .Elaine Luna

## 2015-03-19 NOTE — Telephone Encounter (Signed)
Re-wrote orders yesterday, not sure what else is needed; maybe I put them in wrong

## 2015-03-20 MED ORDER — OMALIZUMAB 150 MG ~~LOC~~ SOLR
300.0000 mg | Freq: Once | SUBCUTANEOUS | Status: AC
  Administered 2015-03-19: 300 mg via SUBCUTANEOUS

## 2015-03-20 NOTE — Telephone Encounter (Signed)
Specimen tests were run. These are in [place].

## 2015-03-22 LAB — CULTURE, RESPIRATORY W GRAM STAIN: Culture: NORMAL

## 2015-03-22 LAB — CULTURE, RESPIRATORY

## 2015-03-25 ENCOUNTER — Encounter: Payer: Self-pay | Admitting: Pulmonary Disease

## 2015-03-25 MED ORDER — LEVOFLOXACIN 500 MG PO TABS: 500.0000 mg | ORAL_TABLET | Freq: Every day | ORAL | Status: DC

## 2015-03-25 MED ORDER — PREDNISONE 10 MG PO TABS: ORAL_TABLET | ORAL | Status: DC

## 2015-03-25 MED FILL — predniSONE 10 MG TABS: 10 | 8 days supply | Qty: 15 | Fill #0

## 2015-03-25 MED FILL — levoFLOXacin 500 MG TABS: 500 | 5 days supply | Qty: 5 | Fill #0

## 2015-03-25 NOTE — Telephone Encounter (Signed)
rxs called to pharm and pt made aware

## 2015-03-25 NOTE — Telephone Encounter (Signed)
Spoke with the pt  She is c/o prod cough with yellow sputum, increased SOB and wheezing x 2 days  She does not have any CP, f/c/s, or other co's  She is requesting levaquin and pred taper  Will forward to MR since BQ is off this pm  Please advise, thanks!

## 2015-03-25 NOTE — Telephone Encounter (Signed)
Pt calling to try to get ABX.  Call it into [place] cb # is [phone]

## 2015-03-25 NOTE — Telephone Encounter (Signed)
take levaquin 500mg  once daily  X 5 days   Take prednisone 40 mg daily x 2 days, then 20mg  daily x 2 days, then 10mg  daily x 2 days, then 5mg  daily x 2 days and stop

## 2015-04-15 LAB — FUNGUS CULTURE W SMEAR: Fungal Smear: NONE SEEN

## 2015-04-18 ENCOUNTER — Ambulatory Visit (INDEPENDENT_AMBULATORY_CARE_PROVIDER_SITE_OTHER): Payer: 59

## 2015-04-18 DIAGNOSIS — J4540 Moderate persistent asthma, uncomplicated: Secondary | ICD-10-CM

## 2015-04-19 MED ORDER — OMALIZUMAB 150 MG ~~LOC~~ SOLR
300.0000 mg | Freq: Once | SUBCUTANEOUS | Status: AC
  Administered 2015-04-18: 300 mg via SUBCUTANEOUS

## 2015-04-23 MED FILL — XOLAIR 150 MG SOLR: 150 | 28 days supply | Qty: 4 | Fill #2

## 2015-04-25 MED FILL — VIT D2 1.25 MG (50,000 UNIT: 1.25 MG | 84 days supply | Qty: 24 | Fill #1

## 2015-04-25 MED FILL — ATORVASTATIN 20 MG TABLET: 20 | 90 days supply | Qty: 90 | Fill #3

## 2015-04-25 MED FILL — HYDROCHLOROTHIAZIDE 25 MG T: 25 | 90 days supply | Qty: 90 | Fill #1

## 2015-04-25 MED FILL — MONTELUKAST SOD 10 MG TAB: 10 | 90 days supply | Qty: 90 | Fill #2

## 2015-05-01 LAB — AFB CULTURE WITH SMEAR (NOT AT ARMC): ACID FAST SMEAR: NONE SEEN

## 2015-05-02 ENCOUNTER — Telehealth: Payer: Self-pay | Admitting: Pulmonary Disease

## 2015-05-02 ENCOUNTER — Ambulatory Visit (INDEPENDENT_AMBULATORY_CARE_PROVIDER_SITE_OTHER): Payer: 59

## 2015-05-02 DIAGNOSIS — J4540 Moderate persistent asthma, uncomplicated: Secondary | ICD-10-CM | POA: Diagnosis not present

## 2015-05-02 NOTE — Telephone Encounter (Signed)
#   Vials:4 Arrival Date:05/02/15 Lot [id number] Exp Date:9/20 Pt. Brings in [place].

## 2015-05-03 ENCOUNTER — Encounter: Payer: Self-pay | Admitting: Pulmonary Disease

## 2015-05-03 ENCOUNTER — Ambulatory Visit (INDEPENDENT_AMBULATORY_CARE_PROVIDER_SITE_OTHER): Payer: 59 | Admitting: Pulmonary Disease

## 2015-05-03 VITALS — BP 130/66 | HR 58 | Ht 64.0 in | Wt 178.0 lb

## 2015-05-03 DIAGNOSIS — J4551 Severe persistent asthma with (acute) exacerbation: Secondary | ICD-10-CM

## 2015-05-03 DIAGNOSIS — R911 Solitary pulmonary nodule: Secondary | ICD-10-CM

## 2015-05-03 MED ORDER — OMALIZUMAB 150 MG ~~LOC~~ SOLR
300.0000 mg | Freq: Once | SUBCUTANEOUS | Status: AC
  Administered 2015-05-03: 300 mg via SUBCUTANEOUS

## 2015-05-03 MED ORDER — PREDNISONE 10 MG PO TABS: ORAL_TABLET | ORAL | Status: DC

## 2015-05-03 MED FILL — predniSONE 10 MG TABS: 10 | 90 days supply | Qty: 180 | Fill #0

## 2015-05-03 NOTE — Assessment & Plan Note (Signed)
Elaine Luna manages to fill well between her exacerbations but she continues to struggle with severe persistent asthma that is poorly controlled and that she keeps having repeated exacerbations. Since the last visit she's had 2 exacerbations requiring antibiotics and prednisone. She think it's related to her work environment where there is a number of rooms and she thinks there is some mold in the ceilings. Regardless, it's unclear to me that the addition of Xolair this time around is made a difference in her asthma control.  She has this mildly elevated aspergillus IgE specific antibody which is difficult to interpret in the presence of Xolair use. I do question whether or not there is a fungal cause to her repeated exacerbations.  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

## 2015-05-03 NOTE — Assessment & Plan Note (Signed)
Repeat CT chest 1 year

## 2015-05-03 NOTE — Progress Notes (Signed)
Subjective:    Patient ID: Elaine Luna, female    DOB: August 18, 1945, 70 y.o.   MRN: [id number]  Synopsis: Severe persistent asthma followed by Dr. Joya Gaskins prior to 2016, on [place].  aspergillus IgE antibody moderately +2017 High-resolution CT chest with mild cylindrical bronchiectasis and scattered sub-centimeter pulmonary nodules no bigger than 4 mm  HPI Chief Complaint  Patient presents with  . Follow-up    pt doing well.  pt had to have a pred taper and abx called in a few weeks ago, but no complaints today.     Elaine Luna has had two exacerbations requiring prednisone. She feels like there is a lot of mucs in her chest. She does fine between exacerbations. She is better between the exacerbations, she feels that the exacerbations are due to repeated infection.    Past Medical History  Diagnosis Date  . Asthma   . Hyperlipidemia   . Hypertension   . Tubular adenoma   . Colon polyps   . Obesity   . 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 DM type 2 (diabetes mellitus, type 2) ([place]) 2015       Review of Systems  Constitutional: Negative for fever, chills and fatigue.  HENT: Negative for postnasal drip, rhinorrhea and sinus pressure.   Respiratory: Negative for cough, shortness of breath and wheezing.   Cardiovascular: Negative for chest pain, palpitations and leg swelling.       Objective:   Physical Exam Filed Vitals:   05/03/15 0931  BP: 130/66  Pulse: 58  Height: 5\' 4"  (1.626 m)  Weight: 178 lb (80.74 kg)  SpO2: 98%    Gen: well appearing HENT: OP clear, TM's clear, neck supple PULM: CTA B, normal percussion CV: RRR, no mgr, trace edema GI: BS+, soft, nontender Derm: no cyanosis or rash Psyche: normal mood and affect  Labs reviewed showing an elevated aspergillus IgE panel  High-resolution CT chest images personally reviewed showing mild cylindrical bronchiectasis in the right middle lobe and scattered subcentimeter pulmonary nodules.     Assessment & Plan:    Not well controlled severe persistent asthma with acute exacerbation Elaine Luna manages to fill well between her exacerbations but she continues to struggle with severe persistent asthma that is poorly controlled and that she keeps having repeated exacerbations. Since the last visit she's had 2 exacerbations requiring antibiotics and prednisone. She think it's related to her work environment where there is a number of rooms and she thinks there is some mold in the ceilings. Regardless, it's unclear to me that the addition of Xolair this time around is made a difference in her asthma control.  She has this mildly elevated aspergillus IgE specific antibody which is difficult to interpret in the presence of Xolair use. I do question whether or not there is a fungal cause to her repeated exacerbations.  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  albuterol (PROVENTIL) (2.5 MG/3ML) 0.083% nebulizer solution, Take 3 mLs (2.5 mg total) by nebulization every 6 (six) hours as needed for wheezing., Disp: 75 mL, Rfl: 6 .  atorvastatin (LIPITOR) 20 MG tablet, Take 20 mg by mouth daily., Disp: , Rfl:  .  Denosumab (PROLIA [place]), Inject into the skin every 6 (six) months., Disp: , Rfl:  .  hydrochlorothiazide (MICROZIDE) 12.5 MG capsule, Take 12.5 mg by mouth daily., Disp: , Rfl:  .  irbesartan (AVAPRO) 300 MG tablet, Take 300 mg by mouth daily. , Disp: , Rfl:  .  metFORMIN (GLUCOPHAGE-XR) 500 MG 24 hr tablet, Take 1 tablet by mouth daily., Disp: , Rfl:  .  mometasone-formoterol (DULERA) 200-5 MCG/ACT AERO, Inhale 2 puffs into the lungs 2 (two) times daily., Disp: 1 Inhaler, Rfl: 11 .  montelukast (SINGULAIR) 10 MG tablet, Take 1 tablet (10 mg total) by mouth daily., Disp: 90  tablet, Rfl: 3 .  omalizumab (XOLAIR) 150 MG injection, INJECT 300 MG INTO THE SKIN EVERY 14 DAYS., Disp: 4 vial, Rfl: 5 .  QVAR 80 MCG/ACT inhaler, INHALE 2 PUFFS INTO THE LUNGS 2 TIMES DAILY., Disp: 8.7 g, Rfl: PRN .  VENTOLIN HFA 108 (90 BASE) MCG/ACT inhaler, INHALE 1-2 PUFFS FOUR TIMES DAILY AS NEEDED, Disp: 18 g, Rfl: 5 .  Vitamin D, Ergocalciferol, (DRISDOL) 50000 UNITS CAPS, Take 50,000 Units by mouth. Twice weekly, Disp: , Rfl:  .  predniSONE (DELTASONE) 10 MG tablet, 30mg  daily for one month, then 20mg  daily for one month, then 10mg  daily for one month, Disp: 180 tablet, Rfl: 0

## 2015-05-03 NOTE — Patient Instructions (Signed)
Take prednisone 30 mg daily for one month followed by 20 mg daily for one month followed by 10 mg daily for one month Follow-up with me in 3 months Keep taking your medications as you're doing

## 2015-05-08 MED FILL — DULERA 200 MCG/5 MCG INH: 200-5 | 30 days supply | Qty: 13 | Fill #2

## 2015-05-08 MED FILL — QVAR 80 MCG ORAL INHALER: 80 | 30 days supply | Qty: 9 | Fill #1

## 2015-05-08 MED FILL — VENTOLIN HFA 90 MCG INHALER: 108 (90 BAS | 25 days supply | Qty: 18 | Fill #1

## 2015-05-20 ENCOUNTER — Ambulatory Visit: Payer: Self-pay

## 2015-05-21 ENCOUNTER — Ambulatory Visit (INDEPENDENT_AMBULATORY_CARE_PROVIDER_SITE_OTHER): Payer: 59

## 2015-05-21 DIAGNOSIS — J4540 Moderate persistent asthma, uncomplicated: Secondary | ICD-10-CM | POA: Diagnosis not present

## 2015-05-22 MED ORDER — OMALIZUMAB 150 MG ~~LOC~~ SOLR
300.0000 mg | Freq: Once | SUBCUTANEOUS | Status: AC
  Administered 2015-05-22: 300 mg via SUBCUTANEOUS

## 2015-06-04 MED FILL — XOLAIR 150 MG SOLR: 150 | 28 days supply | Qty: 4 | Fill #3

## 2015-06-11 ENCOUNTER — Ambulatory Visit (INDEPENDENT_AMBULATORY_CARE_PROVIDER_SITE_OTHER): Payer: 59

## 2015-06-11 ENCOUNTER — Telehealth: Payer: Self-pay | Admitting: Pulmonary Disease

## 2015-06-11 DIAGNOSIS — J4540 Moderate persistent asthma, uncomplicated: Secondary | ICD-10-CM

## 2015-06-11 NOTE — Telephone Encounter (Signed)
#   Vials:4 Arrival Date:06/11/15 Lot [id number] Exp Date:11/20

## 2015-06-12 MED ORDER — OMALIZUMAB 150 MG ~~LOC~~ SOLR
300.0000 mg | Freq: Once | SUBCUTANEOUS | Status: AC
  Administered 2015-06-11: 300 mg via SUBCUTANEOUS

## 2015-06-25 MED FILL — METFORMIN HCL ER 500 MG TAB: 500 | 30 days supply | Qty: 30 | Fill #0

## 2015-06-25 MED FILL — IRBESARTAN 300 MG TABLET: 300 | 90 days supply | Qty: 90 | Fill #1

## 2015-06-26 ENCOUNTER — Ambulatory Visit (INDEPENDENT_AMBULATORY_CARE_PROVIDER_SITE_OTHER): Payer: 59

## 2015-06-26 DIAGNOSIS — J4540 Moderate persistent asthma, uncomplicated: Secondary | ICD-10-CM

## 2015-06-26 MED ORDER — OMALIZUMAB 150 MG ~~LOC~~ SOLR
300.0000 mg | Freq: Once | SUBCUTANEOUS | Status: AC
  Administered 2015-06-26: 300 mg via SUBCUTANEOUS

## 2015-07-09 ENCOUNTER — Ambulatory Visit (HOSPITAL_BASED_OUTPATIENT_CLINIC_OR_DEPARTMENT_OTHER): Payer: 59 | Admitting: Pharmacist

## 2015-07-09 DIAGNOSIS — J4551 Severe persistent asthma with (acute) exacerbation: Secondary | ICD-10-CM

## 2015-07-09 MED ORDER — OMALIZUMAB 150 MG ~~LOC~~ SOLR: SUBCUTANEOUS | Status: DC

## 2015-07-09 MED FILL — XOLAIR 150 MG SOLR: 150 | 28 days supply | Qty: 4 | Fill #0

## 2015-07-09 NOTE — Progress Notes (Signed)
S: Patient presents today to the [facility].  Patient is currently taking Xolair for severe asthma and allergies. Patient is managed by Dr. Lake Bells for this.   Adherence: denies any missed doses and the nurse rotates her injection sites.  Dosing: Give subcutaneously.  Can be dosed every 2 or 4 weeks based on baseline serum IgE levels and body weight.  Asthma: SubQ: Dose and frequency based on body weight and pretreatment total IgE serum levels. Dosing should be adjusted during therapy for significant changes in body weight. Dosing should not be adjusted based on total IgE levels taken during treatment or <1 year following interruption of therapy. If therapy has been interrupted for ?1 year, total IgE levels may be re-evaluated for dosage determination.  Pretreatment serum IgE >300 to 400 units/mL (her IgE = 399.1 on 07/26/13):  >70 to 90 kg: 300 mg every 2 weeks   Drug-drug interactions: none  Monitoring: CV effects: denies Eosinophilia and vasculitis: denies Fever/arthralgia/rash: denies Hypersensitivity/Anaphylaxis: denies - has EpiPen Malignant neoplasms: denies Pulmonary function tests: followed by pulmonology  O:     Lab Results  Component Value Date   WBC 9.5 05/10/2012   HGB 13.5 05/10/2012   HCT 38.6 05/10/2012   MCV 88.1 05/10/2012   PLT 300 05/10/2012      Chemistry      Component Value Date/Time   NA 143 02/27/2008 1205   K 3.9 02/27/2008 1205   CL 107 02/27/2008 1205   CO2 31 02/27/2008 1205   BUN 11 02/27/2008 1205   CREATININE 0.7 02/27/2008 1205      Component Value Date/Time   CALCIUM 9.6 02/27/2008 1205   ALKPHOS 83 02/27/2008 1205   AST 21 02/27/2008 1205   ALT 23 02/27/2008 1205   BILITOT 1.4* 02/27/2008 1205       A/P: 1. Medication review: Patient is tolerating Xolair for severe asthma with no adverse effects but per Dr. Lake Bells, it is unclear if Xolair is providing any benefit. No  recommendations for change at this time. Patient to follow up with pulmonology as directed.    Nicoletta Ba, PharmD, BCPS, [place] [facility] [phone]  Evaluation and management procedures were performed by the Clinical Pharmacy Practitioner under my supervision and collaboration. I have reviewed the Practitioner's note and chart, and I agree with the management and plan as documented above.   Angelica Chessman, MD, [place], [place], [place], [place] and [place], [place]   07/09/2015, 5:25 PM

## 2015-07-09 NOTE — Addendum Note (Signed)
Addended by: Nicoletta Ba A on: 07/09/2015 03:08 PM   Modules accepted: Orders

## 2015-07-11 ENCOUNTER — Ambulatory Visit (INDEPENDENT_AMBULATORY_CARE_PROVIDER_SITE_OTHER): Payer: 59

## 2015-07-11 ENCOUNTER — Telehealth: Payer: Self-pay | Admitting: Pulmonary Disease

## 2015-07-11 DIAGNOSIS — J4540 Moderate persistent asthma, uncomplicated: Secondary | ICD-10-CM | POA: Diagnosis not present

## 2015-07-11 NOTE — Telephone Encounter (Signed)
#   Vials:4 Arrival Date:07/11/15 Pt. Brings med. In. Lot [id number] Exp Date:12/20

## 2015-07-12 ENCOUNTER — Ambulatory Visit: Payer: Self-pay

## 2015-07-12 MED ORDER — OMALIZUMAB 150 MG ~~LOC~~ SOLR
300.0000 mg | Freq: Once | SUBCUTANEOUS | Status: AC
  Administered 2015-07-12: 300 mg via SUBCUTANEOUS

## 2015-07-15 MED FILL — ATORVASTATIN 20 MG TABLET: 20 | 90 days supply | Qty: 90 | Fill #0

## 2015-07-15 MED FILL — METFORMIN HCL ER 500 MG TAB: 500 | 90 days supply | Qty: 90 | Fill #1

## 2015-07-15 MED FILL — MONTELUKAST SOD 10 MG TAB: 10 | 90 days supply | Qty: 90 | Fill #3

## 2015-07-15 MED FILL — VIT D2 1.25 MG (50,000 UNIT: 1.25 MG | 84 days supply | Qty: 24 | Fill #2

## 2015-07-15 MED FILL — HYDROCHLOROTHIAZIDE 25 MG T: 25 | 90 days supply | Qty: 90 | Fill #2

## 2015-08-02 DIAGNOSIS — J45909 Unspecified asthma, uncomplicated: Secondary | ICD-10-CM | POA: Diagnosis not present

## 2015-08-02 DIAGNOSIS — E119 Type 2 diabetes mellitus without complications: Secondary | ICD-10-CM | POA: Diagnosis not present

## 2015-08-02 DIAGNOSIS — E784 Other hyperlipidemia: Secondary | ICD-10-CM | POA: Diagnosis not present

## 2015-08-02 DIAGNOSIS — N183 Chronic kidney disease, stage 3 (moderate): Secondary | ICD-10-CM | POA: Diagnosis not present

## 2015-08-02 DIAGNOSIS — Z6830 Body mass index (BMI) 30.0-30.9, adult: Secondary | ICD-10-CM | POA: Diagnosis not present

## 2015-08-02 DIAGNOSIS — Z1389 Encounter for screening for other disorder: Secondary | ICD-10-CM | POA: Diagnosis not present

## 2015-08-02 DIAGNOSIS — I10 Essential (primary) hypertension: Secondary | ICD-10-CM | POA: Diagnosis not present

## 2015-08-02 DIAGNOSIS — E668 Other obesity: Secondary | ICD-10-CM | POA: Diagnosis not present

## 2015-08-13 ENCOUNTER — Ambulatory Visit: Payer: 59

## 2015-08-13 ENCOUNTER — Ambulatory Visit (INDEPENDENT_AMBULATORY_CARE_PROVIDER_SITE_OTHER): Payer: 59

## 2015-08-13 DIAGNOSIS — J4540 Moderate persistent asthma, uncomplicated: Secondary | ICD-10-CM

## 2015-08-14 MED ORDER — OMALIZUMAB 150 MG ~~LOC~~ SOLR
300.0000 mg | Freq: Once | SUBCUTANEOUS | Status: AC
  Administered 2015-08-13: 300 mg via SUBCUTANEOUS

## 2015-08-21 MED FILL — QVAR 80 MCG ORAL INHALER: 80 | 30 days supply | Qty: 9 | Fill #2

## 2015-09-02 MED FILL — XOLAIR 150 MG SOLR: 150 | 28 days supply | Qty: 4 | Fill #1

## 2015-09-10 ENCOUNTER — Ambulatory Visit (INDEPENDENT_AMBULATORY_CARE_PROVIDER_SITE_OTHER): Payer: 59

## 2015-09-10 ENCOUNTER — Telehealth: Payer: Self-pay | Admitting: Pulmonary Disease

## 2015-09-10 DIAGNOSIS — J4540 Moderate persistent asthma, uncomplicated: Secondary | ICD-10-CM

## 2015-09-10 MED ORDER — OMALIZUMAB 150 MG ~~LOC~~ SOLR
300.0000 mg | SUBCUTANEOUS | Status: DC
  Administered 2015-09-10 – 2015-09-24 (×2): 300 mg via SUBCUTANEOUS

## 2015-09-10 NOTE — Telephone Encounter (Signed)
#   Vials:4 Arrival Date:09/10/15 Lot [id number] Exp Date: 12/20 Pt. Brings med.s in.

## 2015-09-24 ENCOUNTER — Ambulatory Visit (INDEPENDENT_AMBULATORY_CARE_PROVIDER_SITE_OTHER): Payer: 59

## 2015-09-24 DIAGNOSIS — J4540 Moderate persistent asthma, uncomplicated: Secondary | ICD-10-CM | POA: Diagnosis not present

## 2015-09-25 MED ORDER — OMALIZUMAB 150 MG ~~LOC~~ SOLR: 300.0000 mg | SUBCUTANEOUS | Status: DC

## 2015-10-17 ENCOUNTER — Other Ambulatory Visit: Payer: Self-pay | Admitting: Pharmacist

## 2015-10-17 MED ORDER — OMALIZUMAB 150 MG ~~LOC~~ SOLR: SUBCUTANEOUS | 5 refills | Status: DC

## 2015-10-17 MED FILL — XOLAIR 150 MG SOLR: 150 | 28 days supply | Qty: 4 | Fill #0

## 2015-10-17 MED FILL — IRBESARTAN 300 MG TABLET: 300 | 90 days supply | Qty: 90 | Fill #2

## 2015-10-25 ENCOUNTER — Ambulatory Visit (INDEPENDENT_AMBULATORY_CARE_PROVIDER_SITE_OTHER): Payer: 59

## 2015-10-25 ENCOUNTER — Telehealth: Payer: Self-pay | Admitting: Pulmonary Disease

## 2015-10-25 DIAGNOSIS — J4540 Moderate persistent asthma, uncomplicated: Secondary | ICD-10-CM

## 2015-10-25 NOTE — Telephone Encounter (Signed)
#   Vials:4 Arrival Date:10/25/15 Lot [id number] Exp Date:3/21

## 2015-10-28 MED ORDER — OMALIZUMAB 150 MG ~~LOC~~ SOLR
300.0000 mg | SUBCUTANEOUS | Status: DC
  Administered 2015-10-25: 300 mg via SUBCUTANEOUS

## 2015-11-15 ENCOUNTER — Ambulatory Visit (INDEPENDENT_AMBULATORY_CARE_PROVIDER_SITE_OTHER): Payer: 59

## 2015-11-15 DIAGNOSIS — J4540 Moderate persistent asthma, uncomplicated: Secondary | ICD-10-CM | POA: Diagnosis not present

## 2015-11-15 MED ORDER — OMALIZUMAB 150 MG ~~LOC~~ SOLR
150.0000 mg | Freq: Once | SUBCUTANEOUS | Status: AC
  Administered 2015-11-15: 150 mg via SUBCUTANEOUS

## 2015-11-21 MED FILL — QVAR 80 MCG ORAL INHALER: 80 | 30 days supply | Qty: 9 | Fill #3

## 2015-11-21 MED FILL — METFORMIN HCL ER 500 MG TAB: 500 | 60 days supply | Qty: 60 | Fill #2

## 2015-11-21 MED FILL — XOLAIR 150 MG SOLR: 150 | 28 days supply | Qty: 4 | Fill #1

## 2015-11-21 MED FILL — VIT D2 1.25 MG (50,000 UNIT: 1.25 MG | 41 days supply | Qty: 12 | Fill #3

## 2015-11-22 MED FILL — HYDROCHLOROTHIAZIDE 25 MG T: 25 | 90 days supply | Qty: 90 | Fill #0

## 2015-12-06 ENCOUNTER — Ambulatory Visit (INDEPENDENT_AMBULATORY_CARE_PROVIDER_SITE_OTHER): Payer: 59

## 2015-12-06 DIAGNOSIS — J4520 Mild intermittent asthma, uncomplicated: Secondary | ICD-10-CM

## 2015-12-06 MED ORDER — OMALIZUMAB 150 MG ~~LOC~~ SOLR
300.0000 mg | SUBCUTANEOUS | Status: DC
  Administered 2015-12-06: 300 mg via SUBCUTANEOUS

## 2015-12-30 DIAGNOSIS — E559 Vitamin D deficiency, unspecified: Secondary | ICD-10-CM | POA: Diagnosis not present

## 2015-12-30 DIAGNOSIS — E1129 Type 2 diabetes mellitus with other diabetic kidney complication: Secondary | ICD-10-CM | POA: Diagnosis not present

## 2015-12-30 DIAGNOSIS — Z0000 Encounter for general adult medical examination without abnormal findings: Secondary | ICD-10-CM | POA: Diagnosis not present

## 2015-12-30 DIAGNOSIS — E784 Other hyperlipidemia: Secondary | ICD-10-CM | POA: Diagnosis not present

## 2015-12-30 MED FILL — VIT D2 1.25 MG (50,000 UNIT: 1.25 MG | 28 days supply | Qty: 8 | Fill #0

## 2015-12-30 MED FILL — ATORVASTATIN 20 MG TABLET: 20 | 90 days supply | Qty: 90 | Fill #1

## 2015-12-30 MED FILL — XOLAIR 150 MG SOLR: 150 | 28 days supply | Qty: 4 | Fill #2

## 2016-01-02 ENCOUNTER — Other Ambulatory Visit: Payer: Self-pay

## 2016-01-02 DIAGNOSIS — N183 Chronic kidney disease, stage 3 (moderate): Secondary | ICD-10-CM | POA: Diagnosis not present

## 2016-01-02 DIAGNOSIS — E784 Other hyperlipidemia: Secondary | ICD-10-CM | POA: Diagnosis not present

## 2016-01-02 DIAGNOSIS — Z23 Encounter for immunization: Secondary | ICD-10-CM | POA: Diagnosis not present

## 2016-01-02 DIAGNOSIS — J45909 Unspecified asthma, uncomplicated: Secondary | ICD-10-CM | POA: Diagnosis not present

## 2016-01-02 DIAGNOSIS — I10 Essential (primary) hypertension: Secondary | ICD-10-CM | POA: Diagnosis not present

## 2016-01-02 DIAGNOSIS — E668 Other obesity: Secondary | ICD-10-CM | POA: Diagnosis not present

## 2016-01-02 DIAGNOSIS — E559 Vitamin D deficiency, unspecified: Secondary | ICD-10-CM | POA: Diagnosis not present

## 2016-01-02 DIAGNOSIS — R918 Other nonspecific abnormal finding of lung field: Secondary | ICD-10-CM | POA: Diagnosis not present

## 2016-01-02 DIAGNOSIS — E119 Type 2 diabetes mellitus without complications: Secondary | ICD-10-CM | POA: Diagnosis not present

## 2016-01-02 DIAGNOSIS — R1319 Other dysphagia: Secondary | ICD-10-CM | POA: Diagnosis not present

## 2016-01-02 DIAGNOSIS — Z1389 Encounter for screening for other disorder: Secondary | ICD-10-CM | POA: Diagnosis not present

## 2016-01-02 MED ORDER — MONTELUKAST SODIUM 10 MG PO TABS: 10.0000 mg | ORAL_TABLET | Freq: Every day | ORAL | 3 refills | Status: DC

## 2016-01-02 MED FILL — MONTELUKAST SOD 10 MG TAB: 10 | 90 days supply | Qty: 90 | Fill #0

## 2016-01-03 ENCOUNTER — Telehealth: Payer: Self-pay | Admitting: Pulmonary Disease

## 2016-01-03 ENCOUNTER — Ambulatory Visit (INDEPENDENT_AMBULATORY_CARE_PROVIDER_SITE_OTHER): Payer: 59

## 2016-01-03 DIAGNOSIS — J4520 Mild intermittent asthma, uncomplicated: Secondary | ICD-10-CM

## 2016-01-03 NOTE — Telephone Encounter (Addendum)
#   Vials:4 Arrival Date:01/03/16 Lot [id number] Exp Date:5/21 Pt. Brings xolair in.

## 2016-01-06 MED ORDER — OMALIZUMAB 150 MG ~~LOC~~ SOLR
300.0000 mg | SUBCUTANEOUS | Status: DC
  Administered 2016-01-06: 300 mg via SUBCUTANEOUS

## 2016-01-09 ENCOUNTER — Other Ambulatory Visit (HOSPITAL_COMMUNITY): Payer: Self-pay | Admitting: *Deleted

## 2016-01-10 ENCOUNTER — Encounter (HOSPITAL_COMMUNITY)
Admission: RE | Admit: 2016-01-10 | Discharge: 2016-01-10 | Disposition: A | Discharge status: Home / Self Care | Payer: 59 | Source: Ambulatory Visit | Attending: Internal Medicine | Admitting: Internal Medicine

## 2016-01-10 DIAGNOSIS — M810 Age-related osteoporosis without current pathological fracture: Secondary | ICD-10-CM | POA: Diagnosis not present

## 2016-01-10 MED ORDER — DENOSUMAB 60 MG/ML ~~LOC~~ SOLN: 60.0000 mg | Freq: Once | SUBCUTANEOUS | 0 refills | Status: AC

## 2016-01-10 MED ORDER — DENOSUMAB 60 MG/ML ~~LOC~~ SOLN
60.0000 mg | Freq: Once | SUBCUTANEOUS | Status: AC
  Administered 2016-01-10: 60 mg via SUBCUTANEOUS
  Filled 2016-01-10: qty 1

## 2016-01-20 DIAGNOSIS — Z1212 Encounter for screening for malignant neoplasm of rectum: Secondary | ICD-10-CM | POA: Diagnosis not present

## 2016-02-04 ENCOUNTER — Other Ambulatory Visit: Payer: Self-pay | Admitting: Pulmonary Disease

## 2016-02-04 MED FILL — IRBESARTAN 300 MG TABLET: 300 | 90 days supply | Qty: 90 | Fill #3

## 2016-02-04 MED FILL — XOLAIR 150 MG SOLR: 150 | 28 days supply | Qty: 4 | Fill #3

## 2016-02-04 MED FILL — QVAR 80 MCG ORAL INHALER: 80 | 30 days supply | Qty: 9 | Fill #4

## 2016-02-04 MED FILL — VENTOLIN HFA 90 MCG INHALER: 108 (90 BAS | 25 days supply | Qty: 18 | Fill #0

## 2016-02-06 ENCOUNTER — Ambulatory Visit (INDEPENDENT_AMBULATORY_CARE_PROVIDER_SITE_OTHER): Payer: 59

## 2016-02-06 ENCOUNTER — Telehealth: Payer: Self-pay | Admitting: Pulmonary Disease

## 2016-02-06 ENCOUNTER — Other Ambulatory Visit: Payer: Self-pay | Admitting: Pulmonary Disease

## 2016-02-06 DIAGNOSIS — J4540 Moderate persistent asthma, uncomplicated: Secondary | ICD-10-CM

## 2016-02-06 NOTE — Telephone Encounter (Signed)
#   Vials:4 Arrival Date:02/06/16 Lot [id number] Exp Date:6/21 Pt. Brings xolair in.

## 2016-02-07 MED ORDER — OMALIZUMAB 150 MG ~~LOC~~ SOLR
300.0000 mg | SUBCUTANEOUS | Status: DC
  Administered 2016-02-06: 300 mg via SUBCUTANEOUS

## 2016-02-07 MED FILL — DULERA 200 MCG/5 MCG INH: 200-5 | 30 days supply | Qty: 13 | Fill #0

## 2016-02-10 MED FILL — VIT D2 1.25 MG (50,000 UNIT: 1.25 MG | 28 days supply | Qty: 8 | Fill #1

## 2016-02-10 MED FILL — METFORMIN HCL ER 500 MG TAB: 500 | 90 days supply | Qty: 90 | Fill #0

## 2016-03-03 ENCOUNTER — Ambulatory Visit (INDEPENDENT_AMBULATORY_CARE_PROVIDER_SITE_OTHER): Payer: 59

## 2016-03-03 DIAGNOSIS — J4520 Mild intermittent asthma, uncomplicated: Secondary | ICD-10-CM

## 2016-03-04 MED ORDER — OMALIZUMAB 150 MG ~~LOC~~ SOLR
300.0000 mg | SUBCUTANEOUS | Status: DC
  Administered 2016-03-03: 300 mg via SUBCUTANEOUS

## 2016-03-04 NOTE — Progress Notes (Signed)
Documentation and charges have been completed by Amaya Blakeman, CMA based on the Xolair documentation sheet completed by Tammy Scott.  

## 2016-03-05 ENCOUNTER — Telehealth: Payer: Self-pay | Admitting: Pulmonary Disease

## 2016-03-05 NOTE — Telephone Encounter (Addendum)
#   Vials:4 Arrival Date:03/05/16 Lot [id number] Exp Date:6/21 Pt. Brings medicine in.

## 2016-03-11 ENCOUNTER — Encounter: Payer: Self-pay | Admitting: Pharmacist

## 2016-03-11 NOTE — Progress Notes (Signed)
S: Completing a chart review for specialty medication.  Patient is currently taking Xolair for severe asthma and allergies. Patient is managed by Dr. Lake Bells for this.   Adherence: has had regular injections at pulmonology per chart review  Dosing:   Asthma: SubQ: Dose and frequency based on body weight and pretreatment total IgE serum levels. Dosing should be adjusted during therapy for significant changes in body weight. Dosing should not be adjusted based on total IgE levels taken during treatment or <1 year following interruption of therapy. If therapy has been interrupted for ?1 year, total IgE levels may be re-evaluated for dosage determination.  Pretreatment serum IgE >300 to 400 units/mL (her IgE = 399.1 on 07/26/13):  >70 to 90 kg: 300 mg every 2 weeks   Drug-drug interactions: none  Monitoring: No adverse effects reported  O:     Lab Results  Component Value Date   WBC 9.5 05/10/2012   HGB 13.5 05/10/2012   HCT 38.6 05/10/2012   MCV 88.1 05/10/2012   PLT 300 05/10/2012      Chemistry      Component Value Date/Time   NA 143 02/27/2008 1205   K 3.9 02/27/2008 1205   CL 107 02/27/2008 1205   CO2 31 02/27/2008 1205   BUN 11 02/27/2008 1205   CREATININE 0.7 02/27/2008 1205      Component Value Date/Time   CALCIUM 9.6 02/27/2008 1205   ALKPHOS 83 02/27/2008 1205   AST 21 02/27/2008 1205   ALT 23 02/27/2008 1205   BILITOT 1.4 (H) 02/27/2008 1205       A/P: 1. Medication review: Patient is tolerating Xolair for severe asthma with no adverse effects and has kept regular administrations with the pulmonology clinic. No recommendations for any changes at this time - needs follow up with Dr. Lake Bells soon.    Christella Hartigan, PharmD, BCPS, BCACP, [place] [facility] [phone]

## 2016-03-13 DIAGNOSIS — M71571 Other bursitis, not elsewhere classified, right ankle and foot: Secondary | ICD-10-CM | POA: Diagnosis not present

## 2016-03-13 DIAGNOSIS — M7731 Calcaneal spur, right foot: Secondary | ICD-10-CM | POA: Diagnosis not present

## 2016-03-13 DIAGNOSIS — M722 Plantar fascial fibromatosis: Secondary | ICD-10-CM | POA: Diagnosis not present

## 2016-03-19 ENCOUNTER — Ambulatory Visit (INDEPENDENT_AMBULATORY_CARE_PROVIDER_SITE_OTHER): Payer: 59

## 2016-03-19 DIAGNOSIS — J4540 Moderate persistent asthma, uncomplicated: Secondary | ICD-10-CM | POA: Diagnosis not present

## 2016-03-23 DIAGNOSIS — M722 Plantar fascial fibromatosis: Secondary | ICD-10-CM | POA: Diagnosis not present

## 2016-03-23 DIAGNOSIS — M71571 Other bursitis, not elsewhere classified, right ankle and foot: Secondary | ICD-10-CM | POA: Diagnosis not present

## 2016-03-23 MED ORDER — OMALIZUMAB 150 MG ~~LOC~~ SOLR
300.0000 mg | SUBCUTANEOUS | Status: DC
  Administered 2016-03-19: 300 mg via SUBCUTANEOUS

## 2016-04-02 ENCOUNTER — Ambulatory Visit (INDEPENDENT_AMBULATORY_CARE_PROVIDER_SITE_OTHER)
Admission: RE | Admit: 2016-04-02 | Discharge: 2016-04-02 | Disposition: A | Discharge status: Home / Self Care | Payer: 59 | Source: Ambulatory Visit | Attending: Pulmonary Disease | Admitting: Pulmonary Disease

## 2016-04-02 DIAGNOSIS — R911 Solitary pulmonary nodule: Secondary | ICD-10-CM

## 2016-04-02 DIAGNOSIS — R918 Other nonspecific abnormal finding of lung field: Secondary | ICD-10-CM | POA: Diagnosis not present

## 2016-04-06 DIAGNOSIS — M71571 Other bursitis, not elsewhere classified, right ankle and foot: Secondary | ICD-10-CM | POA: Diagnosis not present

## 2016-04-06 DIAGNOSIS — M722 Plantar fascial fibromatosis: Secondary | ICD-10-CM | POA: Diagnosis not present

## 2016-04-06 MED FILL — VIT D2 1.25 MG (50,000 UNIT: 1.25 MG | 28 days supply | Qty: 8 | Fill #2

## 2016-04-06 MED FILL — XOLAIR 150 MG SOLR: 150 | 28 days supply | Qty: 4 | Fill #4

## 2016-04-06 MED FILL — HYDROCHLOROTHIAZIDE 25 MG T: 25 | 90 days supply | Qty: 90 | Fill #1

## 2016-04-10 ENCOUNTER — Ambulatory Visit (INDEPENDENT_AMBULATORY_CARE_PROVIDER_SITE_OTHER): Payer: 59

## 2016-04-10 ENCOUNTER — Telehealth: Payer: Self-pay | Admitting: Pulmonary Disease

## 2016-04-10 DIAGNOSIS — J4540 Moderate persistent asthma, uncomplicated: Secondary | ICD-10-CM | POA: Diagnosis not present

## 2016-04-13 ENCOUNTER — Telehealth: Payer: Self-pay | Admitting: Pulmonary Disease

## 2016-04-13 MED ORDER — OMALIZUMAB 150 MG ~~LOC~~ SOLR
300.0000 mg | Freq: Once | SUBCUTANEOUS | Status: AC
  Administered 2016-04-10: 300 mg via SUBCUTANEOUS

## 2016-04-13 NOTE — Telephone Encounter (Signed)
Pt. Brings xolair in.  # Vials:4 Arrival Date:04/10/16  Forgot to put into epic. I got side tracked and forgot to document. Lot #:[id number] Exp Date:9/21

## 2016-04-13 NOTE — Telephone Encounter (Signed)
Created in error

## 2016-04-15 ENCOUNTER — Telehealth: Payer: Self-pay | Admitting: Pulmonary Disease

## 2016-04-16 NOTE — Telephone Encounter (Signed)
Called pt. Today to let her know I didn't receive her fax yesterday. I lm with voice mail at work, and gave her our fax#. I also ask her to call me if she had any questions. Waiting for fax.

## 2016-04-17 NOTE — Telephone Encounter (Signed)
I finally received pt.'s fax, I will fill it out and fax it to [place]. Nothing further needed.

## 2016-04-22 ENCOUNTER — Telehealth: Payer: Self-pay | Admitting: Pulmonary Disease

## 2016-04-22 NOTE — Telephone Encounter (Signed)
Called [employer] to get pt.'s P/A ref.# [id number], Dates: 04/21/16-04/20/17. I called [facility] To give the info. to them, they already had it.. I called Mrs. Fifer to let her know her Arvid Right had been approved till next March. Nothing further needed.

## 2016-04-24 ENCOUNTER — Ambulatory Visit (INDEPENDENT_AMBULATORY_CARE_PROVIDER_SITE_OTHER): Payer: 59

## 2016-04-24 DIAGNOSIS — J4540 Moderate persistent asthma, uncomplicated: Secondary | ICD-10-CM

## 2016-04-27 MED FILL — XOLAIR 150 MG SOLR: 150 | 28 days supply | Qty: 4 | Fill #5

## 2016-04-27 MED FILL — MONTELUKAST SOD 10 MG TAB: 10 | 90 days supply | Qty: 90 | Fill #1

## 2016-04-27 MED FILL — ATORVASTATIN 20 MG TABLET: 20 | 90 days supply | Qty: 90 | Fill #2

## 2016-04-30 MED ORDER — OMALIZUMAB 150 MG ~~LOC~~ SOLR
150.0000 mg | Freq: Once | SUBCUTANEOUS | Status: AC
  Administered 2016-04-24: 150 mg via SUBCUTANEOUS

## 2016-05-13 ENCOUNTER — Ambulatory Visit (INDEPENDENT_AMBULATORY_CARE_PROVIDER_SITE_OTHER): Payer: 59

## 2016-05-13 DIAGNOSIS — J4540 Moderate persistent asthma, uncomplicated: Secondary | ICD-10-CM | POA: Diagnosis not present

## 2016-05-14 MED ORDER — OMALIZUMAB 150 MG ~~LOC~~ SOLR
300.0000 mg | SUBCUTANEOUS | Status: DC
  Administered 2016-05-13: 300 mg via SUBCUTANEOUS

## 2016-05-14 NOTE — Progress Notes (Signed)
Xolair injection documentation and charges entered by Maryagnes Carrasco, RMA, based on injection sheet filled out by Tammy Scott per office protocol.   

## 2016-06-01 MED FILL — VIT D2 1.25 MG (50,000 UNIT: 1.25 MG | 28 days supply | Qty: 8 | Fill #3

## 2016-06-01 MED FILL — METFORMIN HCL ER 500 MG TAB: 500 | 90 days supply | Qty: 90 | Fill #1

## 2016-06-02 MED FILL — IRBESARTAN 300 MG TABLET: 300 | 90 days supply | Qty: 90 | Fill #0

## 2016-06-04 ENCOUNTER — Telehealth: Payer: Self-pay | Admitting: Pulmonary Disease

## 2016-06-04 ENCOUNTER — Ambulatory Visit (INDEPENDENT_AMBULATORY_CARE_PROVIDER_SITE_OTHER): Payer: 59

## 2016-06-04 DIAGNOSIS — J4540 Moderate persistent asthma, uncomplicated: Secondary | ICD-10-CM

## 2016-06-04 NOTE — Telephone Encounter (Signed)
#   Vials:4 Arrival Date:06/04/16 Lot #[id number] Exp Date:9/21

## 2016-06-04 NOTE — Telephone Encounter (Signed)
#   vials:4 Ordered date: Pt. Brings in med.. (Not sure when it was ordered.) Shipping Date: Pt. Brought in 06/04/16

## 2016-06-05 ENCOUNTER — Ambulatory Visit: Payer: Self-pay

## 2016-06-05 MED ORDER — OMALIZUMAB 150 MG ~~LOC~~ SOLR
300.0000 mg | SUBCUTANEOUS | Status: DC
  Administered 2016-06-04: 300 mg via SUBCUTANEOUS

## 2016-06-05 NOTE — Progress Notes (Signed)
Xolair injection documentation and charges entered by Alyxis Grippi, RMA, based on injection sheet filled out by Tammy Scott per office protocol.   

## 2016-06-26 ENCOUNTER — Ambulatory Visit (INDEPENDENT_AMBULATORY_CARE_PROVIDER_SITE_OTHER): Payer: 59

## 2016-06-26 DIAGNOSIS — J4540 Moderate persistent asthma, uncomplicated: Secondary | ICD-10-CM | POA: Diagnosis not present

## 2016-07-01 MED ORDER — OMALIZUMAB 150 MG ~~LOC~~ SOLR
300.0000 mg | SUBCUTANEOUS | Status: DC
  Administered 2016-06-26: 300 mg via SUBCUTANEOUS

## 2016-07-01 NOTE — Progress Notes (Signed)
Xolair injection documentation and charges entered by Ashley Caulfield, RMA, based on injection sheet filled out by Tammy Scott per office protocol.   

## 2016-07-06 DIAGNOSIS — Z6832 Body mass index (BMI) 32.0-32.9, adult: Secondary | ICD-10-CM | POA: Diagnosis not present

## 2016-07-06 DIAGNOSIS — J45909 Unspecified asthma, uncomplicated: Secondary | ICD-10-CM | POA: Diagnosis not present

## 2016-07-06 DIAGNOSIS — R918 Other nonspecific abnormal finding of lung field: Secondary | ICD-10-CM | POA: Diagnosis not present

## 2016-07-06 DIAGNOSIS — E668 Other obesity: Secondary | ICD-10-CM | POA: Diagnosis not present

## 2016-07-06 DIAGNOSIS — N183 Chronic kidney disease, stage 3 (moderate): Secondary | ICD-10-CM | POA: Diagnosis not present

## 2016-07-06 DIAGNOSIS — E119 Type 2 diabetes mellitus without complications: Secondary | ICD-10-CM | POA: Diagnosis not present

## 2016-07-06 MED FILL — CEFDINIR 300 MG CAPSULE: 300 | 10 days supply | Qty: 20 | Fill #0

## 2016-07-13 MED FILL — VIT D2 1.25 MG (50,000 UNIT: 1.25 MG | 28 days supply | Qty: 8 | Fill #4

## 2016-07-13 MED FILL — DULERA 200 MCG/5 MCG INH: 200-5 | 30 days supply | Qty: 13 | Fill #1

## 2016-07-13 MED FILL — VENTOLIN HFA 90 MCG INHALER: 108 (90 BAS | 25 days supply | Qty: 18 | Fill #1

## 2016-07-14 ENCOUNTER — Other Ambulatory Visit: Payer: Self-pay

## 2016-07-14 MED ORDER — BECLOMETHASONE DIPROP HFA 80 MCG/ACT IN AERB: 2.0000 | INHALATION_SPRAY | Freq: Two times a day (BID) | RESPIRATORY_TRACT | 11 refills | Status: DC

## 2016-07-14 MED ORDER — BECLOMETHASONE DIPROPIONATE 80 MCG/ACT IN AERS: INHALATION_SPRAY | RESPIRATORY_TRACT | 5 refills | Status: DC

## 2016-07-14 MED FILL — QVAR REDIHALER 80 MCG/ACT A: 80 | 30 days supply | Qty: 11 | Fill #0

## 2016-07-16 ENCOUNTER — Telehealth: Payer: Self-pay | Admitting: Pulmonary Disease

## 2016-07-16 ENCOUNTER — Ambulatory Visit (INDEPENDENT_AMBULATORY_CARE_PROVIDER_SITE_OTHER): Payer: 59 | Admitting: Adult Health

## 2016-07-16 ENCOUNTER — Encounter: Payer: Self-pay | Admitting: Adult Health

## 2016-07-16 DIAGNOSIS — R911 Solitary pulmonary nodule: Secondary | ICD-10-CM | POA: Diagnosis not present

## 2016-07-16 DIAGNOSIS — J4540 Moderate persistent asthma, uncomplicated: Secondary | ICD-10-CM | POA: Diagnosis not present

## 2016-07-16 DIAGNOSIS — J45909 Unspecified asthma, uncomplicated: Secondary | ICD-10-CM | POA: Insufficient documentation

## 2016-07-16 NOTE — Progress Notes (Signed)
@Patient  ID: Elaine Luna, female    DOB: [birth year], 71 y.o.   MRN: [id number]  Chief Complaint  Patient presents with  . Follow-up    asthma     Referring provider: Shon Baton, MD  HPI: 71 year old female former smoker followed for moderate persistent  asthma on Xolair aspergillus IgE antibody moderately +2017  TEST  04/2016 High-resolution CT chest with mild cylindrical bronchiectasis and scattered sub-centimeter pulmonary nodules no bigger than 4 mm  07/16/2016 Follow up : Asthma  Patient returns for annual follow-up for asthma. Patient was last seen in March 2017. Patient says overall that her breathing has been doing well. She remains on Dulera twice daily. She is also on Qvar twice daily. She is on Xolair injections. Patient says feels that her breathing is doing very well. She is feeling good. 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 She denies any flare cough or wheezing. Had URI recently , seen by PCP given abx. Sx resolved.  PVX and PRevnar utd.       No Known Allergies  Immunization History  Administered Date(s) Administered  . Influenza Split 11/02/2012, 11/05/2014, 10/04/2015  . Influenza Whole 10/13/2007, 11/03/2010, 11/06/2011  . Influenza-Unspecified 11/02/2013  . Pneumococcal Conjugate-13 11/02/2012  . Pneumococcal Polysaccharide-23 02/03/2008    Past Medical History:  Diagnosis Date  . Asthma   . Colon polyps   . DM type 2 (diabetes mellitus, type 2) ([place]) 2015   . Hyperlipidemia   . Hypertension   . Obesity   . 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 Tubular adenoma     Tobacco History: History  Smoking Status  . Former Smoker  . Packs/day: 0.30  . Years: 10.00  . Types: Cigarettes  . Quit date: 02/03/1975  Smokeless Tobacco  . Never Used   Counseling given: Not Answered   Outpatient Encounter Prescriptions as of 07/16/2016  Medication Sig  . albuterol (PROVENTIL) (2.5 MG/3ML) 0.083% nebulizer solution Take 3 mLs (2.5 mg total) by  nebulization every 6 (six) hours as needed for wheezing.  Marland Kitchen atorvastatin (LIPITOR) 20 MG tablet Take 20 mg by mouth daily.  . beclomethasone (QVAR REDIHALER) 80 MCG/ACT inhaler Inhale 2 puffs into the lungs 2 (two) times daily.  . Denosumab (PROLIA [place]) Inject into the skin every 6 (six) months.  . DULERA 200-5 MCG/ACT AERO INHALE 2 PUFFS BY MOUTH 2 TIMES DAILY.  . hydrochlorothiazide (MICROZIDE) 12.5 MG capsule Take 12.5 mg by mouth daily.  . irbesartan (AVAPRO) 300 MG tablet Take 300 mg by mouth daily.   . metFORMIN (GLUCOPHAGE-XR) 500 MG 24 hr tablet Take 1 tablet by mouth daily.  . montelukast (SINGULAIR) 10 MG tablet Take 1 tablet (10 mg total) by mouth daily.  . VENTOLIN HFA 108 (90 Base) MCG/ACT inhaler INHALE 1-2 PUFFS BY MOUTH FOUR TIMES DAILY AS NEEDED  . Vitamin D, Ergocalciferol, (DRISDOL) 50000 UNITS CAPS Take 50,000 Units by mouth. Twice weekly  . predniSONE (DELTASONE) 10 MG tablet 30mg  daily for one month, then 20mg  daily for one month, then 10mg  daily for one month (Patient not taking: Reported on 07/16/2016)   Facility-Administered Encounter Medications as of 07/16/2016  Medication  . omalizumab Arvid Right) injection 300 mg  . omalizumab Arvid Right) injection 300 mg  . omalizumab Arvid Right) injection 300 mg     Review of Systems  Constitutional:   No  weight loss, night sweats,  Fevers, chills, + fatigue, or  lassitude.  HEENT:   No headaches,  Difficulty swallowing,  Tooth/dental problems, or  Sore throat,                No sneezing, itching, ear ache, nasal congestion, post nasal drip,   CV:  No chest pain,  Orthopnea, PND, swelling in lower extremities, anasarca, dizziness, palpitations, syncope.   GI  No heartburn, indigestion, abdominal pain, nausea, vomiting, diarrhea, change in bowel habits, loss of appetite, bloody stools.   Resp: No shortness of breath with exertion or at rest.  No excess mucus, no productive cough,  No non-productive cough,  No coughing up of blood.   No change in color of mucus.  No wheezing.  No chest wall deformity  Skin: no rash or lesions.  GU: no dysuria, change in color of urine, no urgency or frequency.  No flank pain, no hematuria   MS:  No joint pain or swelling.  No decreased range of motion.  No back pain.    Physical Exam  BP (!) 150/72 (BP Location: Left Arm, Cuff Size: Normal)   Pulse 62   Ht 5\' 4"  (1.626 m)   Wt 180 lb (81.6 kg)   SpO2 98%   BMI 30.90 kg/m   GEN: A/Ox3; pleasant , NAD    HEENT:  [place]/AT,  EACs-clear, TMs-wnl, NOSE-clear, THROAT-clear, no lesions, no postnasal drip or exudate noted.   NECK:  Supple w/ fair ROM; no JVD; normal carotid impulses w/o bruits; no thyromegaly or nodules palpated; no lymphadenopathy.    RESP  Clear  P & A; w/o, wheezes/ rales/ or rhonchi. no accessory muscle use, no dullness to percussion  CARD:  RRR, no m/r/g, no peripheral edema, pulses intact, no cyanosis or clubbing.  GI:   Soft & nt; nml bowel sounds; no organomegaly or masses detected.   Musco: Warm bil, no deformities or joint swelling noted.   Neuro: alert, no focal deficits noted.    Skin: Warm, no lesions or rashes    Lab Results:  CBC    Component Value Date/Time   WBC 9.5 05/10/2012 1358   RBC 4.38 05/10/2012 1358   HGB 13.5 05/10/2012 1358   HCT 38.6 05/10/2012 1358   PLT 300 05/10/2012 1358   MCV 88.1 05/10/2012 1358   MCH 30.8 05/10/2012 1358   MCHC 35.0 05/10/2012 1358   RDW 13.1 05/10/2012 1358    BMET    Component Value Date/Time   NA 143 02/27/2008 1205   K 3.9 02/27/2008 1205   CL 107 02/27/2008 1205   CO2 31 02/27/2008 1205   GLUCOSE 135 (H) 02/27/2008 1205   BUN 11 02/27/2008 1205   CREATININE 0.7 02/27/2008 1205   CALCIUM 9.6 02/27/2008 1205   GFRNONAA 90 02/27/2008 1205   GFRAA 108 02/27/2008 1205    BNP No results found for: BNP  ProBNP    Component Value Date/Time   PROBNP 30.0 02/27/2008 1205    Imaging: No results found.   Assessment & Plan:    Asthma Well controlled on current regimen   Plan  . Patient Instructions  Keep taking your Dulera and QVar as you are doing, but I'm okay with you decreasing the Qvar to 1 puff twice a day if your symptoms have been stable for a month Continue on Xolair as directed.  follow up Dr. Lake Bells in 6 months and As needed        Pulmonary nodule Recent Ct chest 04/2016 showed stable nodules      Rexene Edison, NP 07/16/2016

## 2016-07-16 NOTE — Telephone Encounter (Signed)
Spoke with pt who called in concerned that the pharmacy will not fill of Xolair until she has an OV. Pt states she has been missing shots because of this. She states she was unaware that she needed an ov. Spoke with Janett Billow who gave the ok for pt to see TP today. Pt agreed to the appt. The appt was made and she had no further questions at this time. Nothing further is needed.

## 2016-07-16 NOTE — Patient Instructions (Addendum)
Keep taking your Dulera and QVar as you are doing, but I'm okay with you decreasing the Qvar to 1 puff twice a day if your symptoms have been stable for a month Continue on Xolair as directed.  follow up Dr. Lake Bells in 6 months and As needed

## 2016-07-16 NOTE — Assessment & Plan Note (Signed)
Recent Ct chest 04/2016 showed stable nodules

## 2016-07-16 NOTE — Assessment & Plan Note (Signed)
Well controlled on current regimen   Plan  . Patient Instructions  Keep taking your Dulera and QVar as you are doing, but I'm okay with you decreasing the Qvar to 1 puff twice a day if your symptoms have been stable for a month Continue on Xolair as directed.  follow up Dr. Lake Bells in 6 months and As needed

## 2016-07-17 NOTE — Progress Notes (Signed)
Reviewed, agree 

## 2016-07-23 ENCOUNTER — Telehealth: Payer: Self-pay | Admitting: Pulmonary Disease

## 2016-07-23 NOTE — Telephone Encounter (Signed)
Called [facility]. And ordered xolair  6 refills. Called pt. To make her aware. Nothing further needed. Shelby when u or one of the other scheduler's get a chance Please change Elaine Luna work # to [phone].  Thanks!

## 2016-07-23 NOTE — Telephone Encounter (Signed)
Routing to [employer] to order patient's Xolair and call her

## 2016-07-23 NOTE — Telephone Encounter (Signed)
Elaine Luna, Elaine Luna work number has been changed.  Thank you !

## 2016-07-24 ENCOUNTER — Other Ambulatory Visit: Payer: Self-pay | Admitting: Pharmacist

## 2016-07-24 MED ORDER — OMALIZUMAB 150 MG ~~LOC~~ SOLR: 300.0000 mg | SUBCUTANEOUS | 5 refills | Status: DC

## 2016-07-24 MED FILL — XOLAIR 150 MG SOLR: 150 | 28 days supply | Qty: 4 | Fill #0

## 2016-07-29 ENCOUNTER — Telehealth: Payer: Self-pay | Admitting: Pulmonary Disease

## 2016-07-29 MED ORDER — PREDNISONE 20 MG PO TABS: 20.0000 mg | ORAL_TABLET | Freq: Every day | ORAL | 0 refills | Status: DC

## 2016-07-29 MED FILL — predniSONE 20 MG TABS: 20 | 5 days supply | Qty: 5 | Fill #0

## 2016-07-29 NOTE — Telephone Encounter (Signed)
Hold xolair Prednisone 20mg  daily x 5 days Call if no improvement

## 2016-07-29 NOTE — Telephone Encounter (Signed)
Pt c/o increased sob, PND, chest tightness, prod cough with yellow mucus X1 day.  Denies fever, chest pain.   Pt has taken maintenance meds to help with symptoms, requesting further recs.   Also- pt is due this week for xolair injection, wants to know if this should be pushed back X1 week d/t her feeling ill.    Pt uses [facility].    BQ please advise on recs.  Thanks.

## 2016-07-29 NOTE — Telephone Encounter (Signed)
Spoke with pt and made her aware of BQ's message. Pt understood and agreed to the medication being called in. Her rx was sent to her pharmacy of choice. Nothing further is needed

## 2016-08-11 ENCOUNTER — Telehealth: Payer: Self-pay | Admitting: Pulmonary Disease

## 2016-08-11 ENCOUNTER — Ambulatory Visit (INDEPENDENT_AMBULATORY_CARE_PROVIDER_SITE_OTHER): Payer: 59

## 2016-08-11 DIAGNOSIS — J4520 Mild intermittent asthma, uncomplicated: Secondary | ICD-10-CM

## 2016-08-11 NOTE — Telephone Encounter (Signed)
#   Vials:4 Arrival Date:08/11/16 Lot #:[id number] Exp Date:11/21 Pt. Brings med. In.

## 2016-08-12 MED ORDER — OMALIZUMAB 150 MG ~~LOC~~ SOLR
300.0000 mg | SUBCUTANEOUS | Status: DC
  Administered 2016-08-12: 300 mg via SUBCUTANEOUS

## 2016-08-12 NOTE — Progress Notes (Signed)
Documentation of medication administration and charges of Xolair have been completed by Lindsay Lemons, CMA based on the Xolair documentation sheet completed by Tammy Scott.  

## 2016-09-03 MED FILL — VIT D2 1.25 MG (50,000 UNIT: 1.25 MG | 28 days supply | Qty: 8 | Fill #5

## 2016-09-03 MED FILL — MONTELUKAST SOD 10 MG TAB: 10 | 90 days supply | Qty: 90 | Fill #2

## 2016-09-04 ENCOUNTER — Ambulatory Visit (INDEPENDENT_AMBULATORY_CARE_PROVIDER_SITE_OTHER): Payer: 59

## 2016-09-04 DIAGNOSIS — J4540 Moderate persistent asthma, uncomplicated: Secondary | ICD-10-CM

## 2016-09-07 MED FILL — XOLAIR 150 MG SOLR: 150 | 28 days supply | Qty: 4 | Fill #1

## 2016-09-07 MED FILL — HYDROCHLOROTHIAZIDE 25 MG T: 25 | 90 days supply | Qty: 90 | Fill #2

## 2016-09-08 MED FILL — ATORVASTATIN 20 MG TABLET: 20 | 90 days supply | Qty: 90 | Fill #0

## 2016-09-11 MED ORDER — OMALIZUMAB 150 MG ~~LOC~~ SOLR
300.0000 mg | Freq: Once | SUBCUTANEOUS | Status: AC
  Administered 2016-09-04: 300 mg via SUBCUTANEOUS

## 2016-09-18 ENCOUNTER — Ambulatory Visit (INDEPENDENT_AMBULATORY_CARE_PROVIDER_SITE_OTHER): Payer: 59

## 2016-09-18 ENCOUNTER — Telehealth: Payer: Self-pay | Admitting: Pulmonary Disease

## 2016-09-18 DIAGNOSIS — J4540 Moderate persistent asthma, uncomplicated: Secondary | ICD-10-CM

## 2016-09-18 NOTE — Telephone Encounter (Signed)
#   Vials:4 Arrival Date:09/18/16 Lot #:[id number] Exp Date:01/2020  Pt. Brings med. In.

## 2016-09-22 MED ORDER — OMALIZUMAB 150 MG ~~LOC~~ SOLR
300.0000 mg | SUBCUTANEOUS | Status: DC
  Administered 2016-09-18: 300 mg via SUBCUTANEOUS

## 2016-09-22 NOTE — Progress Notes (Signed)
Documentation of medication administration and charges of Xolair have been completed by Shanika Levings, CMA based on the Xolair documentation sheet completed by Tammy Scott.  

## 2016-09-23 MED FILL — CLINDAMYCIN HCL 300 MG CAPS: 300 | 3 days supply | Qty: 12 | Fill #0

## 2016-09-23 MED FILL — ACETAMINOPHEN/COD #3 TABLET: 300-30 | 2 days supply | Qty: 12 | Fill #0

## 2016-10-09 ENCOUNTER — Ambulatory Visit (INDEPENDENT_AMBULATORY_CARE_PROVIDER_SITE_OTHER): Payer: 59

## 2016-10-09 DIAGNOSIS — J4540 Moderate persistent asthma, uncomplicated: Secondary | ICD-10-CM | POA: Diagnosis not present

## 2016-10-13 MED FILL — VIT D2 1.25 MG (50,000 UNIT: 1.25 MG | 28 days supply | Qty: 8 | Fill #6

## 2016-10-13 MED FILL — IRBESARTAN 300 MG TABLET: 300 | 90 days supply | Qty: 90 | Fill #1

## 2016-10-13 MED FILL — METFORMIN HCL ER 500 MG TAB: 500 | 30 days supply | Qty: 30 | Fill #0

## 2016-10-19 MED ORDER — OMALIZUMAB 150 MG ~~LOC~~ SOLR
300.0000 mg | Freq: Once | SUBCUTANEOUS | Status: AC
  Administered 2016-10-09: 300 mg via SUBCUTANEOUS

## 2016-10-21 MED FILL — XOLAIR 150 MG SOLR: 150 | 28 days supply | Qty: 4 | Fill #2

## 2016-10-22 ENCOUNTER — Ambulatory Visit (INDEPENDENT_AMBULATORY_CARE_PROVIDER_SITE_OTHER): Payer: 59

## 2016-10-22 DIAGNOSIS — J4550 Severe persistent asthma, uncomplicated: Secondary | ICD-10-CM

## 2016-10-30 MED ORDER — OMALIZUMAB 150 MG ~~LOC~~ SOLR
300.0000 mg | Freq: Once | SUBCUTANEOUS | Status: AC
  Administered 2016-10-22: 300 mg via SUBCUTANEOUS

## 2016-11-09 MED FILL — QVAR REDIHALER 80 MCG/ACT A: 80 | 30 days supply | Qty: 11 | Fill #1

## 2016-11-09 MED FILL — VENTOLIN HFA 90 MCG INHALER: 108 (90 BAS | 25 days supply | Qty: 18 | Fill #2

## 2016-11-13 ENCOUNTER — Ambulatory Visit (INDEPENDENT_AMBULATORY_CARE_PROVIDER_SITE_OTHER): Payer: 59

## 2016-11-13 DIAGNOSIS — J4540 Moderate persistent asthma, uncomplicated: Secondary | ICD-10-CM

## 2016-11-18 MED ORDER — OMALIZUMAB 150 MG ~~LOC~~ SOLR
300.0000 mg | SUBCUTANEOUS | Status: DC
  Administered 2016-11-13: 300 mg via SUBCUTANEOUS

## 2016-11-18 MED FILL — AMOXICILLIN 875 MG TABLET: 875 | 7 days supply | Qty: 14 | Fill #0

## 2016-11-24 MED FILL — XOLAIR 150 MG SOLR: 150 | 28 days supply | Qty: 4 | Fill #3

## 2016-11-24 MED FILL — METFORMIN HCL ER 500 MG TAB: 500 | 30 days supply | Qty: 30 | Fill #1

## 2016-11-24 MED FILL — VIT D2 1.25 MG (50,000 UNIT: 1.25 MG | 28 days supply | Qty: 8 | Fill #7

## 2016-12-03 ENCOUNTER — Ambulatory Visit (INDEPENDENT_AMBULATORY_CARE_PROVIDER_SITE_OTHER): Payer: 59

## 2016-12-03 ENCOUNTER — Telehealth: Payer: Self-pay | Admitting: Pulmonary Disease

## 2016-12-03 DIAGNOSIS — J4540 Moderate persistent asthma, uncomplicated: Secondary | ICD-10-CM | POA: Diagnosis not present

## 2016-12-03 NOTE — Telephone Encounter (Signed)
#   Vials:4 Arrival Date:12/03/16 Lot #:[id number] Exp Date:04/2020 Pt. Brought med. In.

## 2016-12-07 MED ORDER — OMALIZUMAB 150 MG ~~LOC~~ SOLR: 150.0000 mg | Freq: Once | SUBCUTANEOUS | Status: DC

## 2016-12-07 MED ORDER — OMALIZUMAB 150 MG ~~LOC~~ SOLR
300.0000 mg | Freq: Once | SUBCUTANEOUS | Status: AC
  Administered 2016-12-03: 300 mg via SUBCUTANEOUS

## 2016-12-09 MED FILL — AMOXICILLIN 500 MG CAPSULE: 500 | 7 days supply | Qty: 21 | Fill #0

## 2016-12-09 MED FILL — metroNIDAZOLE 500 MG TABS: 500 | 7 days supply | Qty: 21 | Fill #0

## 2016-12-18 ENCOUNTER — Ambulatory Visit (INDEPENDENT_AMBULATORY_CARE_PROVIDER_SITE_OTHER): Payer: 59

## 2016-12-18 DIAGNOSIS — J4551 Severe persistent asthma with (acute) exacerbation: Secondary | ICD-10-CM | POA: Diagnosis not present

## 2016-12-21 MED ORDER — OMALIZUMAB 150 MG ~~LOC~~ SOLR
300.0000 mg | SUBCUTANEOUS | Status: DC
  Administered 2016-12-18: 300 mg via SUBCUTANEOUS

## 2016-12-21 NOTE — Progress Notes (Signed)
Documentation of medication administration and charges of Xolair have been completed by Lindsay Lemons, CMA based on the Xolair documentation sheet completed by Ashley Caulfield, RMA. 

## 2016-12-28 DIAGNOSIS — M810 Age-related osteoporosis without current pathological fracture: Secondary | ICD-10-CM | POA: Diagnosis not present

## 2016-12-28 DIAGNOSIS — E119 Type 2 diabetes mellitus without complications: Secondary | ICD-10-CM | POA: Diagnosis not present

## 2016-12-28 DIAGNOSIS — Z0000 Encounter for general adult medical examination without abnormal findings: Secondary | ICD-10-CM | POA: Diagnosis not present

## 2017-01-04 DIAGNOSIS — N183 Chronic kidney disease, stage 3 (moderate): Secondary | ICD-10-CM | POA: Diagnosis not present

## 2017-01-04 DIAGNOSIS — Z1389 Encounter for screening for other disorder: Secondary | ICD-10-CM | POA: Diagnosis not present

## 2017-01-04 DIAGNOSIS — R918 Other nonspecific abnormal finding of lung field: Secondary | ICD-10-CM | POA: Diagnosis not present

## 2017-01-04 DIAGNOSIS — E559 Vitamin D deficiency, unspecified: Secondary | ICD-10-CM | POA: Diagnosis not present

## 2017-01-04 DIAGNOSIS — E119 Type 2 diabetes mellitus without complications: Secondary | ICD-10-CM | POA: Diagnosis not present

## 2017-01-04 DIAGNOSIS — I700 Atherosclerosis of aorta: Secondary | ICD-10-CM | POA: Diagnosis not present

## 2017-01-04 DIAGNOSIS — E668 Other obesity: Secondary | ICD-10-CM | POA: Diagnosis not present

## 2017-01-04 DIAGNOSIS — E7849 Other hyperlipidemia: Secondary | ICD-10-CM | POA: Diagnosis not present

## 2017-01-04 DIAGNOSIS — Z0000 Encounter for general adult medical examination without abnormal findings: Secondary | ICD-10-CM | POA: Diagnosis not present

## 2017-01-04 DIAGNOSIS — J45998 Other asthma: Secondary | ICD-10-CM | POA: Diagnosis not present

## 2017-01-04 MED FILL — levoFLOXacin 500 MG TABS: 500 | 5 days supply | Qty: 5 | Fill #0

## 2017-01-05 ENCOUNTER — Other Ambulatory Visit: Payer: Self-pay | Admitting: Pulmonary Disease

## 2017-01-05 MED FILL — METFORMIN HCL ER 500 MG TAB: 500 | 30 days supply | Qty: 30 | Fill #2

## 2017-01-05 MED FILL — MONTELUKAST SOD 10 MG TAB: 10 | 90 days supply | Qty: 90 | Fill #0

## 2017-01-12 ENCOUNTER — Other Ambulatory Visit (HOSPITAL_COMMUNITY): Payer: Self-pay | Admitting: *Deleted

## 2017-01-12 MED FILL — DULERA 200 MCG/5 MCG INH: 200-5 | 30 days supply | Qty: 13 | Fill #2

## 2017-01-12 MED FILL — QVAR REDIHALER 80 MCG/ACT A: 80 | 30 days supply | Qty: 11 | Fill #2

## 2017-01-14 ENCOUNTER — Encounter (HOSPITAL_COMMUNITY)
Admission: RE | Admit: 2017-01-14 | Discharge: 2017-01-14 | Disposition: A | Discharge status: Home / Self Care | Payer: 59 | Source: Ambulatory Visit | Attending: Internal Medicine | Admitting: Internal Medicine

## 2017-01-14 DIAGNOSIS — Z1212 Encounter for screening for malignant neoplasm of rectum: Secondary | ICD-10-CM | POA: Diagnosis not present

## 2017-01-14 DIAGNOSIS — M810 Age-related osteoporosis without current pathological fracture: Secondary | ICD-10-CM | POA: Diagnosis not present

## 2017-01-14 MED ORDER — DENOSUMAB 60 MG/ML ~~LOC~~ SOLN
60.0000 mg | Freq: Once | SUBCUTANEOUS | Status: AC
  Administered 2017-01-14: 60 mg via SUBCUTANEOUS
  Filled 2017-01-14: qty 1

## 2017-01-14 MED FILL — XOLAIR 150 MG SOLR: 150 | 28 days supply | Qty: 4 | Fill #4

## 2017-01-15 ENCOUNTER — Ambulatory Visit: Payer: Self-pay | Admitting: Adult Health

## 2017-01-18 ENCOUNTER — Telehealth: Payer: Self-pay | Admitting: Pulmonary Disease

## 2017-01-18 ENCOUNTER — Ambulatory Visit: Payer: 59 | Admitting: Adult Health

## 2017-01-18 ENCOUNTER — Telehealth: Payer: Self-pay | Admitting: Adult Health

## 2017-01-18 ENCOUNTER — Encounter: Payer: Self-pay | Admitting: Adult Health

## 2017-01-18 DIAGNOSIS — J4550 Severe persistent asthma, uncomplicated: Secondary | ICD-10-CM | POA: Diagnosis not present

## 2017-01-18 DIAGNOSIS — J309 Allergic rhinitis, unspecified: Secondary | ICD-10-CM | POA: Insufficient documentation

## 2017-01-18 MED ORDER — VENTOLIN HFA 108 (90 BASE) MCG/ACT IN AERS: INHALATION_SPRAY | RESPIRATORY_TRACT | 1 refills | Status: DC

## 2017-01-18 MED ORDER — ALBUTEROL SULFATE (2.5 MG/3ML) 0.083% IN NEBU: 2.5000 mg | INHALATION_SOLUTION | Freq: Four times a day (QID) | RESPIRATORY_TRACT | 1 refills | Status: DC | PRN

## 2017-01-18 MED ORDER — MONTELUKAST SODIUM 10 MG PO TABS: 10.0000 mg | ORAL_TABLET | Freq: Every day | ORAL | 1 refills | Status: DC

## 2017-01-18 MED ORDER — BECLOMETHASONE DIPROP HFA 80 MCG/ACT IN AERB: 2.0000 | INHALATION_SPRAY | Freq: Two times a day (BID) | RESPIRATORY_TRACT | 1 refills | Status: DC

## 2017-01-18 MED ORDER — MOMETASONE FURO-FORMOTEROL FUM 200-5 MCG/ACT IN AERO: INHALATION_SPRAY | RESPIRATORY_TRACT | 1 refills | Status: DC

## 2017-01-18 MED FILL — ALBUTEROL 0.083% INHAL SOLN: (2.5 MG/3ML | 6 days supply | Qty: 75 | Fill #0

## 2017-01-18 MED FILL — VENTOLIN HFA 90 MCG INHALER: 108 (90 BAS | 75 days supply | Qty: 54 | Fill #0

## 2017-01-18 NOTE — Assessment & Plan Note (Signed)
Cont  On current regimen  Trigger prevention

## 2017-01-18 NOTE — Patient Instructions (Signed)
Keep taking your Dulera and QVar Continue on Xolair .  follow up Dr. Lake Bells in 6 months and As needed

## 2017-01-18 NOTE — Telephone Encounter (Signed)
Spoke with pt. She has an appointment today with TP at 4:30pm. Pt is wanting to know if she can get her Xolair while she is here. Advised her that this shouldn't be a problem. Nothing further was needed.

## 2017-01-18 NOTE — Progress Notes (Signed)
@Patient  ID: Elaine Luna, female    DOB: 1945-10-24, 71 y.o.   MRN: [id number]  Chief Complaint  Patient presents with  . Follow-up    Asthma     Referring provider: Shon Baton, MD  HPI: 71 year old female former smoker followed for moderate persistent  asthma on Xolair aspergillus IgE antibody moderately +2017  TEST  04/2016 High-resolution CT chest with mild cylindrical bronchiectasis and scattered sub-centimeter pulmonary nodules no bigger than 4 mm  01/18/2017 Follow up: Moderate Persistent Asthma  Patient returns for a six-month follow-up.  She says she has been doing very well with her asthma.  She is currently on Dulera twice daily.  She takes Qvar 1 puff twice daily.  She is on Xolair injections.  Patient says that her asthma has been doing exceptionally well.  She is been able to travel in vacation without any difficulties.  She has no shortness of breath.  She denies any increased cough or wheezing.  No increased use of her albuterol.  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      No Known Allergies  Immunization History  Administered Date(s) Administered  . Influenza Split 11/02/2012, 11/05/2014, 10/04/2015  . Influenza Whole 10/13/2007, 11/03/2010, 11/06/2011  . Influenza, High Dose Seasonal PF 11/02/2016  . Influenza-Unspecified 11/02/2013  . Pneumococcal Conjugate-13 11/02/2012  . Pneumococcal Polysaccharide-23 02/03/2008    Past Medical History:  Diagnosis Date  . Asthma   . Colon polyps   . DM type 2 (diabetes mellitus, type 2) ([place]) 2015   . Hyperlipidemia   . Hypertension   . Obesity   . 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 Tubular adenoma     Tobacco History: Social History   Tobacco Use  Smoking Status Former Smoker  . Packs/day: 0.30  . Years: 10.00  . Pack years: 3.00  . Types: Cigarettes  . Last attempt to quit: 02/03/1975  . Years since quitting: 41.9  Smokeless Tobacco Never Used   Counseling given: Not Answered   Outpatient  Encounter Medications as of 01/18/2017  Medication Sig  . albuterol (PROVENTIL) (2.5 MG/3ML) 0.083% nebulizer solution Take 3 mLs (2.5 mg total) by nebulization every 6 (six) hours as needed for wheezing.  Marland Kitchen atorvastatin (LIPITOR) 20 MG tablet Take 20 mg by mouth daily.  . beclomethasone (QVAR REDIHALER) 80 MCG/ACT inhaler Inhale 2 puffs into the lungs 2 (two) times daily.  . Denosumab (PROLIA [place]) Inject into the skin every 6 (six) months.  . DULERA 200-5 MCG/ACT AERO INHALE 2 PUFFS BY MOUTH 2 TIMES DAILY.  . hydrochlorothiazide (MICROZIDE) 12.5 MG capsule Take 12.5 mg by mouth daily.  . irbesartan (AVAPRO) 300 MG tablet Take 300 mg by mouth daily.   . metFORMIN (GLUCOPHAGE-XR) 500 MG 24 hr tablet Take 1 tablet by mouth daily.  . montelukast (SINGULAIR) 10 MG tablet TAKE 1 TABLET (10 MG TOTAL) BY MOUTH DAILY.  Marland Kitchen predniSONE (DELTASONE) 10 MG tablet 30mg  daily for one month, then 20mg  daily for one month, then 10mg  daily for one month (Patient not taking: Reported on 07/16/2016)  . predniSONE (DELTASONE) 20 MG tablet Take 1 tablet (20 mg total) by mouth daily with breakfast. (Patient not taking: Reported on 01/18/2017)  . VENTOLIN HFA 108 (90 Base) MCG/ACT inhaler INHALE 1-2 PUFFS BY MOUTH FOUR TIMES DAILY AS NEEDED  . Vitamin D, Ergocalciferol, (DRISDOL) 50000 UNITS CAPS Take 50,000 Units by mouth. Twice weekly   Facility-Administered Encounter Medications as of 01/18/2017  Medication  .  omalizumab Arvid Right) injection 300 mg     Review of Systems  Constitutional:   No  weight loss, night sweats,  Fevers, chills, fatigue, or  lassitude.  HEENT:   No headaches,  Difficulty swallowing,  Tooth/dental problems, or  Sore throat,                No sneezing, itching, ear ache, nasal congestion, post nasal drip,   CV:  No chest pain,  Orthopnea, PND, swelling in lower extremities, anasarca, dizziness, palpitations, syncope.   GI  No heartburn, indigestion, abdominal pain, nausea, vomiting,  diarrhea, change in bowel habits, loss of appetite, bloody stools.   Resp: No shortness of breath with exertion or at rest.  No excess mucus, no productive cough,  No non-productive cough,  No coughing up of blood.  No change in color of mucus.  No wheezing.  No chest wall deformity  Skin: no rash or lesions.  GU: no dysuria, change in color of urine, no urgency or frequency.  No flank pain, no hematuria   MS:  No joint pain or swelling.  No decreased range of motion.  No back pain.    Physical Exam  BP 130/70 (BP Location: Left Arm, Cuff Size: Normal)   Pulse (!) 58   Ht 5\' 4"  (1.626 m)   Wt 181 lb (82.1 kg)   SpO2 97%   BMI 31.07 kg/m   GEN: A/Ox3; pleasant , NAD, well nourished    HEENT:  [place]/AT,  EACs-clear, TMs-wnl, NOSE-clear, THROAT-clear, no lesions, no postnasal drip or exudate noted.   NECK:  Supple w/ fair ROM; no JVD; normal carotid impulses w/o bruits; no thyromegaly or nodules palpated; no lymphadenopathy.    RESP  Clear  P & A; w/o, wheezes/ rales/ or rhonchi. no accessory muscle use, no dullness to percussion  CARD:  RRR, no m/r/g, no peripheral edema, pulses intact, no cyanosis or clubbing.  GI:   Soft & nt; nml bowel sounds; no organomegaly or masses detected.   Musco: Warm bil, no deformities or joint swelling noted.   Neuro: alert, no focal deficits noted.    Skin: Warm, no lesions or rashes    Lab Results:  CBC   Imaging: No results found.   Assessment & Plan:   Asthma Well controlled on present regimen   Plan  Patient Instructions  Keep taking your Dulera and QVar Continue on Xolair .  follow up Dr. Lake Bells in 6 months and As needed         Allergic rhinitis Cont  On current regimen  Trigger prevention      Rexene Edison, NP 01/18/2017

## 2017-01-18 NOTE — Addendum Note (Signed)
Addended by: Parke Poisson E on: 01/18/2017 05:27 PM   Modules accepted: Orders

## 2017-01-18 NOTE — Assessment & Plan Note (Signed)
Well controlled on present regimen   Plan  Patient Instructions  Keep taking your Dulera and QVar Continue on Xolair .  follow up Dr. Lake Bells in 6 months and As needed

## 2017-01-19 MED ORDER — OMALIZUMAB 150 MG ~~LOC~~ SOLR
300.0000 mg | SUBCUTANEOUS | Status: DC
  Administered 2017-01-18: 300 mg via SUBCUTANEOUS

## 2017-01-19 NOTE — Progress Notes (Signed)
Reviewed, agree 

## 2017-01-19 NOTE — Addendum Note (Signed)
Addended by: Desmond Dike C on: 01/19/2017 09:02 AM   Modules accepted: Orders

## 2017-01-22 ENCOUNTER — Telehealth: Payer: Self-pay | Admitting: Adult Health

## 2017-01-22 MED ORDER — PREDNISONE 10 MG PO TABS: ORAL_TABLET | ORAL | 0 refills | Status: DC

## 2017-01-22 NOTE — Telephone Encounter (Signed)
Received faxed refill request for patient on prednisone  Pt is not on maintainence prednisone  Called spoke with patient to verify She is NOT on maintenance prednisone but would like a pred taper to have on hand in case of flare > requesting 4x3, 3x3, 2x3, 1x3 and stop  Spoke with TP: okay to refill x1 with no refills [place] and spoke with pharmacist Megan > verbal given to change the 20mg  prednisone to the above taper  Med list updated Nothing further needed; will sign off

## 2017-02-01 ENCOUNTER — Encounter (HOSPITAL_COMMUNITY): Payer: Self-pay

## 2017-02-01 MED FILL — VIT D2 1.25 MG (50,000 UNIT: 1.25 MG | 28 days supply | Qty: 8 | Fill #0

## 2017-02-01 MED FILL — ATORVASTATIN 20 MG TABLET: 20 | 90 days supply | Qty: 90 | Fill #1

## 2017-02-03 NOTE — Telephone Encounter (Signed)
Xolair shoulld have been put in 01/18/17 when pt. Brought med in. I was off 12/17 & 01/19/17. I was trying to catch up when I got back. Then I forgot to put xolair in computer.  # Vials:4 Arrival Date:01/18/17 Lot #:[id number] Exp Date:05/2020

## 2017-02-15 DIAGNOSIS — M810 Age-related osteoporosis without current pathological fracture: Secondary | ICD-10-CM | POA: Diagnosis not present

## 2017-02-16 MED FILL — IRBESARTAN 300 MG TABLET: 300 | 90 days supply | Qty: 90 | Fill #2

## 2017-02-16 MED FILL — METFORMIN HCL ER 500 MG TAB: 500 | 30 days supply | Qty: 30 | Fill #3

## 2017-02-18 ENCOUNTER — Ambulatory Visit: Payer: Self-pay

## 2017-02-18 ENCOUNTER — Ambulatory Visit (INDEPENDENT_AMBULATORY_CARE_PROVIDER_SITE_OTHER): Payer: 59

## 2017-02-18 DIAGNOSIS — J4540 Moderate persistent asthma, uncomplicated: Secondary | ICD-10-CM

## 2017-02-24 MED ORDER — OMALIZUMAB 150 MG ~~LOC~~ SOLR
300.0000 mg | SUBCUTANEOUS | Status: DC
  Administered 2017-02-18: 300 mg via SUBCUTANEOUS

## 2017-02-24 NOTE — Progress Notes (Signed)
Documentation of medication administration and charges of Xolair have been completed by Lindsay Lemons, CMA based on the Xolair documentation sheet completed by Tammy Scott.  

## 2017-03-01 MED FILL — VIT D2 1.25 MG (50,000 UNIT: 1.25 MG | 28 days supply | Qty: 8 | Fill #1

## 2017-03-01 MED FILL — XOLAIR 150 MG SOLR: 150 | 28 days supply | Qty: 4 | Fill #5

## 2017-03-02 MED FILL — DULERA 200 MCG/5 MCG INH: 200-5 | 30 days supply | Qty: 13 | Fill #0

## 2017-03-04 ENCOUNTER — Ambulatory Visit (INDEPENDENT_AMBULATORY_CARE_PROVIDER_SITE_OTHER): Payer: 59

## 2017-03-04 ENCOUNTER — Telehealth: Payer: Self-pay | Admitting: Pulmonary Disease

## 2017-03-04 DIAGNOSIS — J4551 Severe persistent asthma with (acute) exacerbation: Secondary | ICD-10-CM

## 2017-03-04 NOTE — Telephone Encounter (Signed)
#   Vials:4 Arrival Date:03/04/17 Lot #:[id number] Exp Date:05/2020 Pt bring med in.

## 2017-03-05 MED ORDER — OMALIZUMAB 150 MG ~~LOC~~ SOLR
300.0000 mg | SUBCUTANEOUS | Status: DC
  Administered 2017-03-04: 300 mg via SUBCUTANEOUS

## 2017-03-05 NOTE — Progress Notes (Signed)
Documentation of medication administration and charges of Xolair have been completed by Lindsay Lemons, CMA based on the Xolair documentation sheet completed by Tammy Scott.  

## 2017-03-18 ENCOUNTER — Other Ambulatory Visit: Payer: Self-pay | Admitting: Internal Medicine

## 2017-03-18 ENCOUNTER — Telehealth: Payer: Self-pay | Admitting: Pulmonary Disease

## 2017-03-18 MED ORDER — OMALIZUMAB 150 MG ~~LOC~~ SOLR: 300.0000 mg | SUBCUTANEOUS | 5 refills | Status: DC

## 2017-03-18 MED FILL — HYDROCHLOROTHIAZIDE 25 MG T: 25 | 90 days supply | Qty: 90 | Fill #0

## 2017-03-18 MED FILL — METFORMIN HCL ER 500 MG TAB: 500 | 30 days supply | Qty: 30 | Fill #4

## 2017-03-18 NOTE — Telephone Encounter (Signed)
Routing this to [employer] for her to follow up on.

## 2017-03-19 ENCOUNTER — Telehealth: Payer: Self-pay | Admitting: Pharmacist

## 2017-03-19 ENCOUNTER — Other Ambulatory Visit: Payer: Self-pay | Admitting: Pharmacist

## 2017-03-19 MED ORDER — OMALIZUMAB 150 MG ~~LOC~~ SOLR: 300.0000 mg | SUBCUTANEOUS | 5 refills | Status: DC

## 2017-03-19 NOTE — Telephone Encounter (Signed)
Called patient to schedule an appointment for the [facility]. I was unable to reach the patient so I left a HIPAA-compliant message requesting that the patient return my call.   

## 2017-03-22 MED FILL — XOLAIR 150 MG SOLR: 150 | 28 days supply | Qty: 4 | Fill #0

## 2017-03-26 ENCOUNTER — Telehealth: Payer: Self-pay | Admitting: Pulmonary Disease

## 2017-03-26 MED ORDER — OMALIZUMAB 150 MG ~~LOC~~ SOLR: 300.0000 mg | SUBCUTANEOUS | 5 refills | Status: DC

## 2017-03-26 NOTE — Telephone Encounter (Signed)
Rx sent 

## 2017-03-29 NOTE — Telephone Encounter (Deleted)
Rx sent 

## 2017-04-02 ENCOUNTER — Ambulatory Visit (INDEPENDENT_AMBULATORY_CARE_PROVIDER_SITE_OTHER): Payer: 59

## 2017-04-02 ENCOUNTER — Telehealth: Payer: Self-pay | Admitting: Pulmonary Disease

## 2017-04-02 DIAGNOSIS — J4540 Moderate persistent asthma, uncomplicated: Secondary | ICD-10-CM | POA: Diagnosis not present

## 2017-04-02 NOTE — Telephone Encounter (Signed)
#   Vials:4 Arrival Date:04/02/2017 Lot #:[id number] Exp Date:06/2020  Pt. Brings med. In.

## 2017-04-05 MED ORDER — OMALIZUMAB 150 MG ~~LOC~~ SOLR
300.0000 mg | SUBCUTANEOUS | Status: DC
  Administered 2017-04-02: 300 mg via SUBCUTANEOUS

## 2017-04-05 NOTE — Progress Notes (Signed)
Xolair injection documentation and charges entered by Maury Dus, RMA, based on injection sheet filled out by Alroy Bailiff per office protocol.  This documentation process is due to office requirements.

## 2017-04-08 NOTE — Telephone Encounter (Addendum)
Called in rx, pt picked up xolair and brought it with her. Gave pt. Her inj. She had a dose on hand. She forgot about. Nothing further needed.

## 2017-04-27 MED FILL — VIT D2 1.25 MG (50,000 UNIT: 1.25 MG | 28 days supply | Qty: 8 | Fill #2

## 2017-04-27 MED FILL — MONTELUKAST SOD 10 MG TAB: 10 | 90 days supply | Qty: 90 | Fill #1

## 2017-04-27 MED FILL — METFORMIN HCL ER 500 MG TAB: 500 | 30 days supply | Qty: 30 | Fill #5

## 2017-05-04 ENCOUNTER — Ambulatory Visit: Payer: 59 | Admitting: Adult Health

## 2017-05-04 ENCOUNTER — Encounter: Payer: Self-pay | Admitting: Adult Health

## 2017-05-04 DIAGNOSIS — J4550 Severe persistent asthma, uncomplicated: Secondary | ICD-10-CM

## 2017-05-04 DIAGNOSIS — J209 Acute bronchitis, unspecified: Secondary | ICD-10-CM

## 2017-05-04 DIAGNOSIS — J302 Other seasonal allergic rhinitis: Secondary | ICD-10-CM | POA: Diagnosis not present

## 2017-05-04 MED ORDER — LEVOFLOXACIN 500 MG PO TABS: 500.0000 mg | ORAL_TABLET | Freq: Every day | ORAL | 0 refills | Status: AC

## 2017-05-04 MED ORDER — PREDNISONE 10 MG PO TABS: ORAL_TABLET | ORAL | 0 refills | Status: DC

## 2017-05-04 MED FILL — levoFLOXacin 500 MG TABS: 500 | 5 days supply | Qty: 5 | Fill #0

## 2017-05-04 MED FILL — predniSONE 10 MG TABS: 10 | 8 days supply | Qty: 20 | Fill #0

## 2017-05-04 NOTE — Progress Notes (Signed)
@Patient  ID: Elaine Luna, female    DOB: 1945/09/19, 72 y.o.   MRN: [id number]  Chief Complaint  Patient presents with  . 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  She complains over the last 3 weeks that she is had increased cough, congestion, sinus discharge and wheezing.  She remains on her Dulera twice daily and Qvar twice daily.  She is on Xolair injections.  She has not got her last Xolair injection due to her cough and wheezing.  She has been very well controlled over the last year with no recent prednisone or antibiotic use. She denies any hemoptysis, fever, chest pain, orthopnea or edema.     No Known Allergies  Immunization History  Administered Date(s) Administered  . Influenza Split 11/02/2012, 11/05/2014, 10/04/2015  . Influenza Whole 10/13/2007, 11/03/2010, 11/06/2011  . Influenza, High Dose Seasonal PF 11/02/2016  . Influenza-Unspecified 11/02/2013  . Pneumococcal Conjugate-13 11/02/2012  . Pneumococcal Polysaccharide-23 02/03/2008    Past Medical History:  Diagnosis Date  . Asthma   . Colon polyps   . DM type 2 (diabetes mellitus, type 2) ([place]) 2015   . Hyperlipidemia   . Hypertension   . Obesity   . 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 Tubular adenoma     Tobacco History: Social History   Tobacco Use  Smoking Status Former Smoker  . Packs/day: 0.30  . Years: 10.00  . Pack years: 3.00  . Types: Cigarettes  . Last attempt to quit: 02/03/1975  . Years since quitting: 42.2  Smokeless Tobacco Never Used   Counseling given: Not Answered   Outpatient Encounter  Medications as of 05/04/2017  Medication Sig  . albuterol (PROVENTIL) (2.5 MG/3ML) 0.083% nebulizer solution Take 3 mLs (2.5 mg total) by nebulization every 6 (six) hours as needed for wheezing.  Marland Kitchen atorvastatin (LIPITOR) 20 MG tablet Take 20 mg by mouth daily.  . beclomethasone (QVAR REDIHALER) 80 MCG/ACT inhaler Inhale 2 puffs into the lungs 2 (two) times daily.  . Denosumab (PROLIA Hubbard) Inject into the skin every 6 (six) months.  . hydrochlorothiazide (MICROZIDE) 12.5 MG capsule Take 12.5 mg by mouth daily.  . irbesartan (AVAPRO) 300 MG tablet Take 300 mg by mouth daily.   . metFORMIN (GLUCOPHAGE-XR) 500 MG 24 hr tablet Take 1 tablet by mouth daily.  . mometasone-formoterol (DULERA) 200-5 MCG/ACT AERO INHALE 2 PUFFS BY MOUTH 2 TIMES DAILY.  . montelukast (SINGULAIR) 10 MG tablet Take 1 tablet (10 mg total) by mouth daily.  . VENTOLIN HFA 108 (90 Base) MCG/ACT inhaler INHALE 1-2 PUFFS BY MOUTH FOUR TIMES DAILY AS NEEDED  . Vitamin D, Ergocalciferol, (DRISDOL) 50000 UNITS CAPS Take 50,000 Units by mouth. Twice weekly  . levofloxacin (LEVAQUIN) 500 MG tablet Take 1 tablet (500 mg total) by mouth daily for 5 days.  . predniSONE (DELTASONE) 10 MG tablet 4 tabs for 2 days, then 3 tabs for 2 days, 2 tabs for 2 days, then 1 tab for 2 days, then stop  . [DISCONTINUED] predniSONE (DELTASONE) 10 MG tablet Take 4 tabs once daily x3 days, then 3 tabs for 3 days,  2 tabs for 3 days, 1 tab for 3 days and stop. (Patient not taking: Reported on 05/04/2017)   Facility-Administered Encounter Medications as of 05/04/2017  Medication  . omalizumab Arvid Right) injection 300 mg     Review of Systems  Constitutional:   No  weight loss, night sweats,  Fevers, chills, fatigue, or  lassitude.  HEENT:   No headaches,  Difficulty swallowing,  Tooth/dental problems, or  Sore throat,                No sneezing, itching, ear ache,  +nasal congestion, post nasal drip,   CV:  No chest pain,  Orthopnea, PND, swelling in lower  extremities, anasarca, dizziness, palpitations, syncope.   GI  No heartburn, indigestion, abdominal pain, nausea, vomiting, diarrhea, change in bowel habits, loss of appetite, bloody stools.   Resp:   No chest wall deformity  Skin: no rash or lesions.  GU: no dysuria, change in color of urine, no urgency or frequency.  No flank pain, no hematuria   MS:  No joint pain or swelling.  No decreased range of motion.  No back pain.    Physical Exam  BP 136/64 (BP Location: Left Arm, Cuff Size: Normal)   Pulse 64   Ht 5\' 3"  (1.6 m)   Wt 172 lb 9.6 oz (78.3 kg)   SpO2 99%   BMI 30.57 kg/m   GEN: A/Ox3; pleasant , NAD    HEENT:  [place]/AT,   NOSE-clear, THROAT-clear, no lesions, no postnasal drip or exudate noted.   NECK:  Supple w/ fair ROM; no JVD; normal carotid impulses w/o bruits; no thyromegaly or nodules palpated; no lymphadenopathy.    RESP  Few trace wheezing , speaks in full sentences ,  no accessory muscle use, no dullness to percussion  CARD:  RRR, no m/r/g, no peripheral edema, pulses intact, no cyanosis or clubbing.  GI:   Soft & nt; nml bowel sounds; no organomegaly or masses detected.   Musco: Warm bil, no deformities or joint swelling noted.   Neuro: alert, no focal deficits noted.    Skin: Warm, no lesions or rashes    Lab Results:  CBC  BMET  BNP No results found for: BNP  ProBNP  Imaging: No results found.   Assessment & Plan:   Asthma Flare with associated bronchitis and allergic rhinitis  Plan  Patient Instructions  Levaquin 500mg  daily for 5 days , take with food.  Prednisone taper over next week.  Mucinex DM Twice daily   Fluids and rest  Saline nasal rinses As needed   Follow up with Dr. [place] Bells in 4 momths and As needed   Please contact office for sooner follow up if symptoms do not improve or worsen or seek emergency care        Allergic rhinitis Flare  Plan  Patient Instructions  Levaquin 500mg  daily for 5 days , take  with food.  Prednisone taper over next week.  Mucinex DM Twice daily   Fluids and rest  Saline nasal rinses As needed   Follow up with Dr. [place] Bells in 4 momths and As needed   Please contact office for sooner follow up if symptoms do not improve or worsen or seek emergency care        Acute bronchitis Flare   Plan  Patient Instructions  Levaquin 500mg  daily for 5 days , take with food.  Prednisone taper over next week.  Mucinex DM Twice daily   Fluids and rest  Saline nasal rinses As needed   Follow up with Dr. [place] Bells in 4 momths and As needed   Please contact office for sooner follow up if symptoms do not improve or worsen or seek emergency care           Rexene Edison, NP 05/04/2017

## 2017-05-04 NOTE — Assessment & Plan Note (Signed)
Flare with associated bronchitis and allergic rhinitis  Plan  Patient Instructions  Levaquin 500mg  daily for 5 days , take with food.  Prednisone taper over next week.  Mucinex DM Twice daily   Fluids and rest  Saline nasal rinses As needed   Follow up with Dr. [place] Bells in 4 momths and As needed   Please contact office for sooner follow up if symptoms do not improve or worsen or seek emergency care

## 2017-05-04 NOTE — Patient Instructions (Addendum)
Levaquin 500mg  daily for 5 days , take with food.  Prednisone taper over next week.  Mucinex DM Twice daily   Fluids and rest  Saline nasal rinses As needed   Follow up with Dr. [place] Bells in 4 momths and As needed   Please contact office for sooner follow up if symptoms do not improve or worsen or seek emergency care

## 2017-05-04 NOTE — Progress Notes (Signed)
Reviewed, agree 

## 2017-05-04 NOTE — Assessment & Plan Note (Signed)
Flare   Plan  Patient Instructions  Levaquin 500mg  daily for 5 days , take with food.  Prednisone taper over next week.  Mucinex DM Twice daily   Fluids and rest  Saline nasal rinses As needed   Follow up with Dr. [place] Bells in 4 momths and As needed   Please contact office for sooner follow up if symptoms do not improve or worsen or seek emergency care

## 2017-05-04 NOTE — Assessment & Plan Note (Signed)
Flare  Plan  Patient Instructions  Levaquin 500mg  daily for 5 days , take with food.  Prednisone taper over next week.  Mucinex DM Twice daily   Fluids and rest  Saline nasal rinses As needed   Follow up with Dr. [place] Bells in 4 momths and As needed   Please contact office for sooner follow up if symptoms do not improve or worsen or seek emergency care

## 2017-05-13 ENCOUNTER — Ambulatory Visit (INDEPENDENT_AMBULATORY_CARE_PROVIDER_SITE_OTHER): Payer: 59

## 2017-05-13 DIAGNOSIS — J4551 Severe persistent asthma with (acute) exacerbation: Secondary | ICD-10-CM

## 2017-05-14 MED ORDER — OMALIZUMAB 150 MG ~~LOC~~ SOLR
300.0000 mg | SUBCUTANEOUS | Status: DC
  Administered 2017-05-13: 300 mg via SUBCUTANEOUS

## 2017-05-17 MED FILL — QVAR REDIHALER 80 MCG/ACT A: 80 | 30 days supply | Qty: 11 | Fill #3

## 2017-05-17 MED FILL — ATORVASTATIN 20 MG TABLET: 20 | 90 days supply | Qty: 90 | Fill #2

## 2017-06-02 MED FILL — IRBESARTAN 300 MG TABLET: 300 | 90 days supply | Qty: 90 | Fill #3

## 2017-06-02 MED FILL — VIT D2 1.25 MG (50,000 UNIT: 1.25 MG | 28 days supply | Qty: 8 | Fill #3

## 2017-06-02 MED FILL — METFORMIN HCL ER 500 MG TAB: 500 | 30 days supply | Qty: 30 | Fill #6

## 2017-06-10 ENCOUNTER — Ambulatory Visit: Payer: Self-pay

## 2017-06-11 ENCOUNTER — Ambulatory Visit (INDEPENDENT_AMBULATORY_CARE_PROVIDER_SITE_OTHER): Payer: 59

## 2017-06-11 DIAGNOSIS — J4551 Severe persistent asthma with (acute) exacerbation: Secondary | ICD-10-CM | POA: Diagnosis not present

## 2017-06-14 MED ORDER — OMALIZUMAB 150 MG ~~LOC~~ SOLR
300.0000 mg | Freq: Once | SUBCUTANEOUS | Status: AC
  Administered 2017-06-11: 300 mg via SUBCUTANEOUS

## 2017-06-25 ENCOUNTER — Telehealth: Payer: Self-pay | Admitting: Pulmonary Disease

## 2017-06-25 NOTE — Telephone Encounter (Signed)
Routing to TS to help with Xolair

## 2017-06-25 NOTE — Telephone Encounter (Signed)
Called pt. To ask her if I could take care of this 06/29/17, "Yes of course, I'm going to be gone a few days next wk. Anyway.

## 2017-07-02 NOTE — Telephone Encounter (Signed)
PA was started by Elaine Luna on 06-30-17 and will need to give about 72 hours for decision.

## 2017-07-05 DIAGNOSIS — J45909 Unspecified asthma, uncomplicated: Secondary | ICD-10-CM | POA: Diagnosis not present

## 2017-07-05 DIAGNOSIS — Z6831 Body mass index (BMI) 31.0-31.9, adult: Secondary | ICD-10-CM | POA: Diagnosis not present

## 2017-07-05 DIAGNOSIS — I10 Essential (primary) hypertension: Secondary | ICD-10-CM | POA: Diagnosis not present

## 2017-07-05 DIAGNOSIS — E119 Type 2 diabetes mellitus without complications: Secondary | ICD-10-CM | POA: Diagnosis not present

## 2017-07-05 DIAGNOSIS — I700 Atherosclerosis of aorta: Secondary | ICD-10-CM | POA: Diagnosis not present

## 2017-07-05 DIAGNOSIS — N183 Chronic kidney disease, stage 3 (moderate): Secondary | ICD-10-CM | POA: Diagnosis not present

## 2017-07-05 DIAGNOSIS — E668 Other obesity: Secondary | ICD-10-CM | POA: Diagnosis not present

## 2017-07-05 DIAGNOSIS — M810 Age-related osteoporosis without current pathological fracture: Secondary | ICD-10-CM | POA: Diagnosis not present

## 2017-07-05 NOTE — Telephone Encounter (Signed)
Pt is calling to check status of PA for Xolair. Cb is [phone].

## 2017-07-06 ENCOUNTER — Other Ambulatory Visit: Payer: Self-pay | Admitting: Internal Medicine

## 2017-07-06 MED ORDER — OMALIZUMAB 150 MG ~~LOC~~ SOLR: 300.0000 mg | SUBCUTANEOUS | 3 refills | Status: DC

## 2017-07-07 MED FILL — DULERA 200 MCG/5 MCG INH: 200-5 | 30 days supply | Qty: 13 | Fill #1

## 2017-07-07 MED FILL — VIT D2 1.25 MG (50,000 UNIT: 1.25 MG | 28 days supply | Qty: 8 | Fill #4

## 2017-07-07 MED FILL — QVAR REDIHALER 80 MCG/ACT A: 80 | 30 days supply | Qty: 11 | Fill #4

## 2017-07-07 MED FILL — METFORMIN HCL ER 500 MG TAB: 500 | 30 days supply | Qty: 30 | Fill #0

## 2017-07-07 NOTE — Telephone Encounter (Signed)
[facility] pharm, states the PA was for the vials rather than the prefilled syringes. Cb is [phone].

## 2017-07-07 NOTE — Telephone Encounter (Signed)
Called [facility] back and spoke with Butch Penny. She gave me th [facility] # for the xolair syringes: [phone]. I'm going to call [employer] back and see if I can give them this # and pt will hopefully be able to get the syringes.  The [employer] rep I spoke to this time asked me to call back in 2 hrs. That would be 5:25. I probably call at 4:50 in case they close at 5:00.

## 2017-07-07 NOTE — Telephone Encounter (Signed)
Did new P/A on pt thru cmm.(syringes) The computer prompted me to call [employer], so I did. They said her Arvid Right had already been approved. Ref.# [id number], effective dates: 07/01/17 thru 5/20. I then call [facility] gave them the info. And it wouldn't go thru. [facility] is going to call [place]. I called the pt and made her aware of what is going on.

## 2017-07-08 MED FILL — XOLAIR 150 MG SOLR: 150 | 28 days supply | Qty: 4 | Fill #1

## 2017-07-08 NOTE — Telephone Encounter (Signed)
Pt. Is approved for xolair vials so she going to pick those up for tomorrow's inj.. Pt's has missed one or two wks., she doesn't want to miss anymore. Still working on P/A for [employer].

## 2017-07-08 NOTE — Telephone Encounter (Signed)
Pt is calling to sched Xolair Inj. Pt there were issues getting the PA for the Xolair. Cb is [phone]

## 2017-07-09 ENCOUNTER — Ambulatory Visit (INDEPENDENT_AMBULATORY_CARE_PROVIDER_SITE_OTHER): Payer: 59

## 2017-07-09 DIAGNOSIS — J4550 Severe persistent asthma, uncomplicated: Secondary | ICD-10-CM | POA: Diagnosis not present

## 2017-07-09 NOTE — Telephone Encounter (Signed)
I called to finish P/A and was put on eternal hold. Will call 07/12/17.

## 2017-07-12 MED ORDER — OMALIZUMAB 150 MG ~~LOC~~ SOLR
300.0000 mg | Freq: Once | SUBCUTANEOUS | Status: AC
  Administered 2017-07-09: 300 mg via SUBCUTANEOUS

## 2017-07-13 ENCOUNTER — Encounter: Payer: Self-pay | Admitting: *Deleted

## 2017-07-13 NOTE — Telephone Encounter (Signed)
TS just a reminder to follow up on PA

## 2017-07-20 ENCOUNTER — Other Ambulatory Visit: Payer: Self-pay | Admitting: Internal Medicine

## 2017-07-20 MED ORDER — OMALIZUMAB 150 MG/ML ~~LOC~~ SOSY: 300.0000 mg | PREFILLED_SYRINGE | SUBCUTANEOUS | 6 refills | Status: DC

## 2017-07-20 NOTE — Telephone Encounter (Signed)
Tammy please advise on status of PA for pt and if we can close encounter. Thanks!

## 2017-07-20 NOTE — Telephone Encounter (Signed)
PA approved for PFS as of 07-14-17 through 07-14-2018. Spoke with pharmacy-confirmed they have Rx on file and it will go through insurance.   Pt will need to make sure to let pharmacy know to fill PFS from now on and not the vials. No DPR on file stating okay to leave detailed message on VM-therefore I left message for patient to call back and ask for [facility]. Tammy can tell patient needed information and close encounter.

## 2017-07-21 NOTE — Telephone Encounter (Signed)
Pt is calling back [phone] 

## 2017-07-21 NOTE — Telephone Encounter (Signed)
Called and spoke with pt stating the information per [facility].  Pt expressed understanding. Nothing further needed.

## 2017-07-29 ENCOUNTER — Ambulatory Visit (INDEPENDENT_AMBULATORY_CARE_PROVIDER_SITE_OTHER): Payer: 59

## 2017-07-29 DIAGNOSIS — J4550 Severe persistent asthma, uncomplicated: Secondary | ICD-10-CM

## 2017-08-03 MED ORDER — OMALIZUMAB 150 MG ~~LOC~~ SOLR
300.0000 mg | Freq: Once | SUBCUTANEOUS | Status: AC
  Administered 2017-07-29: 300 mg via SUBCUTANEOUS

## 2017-08-09 ENCOUNTER — Encounter (HOSPITAL_COMMUNITY)
Admission: RE | Admit: 2017-08-09 | Discharge: 2017-08-09 | Disposition: A | Discharge status: Home / Self Care | Payer: 59 | Source: Ambulatory Visit | Attending: Internal Medicine | Admitting: Internal Medicine

## 2017-08-09 DIAGNOSIS — M810 Age-related osteoporosis without current pathological fracture: Secondary | ICD-10-CM | POA: Diagnosis not present

## 2017-08-09 MED ORDER — DENOSUMAB 60 MG/ML ~~LOC~~ SOSY
60.0000 mg | PREFILLED_SYRINGE | Freq: Once | SUBCUTANEOUS | Status: AC
  Administered 2017-08-09: 60 mg via SUBCUTANEOUS
  Filled 2017-08-09: qty 1

## 2017-08-11 MED FILL — VIT D2 1.25 MG (50,000 UNIT: 1.25 MG | 28 days supply | Qty: 8 | Fill #5

## 2017-08-11 MED FILL — METFORMIN HCL ER 500 MG TAB: 500 | 30 days supply | Qty: 30 | Fill #1

## 2017-08-11 MED FILL — MONTELUKAST SOD 10 MG TAB: 10 | 90 days supply | Qty: 90 | Fill #2

## 2017-08-11 MED FILL — XOLAIR 150 MG/ML SOSY: 150 | 28 days supply | Qty: 4 | Fill #0

## 2017-08-12 ENCOUNTER — Ambulatory Visit: Payer: Self-pay

## 2017-08-13 ENCOUNTER — Telehealth: Payer: Self-pay | Admitting: Pulmonary Disease

## 2017-08-13 ENCOUNTER — Ambulatory Visit (INDEPENDENT_AMBULATORY_CARE_PROVIDER_SITE_OTHER): Payer: 59

## 2017-08-13 DIAGNOSIS — J4540 Moderate persistent asthma, uncomplicated: Secondary | ICD-10-CM

## 2017-08-13 NOTE — Telephone Encounter (Signed)
Prefilled Syringes: # 150mg  4  #75mg  0 Arrival Date: 08/13/17 Lot #: 150mg  [id number]      75mg  0 Exp Date: 150mg  02/2018   75mg  0

## 2017-08-16 MED ORDER — OMALIZUMAB 150 MG ~~LOC~~ SOLR
300.0000 mg | SUBCUTANEOUS | Status: DC
  Administered 2017-08-13: 300 mg via SUBCUTANEOUS

## 2017-08-16 NOTE — Progress Notes (Signed)
Documentation of medication administration and charges of Xolair have been completed by Lindsay Lemons, CMA based on the Xolair documentation sheet completed by Tammy Scott.  

## 2017-08-17 ENCOUNTER — Encounter: Payer: Self-pay | Admitting: Pulmonary Disease

## 2017-08-17 ENCOUNTER — Ambulatory Visit: Payer: 59 | Admitting: Pulmonary Disease

## 2017-08-17 VITALS — BP 128/70 | HR 55 | Ht 63.0 in | Wt 172.0 lb

## 2017-08-17 DIAGNOSIS — J4540 Moderate persistent asthma, uncomplicated: Secondary | ICD-10-CM

## 2017-08-17 DIAGNOSIS — J4550 Severe persistent asthma, uncomplicated: Secondary | ICD-10-CM | POA: Diagnosis not present

## 2017-08-17 NOTE — Patient Instructions (Signed)
Severe persistent asthma with associated bronchiectasis: Continue Dulera twice a day Continue Qvar twice a day Continue Xolair injections every 2 weeks If you have worsening symptoms such as worsening daily chest tightness wheezing or shortness of breath or more frequent exacerbations of your asthma please let me know and we can consider changing her biologic agent from Xolair to something new  Otherwise, follow-up with me in 4 to 6 months or sooner if needed

## 2017-08-17 NOTE — Progress Notes (Signed)
Subjective:    Patient ID: Elaine Luna, female    DOB: [birth year], 72 y.o.   MRN: [id number]  Synopsis: Severe persistent asthma followed by Dr. Joya Gaskins prior to 2016, on [place].  aspergillus IgE antibody moderately +2017 High-resolution CT chest with mild cylindrical bronchiectasis and scattered sub-centimeter pulmonary nodules no bigger than 4 mm  HPI Chief Complaint  Patient presents with  . Follow-up   Elaine Luna has been doing well since the last visit.  She says that whenever she is around her grandchildren and catches a virus she may get sick and need to take some prednisone and Levaquin but this is not been nearly as frequent as it was several years ago when I last saw her.  She says that in between these episodes she has not had any exacerbations of her asthma, specifically she says she does not have any problems with exercise intolerance, chest tightness, or excessive wheezing.  She continues to take Xolair every 2 weeks, Dulera twice a day and Qvar twice a day.  Past Medical History:  Diagnosis Date  . Asthma   . Colon polyps   . DM type 2 (diabetes mellitus, type 2) ([place]) 2015   . Hyperlipidemia   . Hypertension   . Obesity   . 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 Tubular adenoma       Review of Systems  Constitutional: Negative for chills, fatigue and fever.  HENT: Negative for postnasal drip, rhinorrhea and sinus pressure.   Respiratory: Negative for cough, shortness of breath and wheezing.   Cardiovascular: Negative for chest pain, palpitations and leg swelling.       Objective:   Physical Exam Vitals:   08/17/17 1603  BP: 128/70  Pulse: (!) 55  SpO2: 95%  Weight: 172 lb (78 kg)  Height: 5\' 3"  (1.6 m)    Gen: well appearing HENT: OP clear, TM's clear, neck supple PULM: CTA B, normal percussion CV: RRR, no mgr, trace edema GI: BS+, soft, nontender Derm: no cyanosis or rash Psyche: normal mood and affect   Labs reviewed showing an elevated aspergillus  IgE panel  High-resolution CT chest images personally reviewed showing mild cylindrical bronchiectasis in the right middle lobe and scattered subcentimeter pulmonary nodules.     Assessment & Plan:   Extrinsic asthma, moderate persistent, uncomplicated  Severe persistent asthma without complication  Discussion: Despite the viral respiratory infection induced exacerbations she has had in the last year her asthma has been stable and she has not had worsening control of her disease.  I explained to her today that should this develop (more frequent daily symptoms, or more frequent exacerbations) then it would be very reasonable for [place] to consider changing her biologic controller agent to an IL-4/13 inhibitor.  However, at this time I do not think that is necessary given the minimal symptoms she experiences between viral infections.  Plan: Severe persistent asthma with associated bronchiectasis: Continue Dulera twice a day Continue Qvar twice a day Continue Xolair injections every 2 weeks If you have worsening symptoms such as worsening daily chest tightness wheezing or shortness of breath or more frequent exacerbations of your asthma please let me know and we can consider changing her biologic agent from Xolair to something new  Otherwise, follow-up with me in 4 to 6 months or sooner if needed   Current Outpatient Medications:  .  albuterol (PROVENTIL) (2.5 MG/3ML) 0.083% nebulizer solution, Take 3 mLs (2.5 mg total) by nebulization every 6 (six) hours as  needed for wheezing., Disp: 75 vial, Rfl: 1 .  atorvastatin (LIPITOR) 20 MG tablet, Take 20 mg by mouth daily., Disp: , Rfl:  .  beclomethasone (QVAR REDIHALER) 80 MCG/ACT inhaler, Inhale 2 puffs into the lungs 2 (two) times daily., Disp: 3 Inhaler, Rfl: 1 .  Denosumab (PROLIA Bellamy), Inject into the skin every 6 (six) months., Disp: , Rfl:  .  hydrochlorothiazide (MICROZIDE) 12.5 MG capsule, Take 12.5 mg by mouth daily., Disp: , Rfl:  .   irbesartan (AVAPRO) 300 MG tablet, Take 300 mg by mouth daily. , Disp: , Rfl:  .  metFORMIN (GLUCOPHAGE-XR) 500 MG 24 hr tablet, Take 1 tablet by mouth daily., Disp: , Rfl:  .  mometasone-formoterol (DULERA) 200-5 MCG/ACT AERO, INHALE 2 PUFFS BY MOUTH 2 TIMES DAILY., Disp: 3 Inhaler, Rfl: 1 .  montelukast (SINGULAIR) 10 MG tablet, Take 1 tablet (10 mg total) by mouth daily., Disp: 90 tablet, Rfl: 1 .  Omalizumab (XOLAIR) 150 MG/ML SOSY, Inject 300 mg into the skin every 14 (fourteen) days., Disp: 4 Syringe, Rfl: 6 .  predniSONE (DELTASONE) 10 MG tablet, 4 tabs for 2 days, then 3 tabs for 2 days, 2 tabs for 2 days, then 1 tab for 2 days, then stop, Disp: 20 tablet, Rfl: 0 .  VENTOLIN HFA 108 (90 Base) MCG/ACT inhaler, INHALE 1-2 PUFFS BY MOUTH FOUR TIMES DAILY AS NEEDED, Disp: 3 Inhaler, Rfl: 1 .  Vitamin D, Ergocalciferol, (DRISDOL) 50000 UNITS CAPS, Take 50,000 Units by mouth. Twice weekly, Disp: , Rfl:   Current Facility-Administered Medications:  .  omalizumab Arvid Right) injection 300 mg, 300 mg, Subcutaneous, Q14 Days, McQuaid, Douglas B, MD, 300 mg at 08/13/17 1502

## 2017-08-24 IMAGING — CT CT CHEST HIGH RESOLUTION W/O CM
2 of 6 series · 15 of 36 positions shown, 18 images · non-contrast
Comparison: 03/07/2015 high-resolution chest CT.

CLINICAL DATA: Follow-up pulmonary nodules.

EXAM:
CT CHEST WITHOUT CONTRAST
TECHNIQUE: Multidetector CT imaging of the chest was performed following the
standard protocol without intravenous contrast. High resolution
imaging of the lungs, as well as inspiratory and expiratory imaging,
was performed.

[Series 2: high resolution · axial · 0.65mm/px · z∈[-302,-48]mm · 12 of 143 slices shown, 15 images]
[im 8/143  mediastinal]
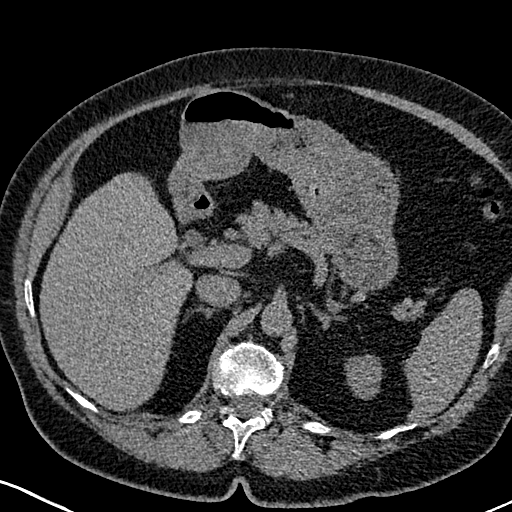
[im 8/143  lung]
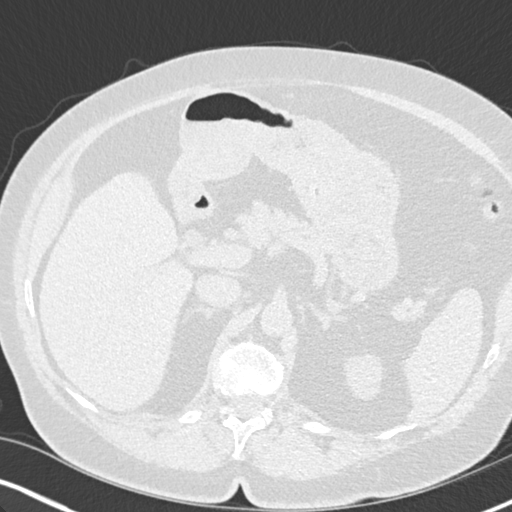
[im 23/143  lung]
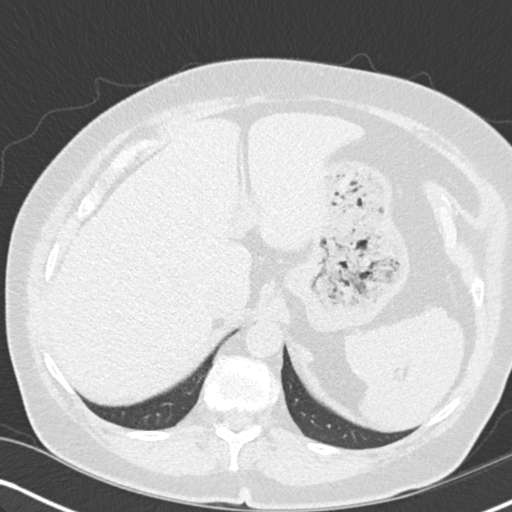
[im 30/143  lung]
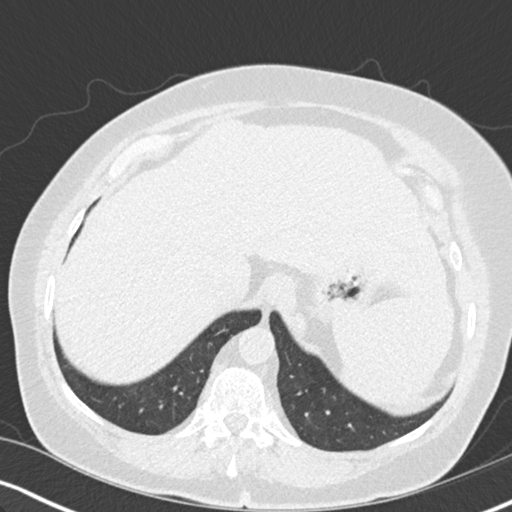
[im 45/143  lung]
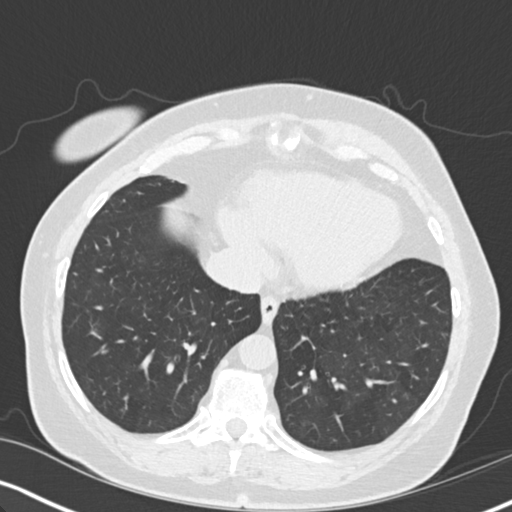
[im 53/143  mediastinal]
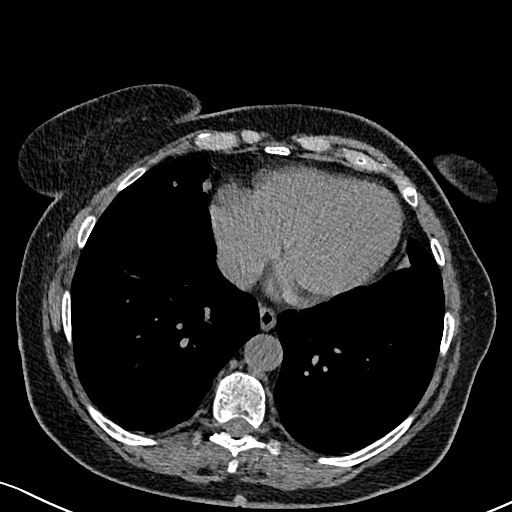
[im 53/143  lung]
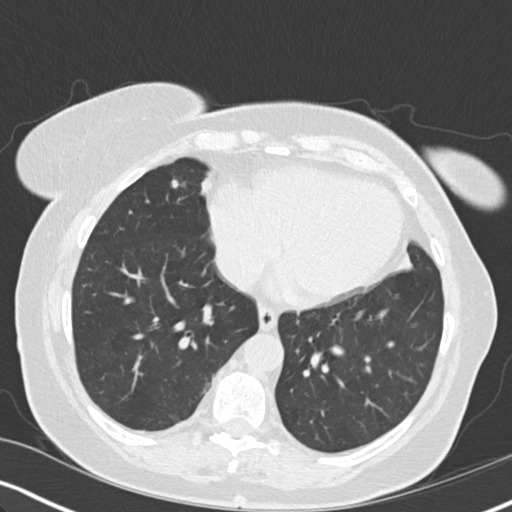
[im 68/143  lung]
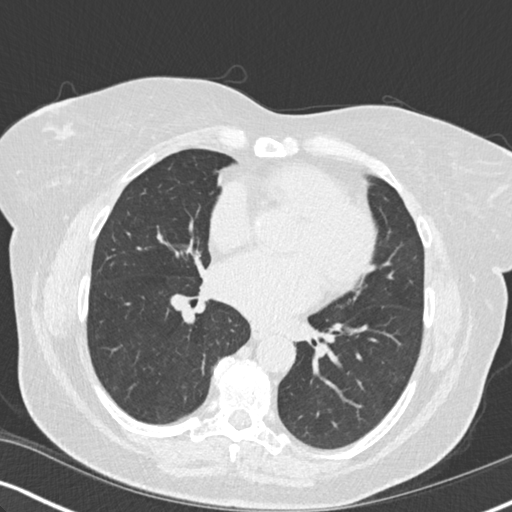
[im 75/143  lung]
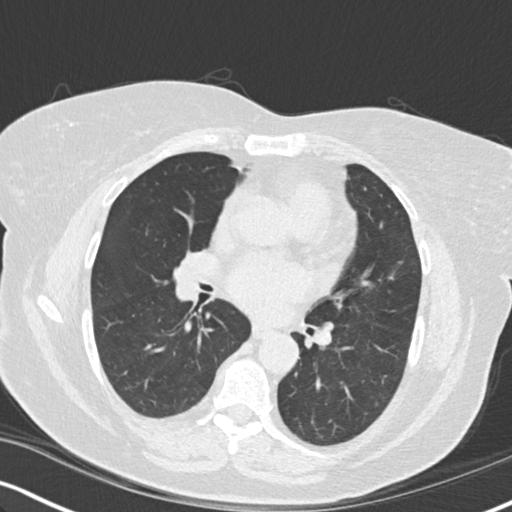
[im 90/143  lung]
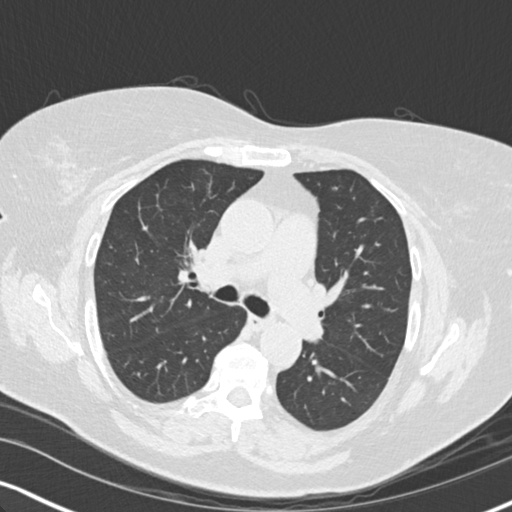
[im 98/143  mediastinal]
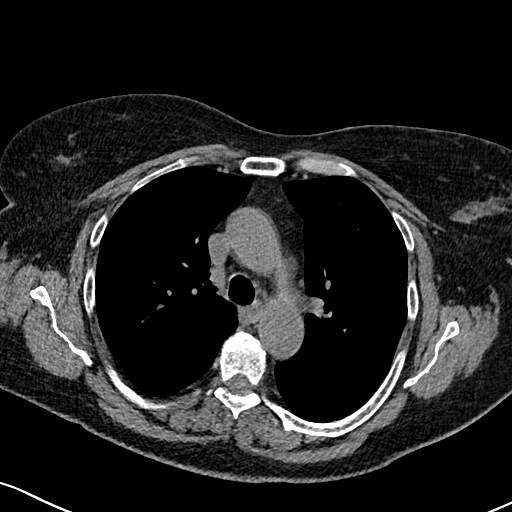
[im 98/143  lung]
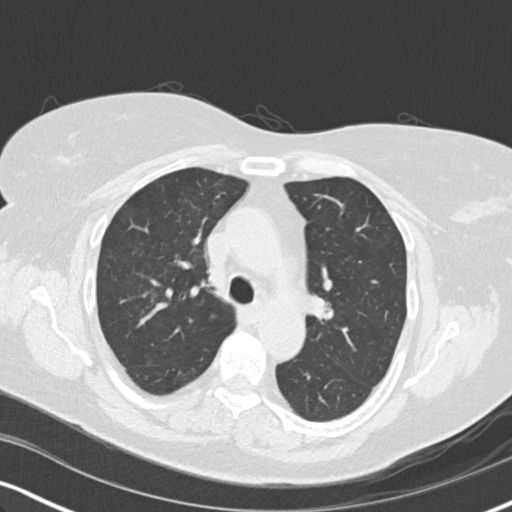
[im 113/143  lung]
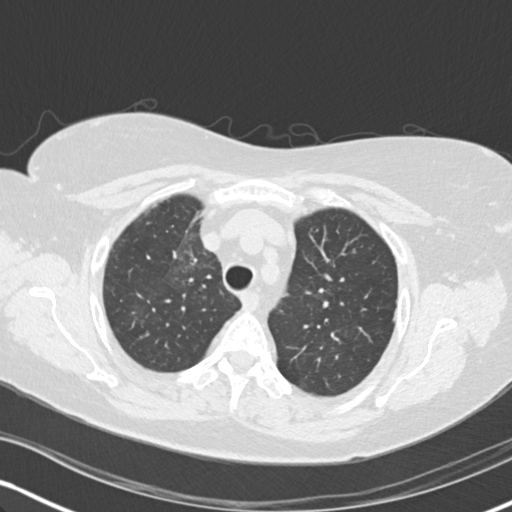
[im 120/143  lung]
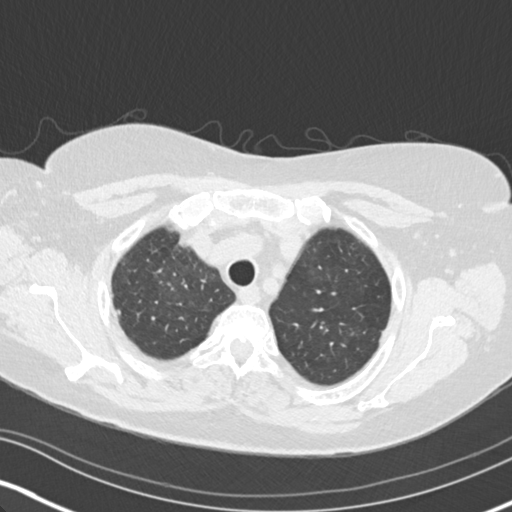
[im 135/143  lung]
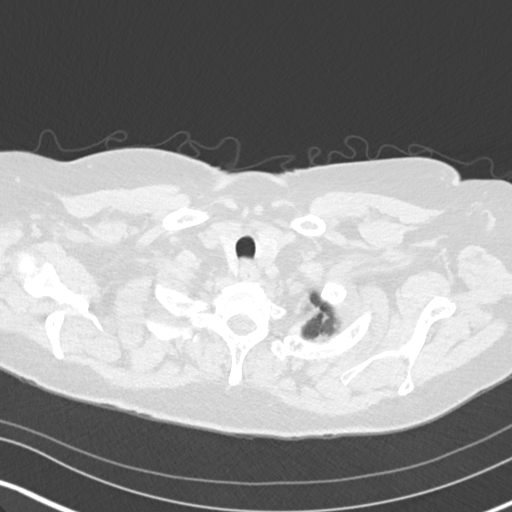

[Series 8: coronal · coronal · 0.59mm/px · 3 of 116 slices shown]
[im 24/116  lung]
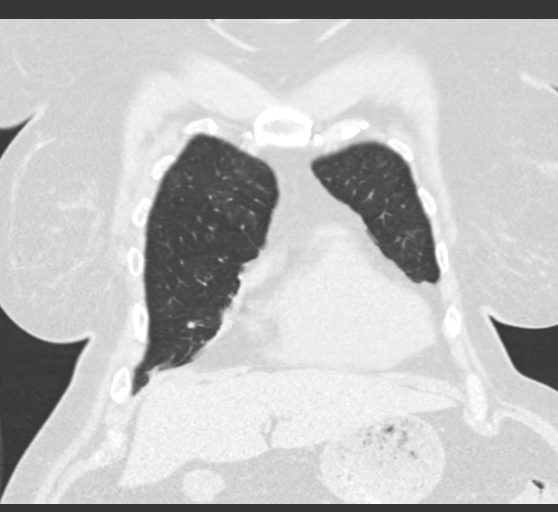
[im 47/116  lung]
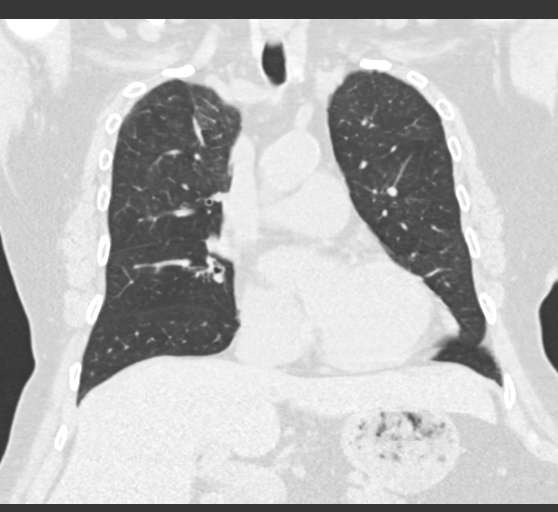
[im 70/116  lung]
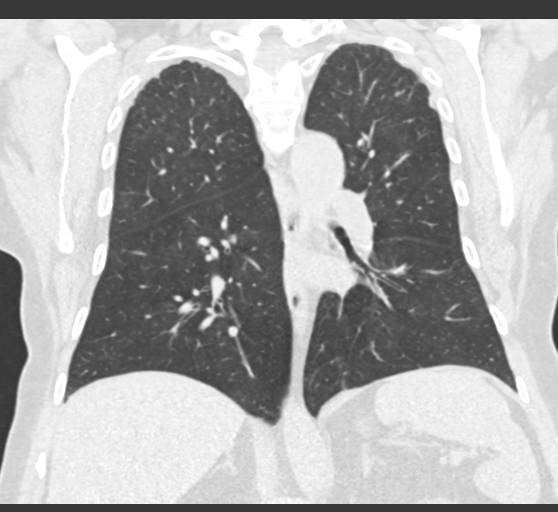

[15 of 36 positions shown; findings below may reference images not displayed]

FINDINGS: Cardiovascular: Normal heart size. No significant pericardial
fluid/thickening. Atherosclerotic nonaneurysmal thoracic aorta.
Normal caliber pulmonary arteries.

Mediastinum/Nodes: Stable hypodense 1.2 cm posterior left thyroid
lobe nodule. Unremarkable esophagus. No pathologically enlarged
axillary, mediastinal or gross hilar lymph nodes, noting limited
sensitivity for the detection of hilar adenopathy on this
noncontrast study.

Lungs/Pleura: No pneumothorax. No pleural effusion. No acute
consolidative airspace disease or lung masses. Patchy air trapping
in both lungs on the expiration sequence is not appreciably changed.
Scattered areas of mild cylindrical and varicoid bronchiectasis in
the basilar right upper lobe, medial right middle lobe, lingula and
peripheral basilar right lower lobe, not appreciably changed. No new
significant regions of bronchiectasis. Several scattered tiny solid
pulmonary nodules throughout both lungs, largest 4 mm in the right
middle lobe (series 3/ image 91) and 4 mm in the anterior left lower
lobe (series 3/ image 88), all unchanged since 03/07/2015 and
considered benign. No new significant pulmonary nodules. No
significant regions of subpleural reticulation, parenchymal banding,
architectural distortion or frank honeycombing.

Upper abdomen: Unremarkable.

Musculoskeletal: No aggressive appearing focal osseous lesions. Mild
thoracic spondylosis.
IMPRESSION: 1. Interval stability of scattered tiny pulmonary nodules, largest 4
mm, for which 12 month stability has been demonstrated, considered
benign, probably postinflammatory.
2. Stable scattered areas of mild cylindrical and varicoid
bronchiectasis in both lungs.
3. Stable patchy air trapping in both lungs, indicating small
airways disease.
4. Aortic atherosclerosis.

## 2017-09-06 MED FILL — QVAR REDIHALER 80 MCG/ACT A: 80 | 30 days supply | Qty: 11 | Fill #0

## 2017-09-06 MED FILL — DULERA 200 MCG/5 MCG INH: 200-5 | 30 days supply | Qty: 13 | Fill #2

## 2017-09-08 MED FILL — XOLAIR 150 MG/ML SOSY: 150 | 28 days supply | Qty: 4 | Fill #1

## 2017-09-10 ENCOUNTER — Ambulatory Visit (INDEPENDENT_AMBULATORY_CARE_PROVIDER_SITE_OTHER): Payer: 59

## 2017-09-10 DIAGNOSIS — J4540 Moderate persistent asthma, uncomplicated: Secondary | ICD-10-CM

## 2017-09-13 MED ORDER — OMALIZUMAB 150 MG ~~LOC~~ SOLR
300.0000 mg | Freq: Once | SUBCUTANEOUS | Status: AC
  Administered 2017-09-10: 300 mg via SUBCUTANEOUS

## 2017-09-13 MED FILL — ATORVASTATIN CALCIUM 20 MG: 20 | 90 days supply | Qty: 90 | Fill #0

## 2017-09-13 MED FILL — IRBESARTAN 300 MG TABS: 300 | 90 days supply | Qty: 90 | Fill #0

## 2017-09-13 MED FILL — METFORMIN HCL ER 500 MG TAB: 500 | 30 days supply | Qty: 30 | Fill #2

## 2017-09-13 MED FILL — VIT D2 1.25 MG (50,000 UNIT: 1.25 MG | 28 days supply | Qty: 8 | Fill #6

## 2017-10-08 ENCOUNTER — Ambulatory Visit (INDEPENDENT_AMBULATORY_CARE_PROVIDER_SITE_OTHER): Payer: 59

## 2017-10-08 DIAGNOSIS — J4540 Moderate persistent asthma, uncomplicated: Secondary | ICD-10-CM | POA: Diagnosis not present

## 2017-10-08 MED ORDER — OMALIZUMAB 150 MG ~~LOC~~ SOLR
300.0000 mg | Freq: Once | SUBCUTANEOUS | Status: AC
  Administered 2017-10-08: 300 mg via SUBCUTANEOUS

## 2017-10-21 MED FILL — VIT D2 1.25 MG (50,000 UNIT: 1.25 MG | 28 days supply | Qty: 8 | Fill #7

## 2017-10-21 MED FILL — HYDROCHLOROTHIAZIDE 25 MG T: 25 | 90 days supply | Qty: 90 | Fill #1

## 2017-10-21 MED FILL — METFORMIN HCL ER 500 MG TAB: 500 | 30 days supply | Qty: 30 | Fill #3

## 2017-10-21 MED FILL — XOLAIR 150 MG/ML SOSY: 150 | 28 days supply | Qty: 4 | Fill #2

## 2017-10-22 ENCOUNTER — Ambulatory Visit (INDEPENDENT_AMBULATORY_CARE_PROVIDER_SITE_OTHER): Payer: 59

## 2017-10-22 DIAGNOSIS — J4540 Moderate persistent asthma, uncomplicated: Secondary | ICD-10-CM

## 2017-10-22 MED ORDER — OMALIZUMAB 150 MG ~~LOC~~ SOLR
300.0000 mg | Freq: Once | SUBCUTANEOUS | Status: AC
  Administered 2017-10-22: 300 mg via SUBCUTANEOUS

## 2017-11-05 ENCOUNTER — Ambulatory Visit (INDEPENDENT_AMBULATORY_CARE_PROVIDER_SITE_OTHER): Payer: 59

## 2017-11-05 DIAGNOSIS — J4550 Severe persistent asthma, uncomplicated: Secondary | ICD-10-CM

## 2017-11-05 MED ORDER — OMALIZUMAB 150 MG ~~LOC~~ SOLR
300.0000 mg | SUBCUTANEOUS | Status: DC
Start: 2017-11-05 — End: 2018-05-11
  Administered 2017-11-05 – 2017-12-24 (×2): 300 mg via SUBCUTANEOUS

## 2017-11-05 NOTE — Progress Notes (Signed)
Documentation of medication administration and charges of Xolair have been completed by Lindsay Lemons, CMA based on the hand written Xolair documentation sheet completed by Tammy Scott, who administered the medication.  

## 2017-11-16 MED FILL — metFORMIN HCL ER 500 MG TB2: 500 | 30 days supply | Qty: 30 | Fill #4

## 2017-11-16 MED FILL — VIT D2 1.25 MG (50,000 UNIT: 1.25 MG | 28 days supply | Qty: 8 | Fill #8

## 2017-12-06 MED FILL — MONTELUKAST SOD 10 MG TAB: 10 | 90 days supply | Qty: 90 | Fill #3

## 2017-12-09 ENCOUNTER — Ambulatory Visit: Payer: 59

## 2017-12-21 MED FILL — XOLAIR 150 MG/ML SOSY: 150 | 28 days supply | Qty: 4 | Fill #3

## 2017-12-24 ENCOUNTER — Telehealth: Payer: Self-pay | Admitting: Pulmonary Disease

## 2017-12-24 ENCOUNTER — Ambulatory Visit (INDEPENDENT_AMBULATORY_CARE_PROVIDER_SITE_OTHER): Payer: 59

## 2017-12-24 DIAGNOSIS — J4550 Severe persistent asthma, uncomplicated: Secondary | ICD-10-CM

## 2017-12-24 NOTE — Telephone Encounter (Signed)
Prefilled Syringes: # 150mg  4  #75mg  0 Arrival Date:12/24/17 Lot #: 150mg  [id number] (1)      75mg  0 Exp Date: 150mg  05/2018 (3) 04/2018 (1)   75mg  0

## 2017-12-27 NOTE — Progress Notes (Signed)
Documented by Jessica Jones CMA based on hand-written Xolair documentation sheet completed by Tammy Scott CMA, who administered the medication.  

## 2018-01-04 MED FILL — ATORVASTATIN CALCIUM 20 MG: 20 | 90 days supply | Qty: 90 | Fill #1

## 2018-01-04 MED FILL — QVAR REDIHALER 80 MCG/ACT A: 80 | 30 days supply | Qty: 11 | Fill #1

## 2018-01-04 MED FILL — DULERA 200 MCG/5 MCG INH: 200-5 | 30 days supply | Qty: 13 | Fill #3

## 2018-01-04 MED FILL — VIT D2 1.25 MG (50,000 UNIT: 1.25 MG | 28 days supply | Qty: 8 | Fill #0

## 2018-01-04 MED FILL — IRBESARTAN 300 MG TAB: 300 | 90 days supply | Qty: 90 | Fill #1

## 2018-01-04 MED FILL — metFORMIN HCL ER 500 MG TB2: 500 | 30 days supply | Qty: 30 | Fill #5

## 2018-01-05 DIAGNOSIS — M810 Age-related osteoporosis without current pathological fracture: Secondary | ICD-10-CM | POA: Diagnosis not present

## 2018-01-05 DIAGNOSIS — Z0000 Encounter for general adult medical examination without abnormal findings: Secondary | ICD-10-CM | POA: Diagnosis not present

## 2018-01-05 DIAGNOSIS — R82998 Other abnormal findings in urine: Secondary | ICD-10-CM | POA: Diagnosis not present

## 2018-01-05 DIAGNOSIS — E119 Type 2 diabetes mellitus without complications: Secondary | ICD-10-CM | POA: Diagnosis not present

## 2018-01-07 DIAGNOSIS — Z0000 Encounter for general adult medical examination without abnormal findings: Secondary | ICD-10-CM | POA: Diagnosis not present

## 2018-01-07 DIAGNOSIS — E559 Vitamin D deficiency, unspecified: Secondary | ICD-10-CM | POA: Diagnosis not present

## 2018-01-07 DIAGNOSIS — E119 Type 2 diabetes mellitus without complications: Secondary | ICD-10-CM | POA: Diagnosis not present

## 2018-01-07 DIAGNOSIS — I10 Essential (primary) hypertension: Secondary | ICD-10-CM | POA: Diagnosis not present

## 2018-01-07 DIAGNOSIS — R1310 Dysphagia, unspecified: Secondary | ICD-10-CM | POA: Diagnosis not present

## 2018-01-07 DIAGNOSIS — I700 Atherosclerosis of aorta: Secondary | ICD-10-CM | POA: Diagnosis not present

## 2018-01-07 DIAGNOSIS — E7849 Other hyperlipidemia: Secondary | ICD-10-CM | POA: Diagnosis not present

## 2018-01-07 DIAGNOSIS — Z1389 Encounter for screening for other disorder: Secondary | ICD-10-CM | POA: Diagnosis not present

## 2018-01-07 DIAGNOSIS — E668 Other obesity: Secondary | ICD-10-CM | POA: Diagnosis not present

## 2018-01-07 DIAGNOSIS — N183 Chronic kidney disease, stage 3 (moderate): Secondary | ICD-10-CM | POA: Diagnosis not present

## 2018-01-11 DIAGNOSIS — Z1212 Encounter for screening for malignant neoplasm of rectum: Secondary | ICD-10-CM | POA: Diagnosis not present

## 2018-01-31 MED FILL — XOLAIR 150 MG/ML SOSY: 150 | 28 days supply | Qty: 4 | Fill #4

## 2018-02-04 ENCOUNTER — Ambulatory Visit (INDEPENDENT_AMBULATORY_CARE_PROVIDER_SITE_OTHER): Payer: 59

## 2018-02-04 ENCOUNTER — Telehealth: Payer: Self-pay | Admitting: Pulmonary Disease

## 2018-02-04 DIAGNOSIS — J4550 Severe persistent asthma, uncomplicated: Secondary | ICD-10-CM

## 2018-02-04 MED ORDER — OMALIZUMAB 150 MG ~~LOC~~ SOLR
300.0000 mg | Freq: Once | SUBCUTANEOUS | Status: AC
  Administered 2018-02-04: 300 mg via SUBCUTANEOUS

## 2018-02-04 NOTE — Telephone Encounter (Signed)
Prefilled Syringes: # 150mg  4  #75mg  0 Arrival Date: 02/04/2018  Lot #: 150mg  [id number]      75mg  0 Exp Date: 150mg  07/2018   75mg  0  Pt brings Xolair in.

## 2018-02-16 MED FILL — VIT D2 1.25 MG (50,000 UNIT: 1.25 MG | 28 days supply | Qty: 8 | Fill #1

## 2018-02-16 MED FILL — HYDROCHLOROTHIAZIDE 25 MG T: 25 | 90 days supply | Qty: 90 | Fill #2

## 2018-02-16 MED FILL — metFORMIN HCL ER 500 MG TB2: 500 | 30 days supply | Qty: 30 | Fill #6

## 2018-02-24 ENCOUNTER — Ambulatory Visit (INDEPENDENT_AMBULATORY_CARE_PROVIDER_SITE_OTHER): Payer: 59

## 2018-02-24 DIAGNOSIS — J4550 Severe persistent asthma, uncomplicated: Secondary | ICD-10-CM

## 2018-02-24 MED ORDER — OMALIZUMAB 150 MG ~~LOC~~ SOLR
300.0000 mg | Freq: Once | SUBCUTANEOUS | Status: AC
Start: 2018-02-24 — End: 2018-02-24
  Administered 2018-02-24: 300 mg via SUBCUTANEOUS

## 2018-02-24 MED ORDER — OMALIZUMAB 150 MG ~~LOC~~ SOLR
150.0000 mg | Freq: Once | SUBCUTANEOUS | Status: AC
  Administered 2018-02-24: 16:00:00 via SUBCUTANEOUS

## 2018-03-14 ENCOUNTER — Other Ambulatory Visit: Payer: Self-pay | Admitting: Adult Health

## 2018-03-14 MED FILL — metFORMIN HCL ER 500 MG TB2: 500 | 90 days supply | Qty: 90 | Fill #0

## 2018-03-14 MED FILL — ALBUTEROL 0.083% INHAL SOLN: (2.5 MG/3ML | 6 days supply | Qty: 75 | Fill #0

## 2018-03-15 ENCOUNTER — Other Ambulatory Visit: Payer: Self-pay | Admitting: Adult Health

## 2018-03-15 MED FILL — VENTOLIN HFA 90 MCG INHALER: 108 (90 BAS | 75 days supply | Qty: 54 | Fill #0

## 2018-03-15 MED FILL — DULERA 200 MCG/5 MCG INH: 200-5 | 30 days supply | Qty: 13 | Fill #0

## 2018-03-15 MED FILL — QVAR REDIHALER 80 MCG/ACT A: 80 | 30 days supply | Qty: 11 | Fill #0

## 2018-03-16 ENCOUNTER — Telehealth: Payer: Self-pay | Admitting: Pulmonary Disease

## 2018-03-17 NOTE — Telephone Encounter (Signed)
Called patient, phone kept ringing with no option to leave voicemail. Will call back.

## 2018-03-18 ENCOUNTER — Ambulatory Visit (INDEPENDENT_AMBULATORY_CARE_PROVIDER_SITE_OTHER): Payer: 59

## 2018-03-18 DIAGNOSIS — J4550 Severe persistent asthma, uncomplicated: Secondary | ICD-10-CM | POA: Diagnosis not present

## 2018-03-18 NOTE — Telephone Encounter (Signed)
Spoke with [employer] and verified pt's information so they can actively enroll pt into the program. Her ID number [id number]. [name] stated she would be here today for her injection, can you relay this information to pt? Thanks

## 2018-03-18 NOTE — Telephone Encounter (Signed)
Yes, ma'am. I wrote the info down, I'll put it in her folder and give it to her when she comes in. Will route to Jonelle Sidle to make her aware.

## 2018-03-21 ENCOUNTER — Telehealth: Payer: Self-pay

## 2018-03-21 NOTE — Telephone Encounter (Signed)
LMTCB.  Received my chart message from patient stating: Apparently I'm coded in your files as having Medicare Part D according to the [employer].  I do not have Part D. I have [facility] employee insurance. I'm unable to be eligible for the coupon for my Cheryl Flash until this is fixed. I don't know how it got changed? I've been getting Cheryl Flash through the [facility] outpatient pharmacy for years with the coupon. Can someone help?  Will route message to Alroy Bailiff to see if this is something she can help with.

## 2018-03-22 MED ORDER — OMALIZUMAB 150 MG ~~LOC~~ SOLR
300.0000 mg | SUBCUTANEOUS | Status: DC
  Administered 2018-03-18: 300 mg via SUBCUTANEOUS

## 2018-03-22 NOTE — Progress Notes (Signed)
Xolair injection documentation and charges entered by Kirrah Mustin, RMA, based on injection sheet filled out by Tammy Scott during preparation and administration. This documentation process is due to office requirements.   

## 2018-03-22 NOTE — Telephone Encounter (Signed)
Routing to [employer] per triage protocol as this is regarding Xolair- please advise on patient assistance card.

## 2018-03-22 NOTE — Telephone Encounter (Signed)
[facility] outpatient pharmacy calling regarding a patient assistance card for patient.  She was told to ask for [facility]. This is information regarding patient's specialty medication. Patient of BQ.

## 2018-03-23 MED FILL — VIT D2 1.25 MG (50,000 UNIT: 1.25 MG | 28 days supply | Qty: 8 | Fill #2

## 2018-03-25 NOTE — Telephone Encounter (Signed)
Please refer to 03/16/2018 ph. Note for updates. Routing to [facility].

## 2018-03-28 MED FILL — XOLAIR 150 MG/ML SOSY: 150 | 28 days supply | Qty: 4 | Fill #5

## 2018-03-31 ENCOUNTER — Telehealth: Payer: Self-pay | Admitting: Pulmonary Disease

## 2018-04-01 ENCOUNTER — Ambulatory Visit (INDEPENDENT_AMBULATORY_CARE_PROVIDER_SITE_OTHER): Payer: 59

## 2018-04-01 DIAGNOSIS — J4550 Severe persistent asthma, uncomplicated: Secondary | ICD-10-CM

## 2018-04-01 NOTE — Telephone Encounter (Signed)
Prefilled Syringes: # 150mg  2  #75mg  0 Arrival Date: 03/31/2018 Lot #: 150mg  [id number]      75mg  0 Exp Date: 150mg  01/2019   75mg  01/2019

## 2018-04-01 NOTE — Telephone Encounter (Signed)
Message routed to [place] for update

## 2018-04-04 MED ORDER — OMALIZUMAB 150 MG ~~LOC~~ SOLR
300.0000 mg | Freq: Once | SUBCUTANEOUS | Status: AC
Start: 2018-04-04 — End: 2018-04-01
  Administered 2018-04-01: 300 mg via SUBCUTANEOUS

## 2018-04-09 ENCOUNTER — Other Ambulatory Visit: Payer: Self-pay | Admitting: Pulmonary Disease

## 2018-04-11 MED FILL — MONTELUKAST SOD 10 MG TAB: 10 | 90 days supply | Qty: 90 | Fill #0 | Status: TO

## 2018-04-18 MED FILL — XOLAIR 150 MG/ML SOSY: 150 | 28 days supply | Qty: 4 | Fill #6

## 2018-04-20 MED FILL — QVAR REDIHALER 80 MCG/ACT A: 80 | 30 days supply | Qty: 11 | Fill #1

## 2018-04-20 MED FILL — IRBESARTAN 300 MG TAB: 300 | 90 days supply | Qty: 90 | Fill #2

## 2018-04-20 MED FILL — VIT D2 1.25 MG (50,000 UNIT: 1.25 MG | 28 days supply | Qty: 8 | Fill #3 | Status: TO

## 2018-04-20 MED FILL — DULERA 200 MCG/5 MCG INH: 200-5 | 30 days supply | Qty: 13 | Fill #1

## 2018-04-20 MED FILL — metFORMIN HCL ER 500 MG TB2: 500 | 90 days supply | Qty: 90 | Fill #1

## 2018-04-20 MED FILL — HYDROCHLOROTHIAZIDE 25 MG T: 25 | 90 days supply | Qty: 90 | Fill #0

## 2018-04-20 MED FILL — ATORVASTATIN 20 MG TABLET: 20 | 90 days supply | Qty: 90 | Fill #2

## 2018-05-02 NOTE — Telephone Encounter (Signed)
Just want to include a conversation that I had with Dr.McQuaid that I will be having my [facility]olairShots [facility] instead of coming into the office.He asked me to send in this note. Thank you Shon Baton  Message routed to [facility] [employer]

## 2018-05-10 ENCOUNTER — Other Ambulatory Visit: Payer: Self-pay | Admitting: Internal Medicine

## 2018-05-11 ENCOUNTER — Other Ambulatory Visit: Payer: Self-pay | Admitting: Pulmonary Disease

## 2018-05-11 ENCOUNTER — Other Ambulatory Visit: Payer: Self-pay | Admitting: Pharmacist

## 2018-05-11 MED ORDER — OMALIZUMAB 150 MG ~~LOC~~ SOLR: SUBCUTANEOUS | 5 refills | Status: DC

## 2018-05-11 MED FILL — XOLAIR 150 MG/ML SOSY: 150 | 28 days supply | Qty: 4 | Fill #0

## 2018-06-17 MED FILL — XOLAIR 150 MG/ML SOSY: 150 | 28 days supply | Qty: 4 | Fill #1

## 2018-06-18 MED FILL — VIT D2 1.25 MG (50,000 UNIT: 1.25 MG | 28 days supply | Qty: 8 | Fill #0

## 2018-06-22 MED FILL — MONTELUKAST SOD 10 MG TAB: 10 | 90 days supply | Qty: 90 | Fill #0

## 2018-07-11 MED FILL — VIT D2 1.25 MG (50,000 UNIT: 1.25 MG | 28 days supply | Qty: 8 | Fill #1

## 2018-07-29 DIAGNOSIS — E669 Obesity, unspecified: Secondary | ICD-10-CM | POA: Diagnosis not present

## 2018-07-29 DIAGNOSIS — J45909 Unspecified asthma, uncomplicated: Secondary | ICD-10-CM | POA: Diagnosis not present

## 2018-07-29 DIAGNOSIS — I129 Hypertensive chronic kidney disease with stage 1 through stage 4 chronic kidney disease, or unspecified chronic kidney disease: Secondary | ICD-10-CM | POA: Diagnosis not present

## 2018-07-29 DIAGNOSIS — E119 Type 2 diabetes mellitus without complications: Secondary | ICD-10-CM | POA: Diagnosis not present

## 2018-07-29 DIAGNOSIS — F4321 Adjustment disorder with depressed mood: Secondary | ICD-10-CM | POA: Diagnosis not present

## 2018-07-29 DIAGNOSIS — N183 Chronic kidney disease, stage 3 (moderate): Secondary | ICD-10-CM | POA: Diagnosis not present

## 2018-08-05 MED FILL — VIT D2 1.25 MG (50,000 UNIT: 1.25 MG | 28 days supply | Qty: 8 | Fill #2

## 2018-08-12 MED FILL — IRBESARTAN 300 MG TABLET: 300 | 90 days supply | Qty: 90 | Fill #0

## 2018-08-22 ENCOUNTER — Telehealth: Payer: Self-pay | Admitting: Pulmonary Disease

## 2018-08-23 NOTE — Telephone Encounter (Signed)
Called and spoke with pt to see if she knew what letter was needed to be sent to pharmacy for her xolair. Pt stated she believed it was a letter of eligibility for her Arvid Right that was needing to be done. Pt said that [place] might be able to provide more info in regards to what all was needed.  Called [facility] and spoke with [facility] to see what was needed in regards to pt's Xolair and she stated a prior authorization was needing to be done for her Xolair. Velta Addison stated as soon as prior Josem Kaufmann was done and we had an approval status to contact them and they would take over after that.

## 2018-08-25 ENCOUNTER — Telehealth: Payer: Self-pay | Admitting: Pulmonary Disease

## 2018-08-25 MED FILL — ATORVASTATIN 20 MG TABLET: 20 | 90 days supply | Qty: 90 | Fill #0

## 2018-08-25 NOTE — Telephone Encounter (Signed)
Spoke pt, she states the insurance is requiring a PA for her Xolair. She states she is already 1 week late on her shot. I will send this message to the injection pool to initiate PA. Please advise.

## 2018-08-26 NOTE — Telephone Encounter (Signed)
Called [facility] @ [phone] and spoke with pharmacy representative [place].  PA able to be done over the phone  Xolair 150 #4 for 28 days  PA to be done for 1 year  No recent hospitalizations  Tried and failed Symbicort, currently on QVAR, Dulera and Singulair  NKDA  ICD10 J45.40  Still using [place], their fax number is [phone]  Directions still the same at 300mg  injection every 2 weeks  Request submitted as URGENT, turn around time = 24hrs  No reference number for this PA   Called spoke with patient, made her aware of the above and that we will be in touch next week with an update.  Patient does not come to this office for the administration of her Xolair, has a coworker administer (patient is a Therapist, sports at the hospital).  Will leave message open in the Injection Pool.

## 2018-08-26 NOTE — Telephone Encounter (Signed)
Duplicate message.  See 08/25/2018 phone note for details.   Will sign off.

## 2018-08-29 NOTE — Telephone Encounter (Signed)
Pt is calling back [phone]

## 2018-08-29 NOTE — Telephone Encounter (Signed)
Called [facility] and spoke with Delilah Shan checking on status of PA for Xolair that was done. Per Delilah Shan, the PA is still in process. I stated to Delilah Shan that the PA as supposed to have been submitted as urgent but per Delilah Shan when it was submitted, it was for some reason submitted as a routing submission having a process turn around time of 24-72 hours.   Called and spoke with pt letting her know this information and stated to her hopefully we would have info for her tomorrow as tomorrow should be the 72hour mark. Pt verbalized understanding. Leaving message in injection pool for it to be checked for status on PA that was submitted over phone with [place].

## 2018-08-30 NOTE — Telephone Encounter (Signed)
Spoke with patient and advised her of the previous message. She stated that she wanted to wait to at least one more day to see what [facility] will say. Advised her that per Ria Comment, we do have an extra dose here in the office. She verbalized understanding.

## 2018-08-30 NOTE — Telephone Encounter (Signed)
Called [facility] to check on the status of this PA. Was advised that they were still needing some clinical questions answered. These have been answered. The rep I spoke to states that they should have a decision within the next hour. Will follow up.  LMTCB x1 for pt. We have a dose on hand here in our office if the pt needs it.

## 2018-08-30 NOTE — Telephone Encounter (Signed)
Pt returning call.Elaine Luna ° °

## 2018-08-31 NOTE — Telephone Encounter (Signed)
Pt is calling back about the prior authorization letter for [place] [phone]

## 2018-08-31 NOTE — Telephone Encounter (Signed)
Called and spoke to pt. Pt is requesting a status update. Called [facility] and was advised after the additional information was received on 7/28 then it is another 72 hour turn around, rep states there will be a decision by 7/31. Called and informed pt of the update, she states she would like to come in to get the one dose we have here. Injection appt for her Xolair has been scheduled for 09/05/2018, pt verbalized understanding.   Will forward to injection pool to follow up on this on 7/31. Pt can be given information on 8/3 during her appt.

## 2018-09-02 MED FILL — XOLAIR 150 MG SOLR: 150 | 14 days supply | Qty: 2 | Fill #0

## 2018-09-02 NOTE — Telephone Encounter (Signed)
Called [facility] and spoke with Elaine Luna who reported that patient has been approved 7.28.2020 through 7.29.2021.  Per [facility] the pharmacy is already aware and has initiated the refill process just this morning.  Approval # [id number]  [facility] spoke with patient and made her aware of the above.  Patient requested to cancel her 8.3.2020 injection appt as the [facility] will have this ready for her to pick up on Monday 8.3.2020.  Appt cancelled. Nothing further needed; will sign off.

## 2018-09-03 MED FILL — VIT D2 1.25 MG (50,000 UNIT: 1.25 MG | 28 days supply | Qty: 8 | Fill #3

## 2018-09-05 ENCOUNTER — Ambulatory Visit: Payer: 59

## 2018-09-06 ENCOUNTER — Telehealth: Payer: Self-pay | Admitting: Pulmonary Disease

## 2018-09-07 ENCOUNTER — Other Ambulatory Visit: Payer: Self-pay

## 2018-09-07 ENCOUNTER — Ambulatory Visit (INDEPENDENT_AMBULATORY_CARE_PROVIDER_SITE_OTHER): Payer: 59

## 2018-09-07 ENCOUNTER — Other Ambulatory Visit: Payer: Self-pay | Admitting: Pharmacist

## 2018-09-07 DIAGNOSIS — J4550 Severe persistent asthma, uncomplicated: Secondary | ICD-10-CM

## 2018-09-07 MED ORDER — XOLAIR 150 MG ~~LOC~~ SOLR: SUBCUTANEOUS | 5 refills | Status: DC

## 2018-09-07 MED ORDER — OMALIZUMAB 150 MG/ML ~~LOC~~ SOSY: 300.0000 mg | PREFILLED_SYRINGE | SUBCUTANEOUS | 11 refills | Status: DC

## 2018-09-07 MED ORDER — OMALIZUMAB 150 MG ~~LOC~~ SOLR
300.0000 mg | SUBCUTANEOUS | Status: DC
  Administered 2018-09-07: 300 mg via SUBCUTANEOUS

## 2018-09-07 NOTE — Progress Notes (Signed)
Have you been hospitalized within the last 10 days?  No Do you have a fever?  No Do you have a cough?  No Do you have a headache or sore throat? No  

## 2018-09-07 NOTE — Telephone Encounter (Signed)
[place] and spoke with pharmacist Elmo Putt  Per Elmo Putt, the script for [place] in April 2020 was for vials but because patient administers these herself, she will need to have a script for prefilled syringes.  The Rx that Elmo Putt is referring to was not sent by this office.  Elmo Putt believes the mixup happened because the Rx was needed/sent during the height of COVID-19 and the pharmacy did not catch that patient has not ever filled the vials, only the prefilled syringes. Per Elmo Putt, patient was given a vial to bring here to the office so that she would not miss this dose.  Correct Rx has been to [facility] and receipt was verified by Elmo Putt.  Called [facility] @ [phone] and spoke with Caryl Pina to initiate another PA for the prefilled syringes.    PA able to be done over the phone:  Xolair PREFILLED SYRINGES 150mg  #4 for 28 days  Inject 300mg  subcutaneously every 14 days  This is ongoing therapy  No recent hospitalizations  Tried and failed Symbicort; currently on QVAR, Dulera and Singulair.  KNDA  ICD-10 J45.40  Request submitted as URGENT, turn around time = 24hrs. No reference number for PA  Called spoke with patient, she is aware of the above.  Patient will come to the office today for administration of the vial that was given to her by the pharmacy.

## 2018-09-08 NOTE — Telephone Encounter (Signed)
Pt came by office yesterday at 2pm. Nothing further needed.

## 2018-09-14 MED FILL — MONTELUKAST SOD 10 MG TAB: 10 | 90 days supply | Qty: 90 | Fill #1

## 2018-09-14 MED FILL — XOLAIR 150 MG/ML SOSY: 150 | 28 days supply | Qty: 4 | Fill #0

## 2018-09-26 MED FILL — HYDROCHLOROTHIAZIDE 25 MG T: 25 | 90 days supply | Qty: 90 | Fill #1

## 2018-10-01 MED FILL — VIT D2 1.25 MG (50,000 UNIT: 1.25 MG | 28 days supply | Qty: 8 | Fill #4

## 2018-10-12 ENCOUNTER — Telehealth: Payer: Self-pay | Admitting: *Deleted

## 2018-10-12 NOTE — Telephone Encounter (Signed)
Provided address and phone contact to [facility] Department.

## 2018-10-19 MED FILL — metFORMIN HCL ER 500 MG TB2: 500 | 90 days supply | Qty: 90 | Fill #2

## 2018-10-31 MED FILL — VIT D2 1.25 MG (50,000 UNIT: 1.25 MG | 42 days supply | Qty: 12 | Fill #0

## 2018-11-09 ENCOUNTER — Ambulatory Visit: Payer: 59 | Admitting: Pulmonary Disease

## 2018-11-10 ENCOUNTER — Encounter: Payer: Self-pay | Admitting: Pulmonary Disease

## 2018-11-10 ENCOUNTER — Ambulatory Visit: Payer: 59 | Admitting: Pulmonary Disease

## 2018-11-10 ENCOUNTER — Other Ambulatory Visit: Payer: Self-pay

## 2018-11-10 VITALS — BP 144/72 | HR 56 | Temp 97.0°F | Ht 64.0 in | Wt 143.8 lb

## 2018-11-10 DIAGNOSIS — J4550 Severe persistent asthma, uncomplicated: Secondary | ICD-10-CM | POA: Diagnosis not present

## 2018-11-10 DIAGNOSIS — R001 Bradycardia, unspecified: Secondary | ICD-10-CM | POA: Diagnosis not present

## 2018-11-10 MED ORDER — QVAR REDIHALER 80 MCG/ACT IN AERB: INHALATION_SPRAY | RESPIRATORY_TRACT | 6 refills | Status: DC

## 2018-11-10 MED FILL — QVAR REDIHALER 80 MCG/ACT A: 80 | 30 days supply | Qty: 11 | Fill #0

## 2018-11-10 NOTE — Progress Notes (Signed)
Cardiology Office Note:   Date:  11/11/2018  NAME:  Elaine Luna    MRN: [id number] DOB:  [birth year]   PCP:  Shon Baton, MD  Cardiologist:  No primary care provider on file.  Electrophysiologist:  None   Referring MD: Margaretha Seeds, MD   Chief Complaint  Patient presents with   Bradycardia    History of Present Illness:   Elaine Luna is a 73 y.o. female with a hx of asthma, covid-19 infection, hypertension, diabetes who is being seen today for the evaluation of bradycardia at the request of Rodman Pickle, MD. Reportedly bradycardic at her pulmonologist office. EKG not obtained.  She reports she was diagnosed with COVID-19 infection in early September, and has recovered from this.  She has since returned to work.  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  She reports since her coronavirus infection she has had worsening fatigue and shortness of breath with exertion.  She was evaluated by her pulmonologist who is managed her asthma appropriately.  She also reports daily flutterings in her chest.  She reports that she can have a sensation of rapid heartbeats that occur worse with exercise but can also occur at rest.  She does get them daily.  They have been going on for 1 month in duration.  She reports no prior history of this in the past.  She denies any chest pain with exertion.  She does report the palpitations bother her and she is worried about them.  She also describes intermittent fullness in her chest.  Review of medical history demonstrates cardiovascular risk factors are high blood pressure, diabetes.  She does have several family members who have had heart disease.  Review of her EKG demonstrates normal sinus rhythm heart rate 71 with frequent PVCs.  Past Medical History: Past Medical History:  Diagnosis Date   Asthma    Colon polyps    DM type 2 (diabetes mellitus, type 2) ([place]) 2015    Hyperlipidemia     Hypertension    Obesity    Tubular adenoma     Past Surgical History: Past Surgical History:  Procedure Laterality Date   APPENDECTOMY     CARDIAC CATHETERIZATION     CATARACT EXTRACTION, BILATERAL  2011   CHOLECYSTECTOMY     ESOPHAGOGASTRODUODENOSCOPY  2011   TONSILLECTOMY     VESICOVAGINAL FISTULA CLOSURE W/ TAH      Current Medications: Current Meds  Medication Sig   albuterol (PROVENTIL) (2.5 MG/3ML) 0.083% nebulizer solution USE 1 VIAL IN NEBULIZER EVERY 6 HOURS AS NEEDED FOR WHEEZING.   atorvastatin (LIPITOR) 20 MG tablet Take 20 mg by mouth daily.   beclomethasone (QVAR REDIHALER) 80 MCG/ACT inhaler INHALE 2 PUFFS BY MOUTH 2 (TWO) TIMES DAILY.   Denosumab (PROLIA [place]) Inject into the skin every 6 (six) months.   hydrochlorothiazide (MICROZIDE) 12.5 MG capsule Take 12.5 mg by mouth daily.   metFORMIN (GLUCOPHAGE-XR) 500 MG 24 hr tablet Take 1 tablet by mouth daily.   mometasone-formoterol (DULERA) 200-5 MCG/ACT AERO INHALE 2 PUFFS BY MOUTH 2 TIMES DAILY.   montelukast (SINGULAIR) 10 MG tablet TAKE 1 TABLET (10 MG TOTAL) BY MOUTH DAILY.   omalizumab Arvid Right) 150 MG/ML prefilled syringe Inject 300 mg into the skin every 14 (fourteen) days.   VENTOLIN HFA 108 (90 Base) MCG/ACT inhaler INHALE 1-2 PUFFS BY MOUTH 4 TIMES DAILY AS NEEDED   Vitamin D, Ergocalciferol, (DRISDOL) 50000 UNITS  CAPS Take 50,000 Units by mouth. Twice weekly   Current Facility-Administered Medications for the 11/11/18 encounter (Office Visit) with Geralynn Rile, MD  Medication   omalizumab Arvid Right) injection 300 mg     Allergies:    Patient has no known allergies.   Social History: Social History   Socioeconomic History   Marital status: Widowed    Spouse name: Not on file   Number of children: 4   Years of education: Not on file   Highest education level: Not on file  Occupational History   Occupation: Programmer, multimedia: [employer]  resource strain: Not on file   Food insecurity    Worry: Not on file    Inability: Not on file   Transportation needs    Medical: Not on file    Non-medical: Not on file  Tobacco Use   Smoking status: Former Smoker    Packs/day: 0.30    Years: 10.00    Pack years: 3.00    Types: Cigarettes    Start date: 33    Quit date: 02/03/1975    Years since quitting: 43.8   Smokeless tobacco: Never Used  Substance and Sexual Activity   Alcohol use: No   Drug use: No   Sexual activity: Not on file  Lifestyle   Physical activity    Days per week: Not on file    Minutes per session: Not on file   Stress: Not on file  Relationships   Social connections    Talks on phone: Not on file    Gets together: Not on file    Attends religious service: Not on file    Active member of club or organization: Not on file    Attends meetings of clubs or organizations: Not on file    Relationship status: Not on file  Other Topics Concern   Not on file  Social History Narrative   Not on file    Family History: The patient's family history includes Asthma in an other family member; Diabetes in her brother, maternal grandfather, and maternal grandmother; Heart disease in her brother, father, and mother.  ROS:   All other ROS reviewed and negative. Pertinent positives noted in the HPI.     EKGs/Labs/Other Studies Reviewed:   The following studies were personally reviewed by me today:  EKG:  EKG is ordered today.  The ekg ordered today demonstrates normal sinus rhythm, heart rate 71, brief ventricular bigeminy noted no acute ST-T changes, no evidence of prior infarction, and was personally reviewed by me.   Recent Labs: No results found for requested labs within last 8760 hours.   Recent Lipid Panel No results found for: CHOL, TRIG, HDL, CHOLHDL, VLDL, LDLCALC, LDLDIRECT  Physical Exam:   VS:  BP (!) 170/74    Pulse 71    Temp 97.8 F (36.6 C)    Ht 5\' 3"  (1.6 m)    Wt 146 lb 9.6 oz  (66.5 kg)    SpO2 99%    BMI 25.97 kg/m    Wt Readings from Last 3 Encounters:  11/11/18 146 lb 9.6 oz (66.5 kg)  11/10/18 143 lb 12.8 oz (65.2 kg)  08/17/17 172 lb (78 kg)    General: Well nourished, well developed, in no acute distress Heart: Atraumatic, normal size  Eyes: PEERLA, EOMI  Neck: Supple, no JVD Endocrine: No thryomegaly Cardiac: No murmurs rubs or gallops, irregular rhythm, PVCs noted Lungs: Clear  to auscultation bilaterally, no wheezing, rhonchi or rales  Abd: Soft, nontender, no hepatomegaly  Ext: No edema, pulses 2+ Musculoskeletal: No deformities, BUE and BLE strength normal and equal Skin: Warm and dry, no rashes   Neuro: Alert and oriented to person, place, time, and situation, CNII-XII grossly intact, no focal deficits  Psych: Normal mood and affect   ASSESSMENT:   NAME@ is a 73 y.o. female who presents for the following: 1. Palpitations   2. Bradycardia   3. PVC (premature ventricular contraction)   4. Essential hypertension   5. Mixed hyperlipidemia     PLAN:   1. Palpitations 2. Bradycardia 3. PVC (premature ventricular contraction) -She reports daily flutterings in her chest as well as palpitations since her coronavirus diagnosis.  She reports they are worse with exertion.  There is also a history of bradycardia.  I highly suspect that her monitor at work just showed a low pulse rate due to the irregular rhythm of her frequent PVCs.  -Really unclear why she is now developed this.  We will proceed with an echocardiogram as well as a 3-day ZIO patch to determine her PVC burden.  If her echo looks normal, we likely will start a beta-blocker or calcium channel blocker pending approval by her pulmonologist.  Little reluctant to start a beta-blocker given her asthma, but we could start a calcium channel blocker. -I think she needs time to recover from coronavirus as well, we will hold on any coronary evaluation at this time  4. Essential  hypertension -Continue current medications  5. Mixed hyperlipidemia -We will discuss her next visit   Disposition: Return in about 3 months (around 02/11/2019).  Medication Adjustments/Labs and Tests Ordered: Current medicines are reviewed at length with the patient today.  Concerns regarding medicines are outlined above.  Orders Placed This Encounter  Procedures   LONG TERM MONITOR (3-14 DAYS)   EKG 12-Lead   No orders of the defined types were placed in this encounter.   Patient Instructions  Medication Instructions:  Your physician recommends that you continue on your current medications as directed. Please refer to the Current Medication list given to you today.  If you need a refill on your cardiac medications before your next appointment, please call your pharmacy.    Testing/Procedures: Your provider has ordered a ZIO monitor. You will wear this for 3 days.   1. Avoid showering during the first 24 hours of wearing the monitor. 2. Avoid excessive sweating to help maximize wear time. 3. Do not submerge the device, no hot tubs, and no swimming pools. 4. Keep any lotions or oils away from the patch. 5. After 24 hours you may shower with the patch on. Take brief showers with your back facing the shower head.  6. Do not remove patch once it has been placed because that will interrupt data and decrease adhesive wear time. 7. Push the button when you have any symptoms and write down what you were feeling. 8. Once you have completed wearing your monitor, remove and place into box which has postage paid and place in your outgoing mailbox.  9. If for some reason you have misplaced your box then call our office and we can provide another box and/or mail it off for you.   Follow-Up: -- follow up in 3 months with Dr. Audie Box      Signed, Addison Naegeli. Audie Box, [place]  [address], [place], [place] [address] [phone]  11/11/2018 4:02 PM

## 2018-11-10 NOTE — Progress Notes (Signed)
Subjective:   PATIENT ID: [name] GENDER: female DOB: 1945/07/22, MRN: [id number]   HPI  Chief Complaint  Patient presents with  . Follow-up   Reason for Visit: Follow-up   Ms. Floe Pruette is a 73 year old female with severe persistent asthma who presents for follow-up.  She was previously seen by Dr. Lake Bells for management of her asthma. She reports her asthma is overall well-controlled on her Dulera, Qvar and Xolair injections. She rarely uses her albuterol inhaler.  She denies shortness of breath, wheezing or cough except at the beginning of September. She reports positive COVID test on September 4th with symptoms resolving within a week. She never required oxygen or hospitalization. However in mid-September around two weeks later, she started having episodes fluttering/palpitations associated with a transient "fullness in her chest" that would cause her to stop and sit due to fatigue and shortness of breath. Last week, she saw that her heart rate was in the 30s on her smart watch. Her heart rate is reported to be usually in the 50s and 60s. The episodes would seem to occur while exerting herself and usually self-resolve. She self-discontinued irbesartan, believing this was the cause of her heart rate.  I have personally reviewed patient's past medical/family/social history, allergies, current medications.  Past Medical History:  Diagnosis Date  . Asthma   . Colon polyps   . DM type 2 (diabetes mellitus, type 2) ([place]) 2015   . Hyperlipidemia   . Hypertension   . Obesity   . 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 Tubular adenoma      Family History  Problem Relation Age of Onset  . Asthma Unknown        all children  . Diabetes Maternal Grandfather   . Diabetes Maternal Grandmother   . Diabetes Brother   . Heart disease Mother   . Heart disease Brother   . Heart disease Father      Social History   Occupational History  . Occupation: Programmer, multimedia: [employer]  Tobacco Use  . Smoking status: Former Smoker    Packs/day: 0.30    Years: 10.00    Pack years: 3.00    Types: Cigarettes    Start date: 51    Quit date: 02/03/1975    Years since quitting: 43.7  . Smokeless tobacco: Never Used  Substance and Sexual Activity  . Alcohol use: No  . Drug use: No  . Sexual activity: Not on file    No Known Allergies   Outpatient Medications Prior to Visit  Medication Sig Dispense Refill  . albuterol (PROVENTIL) (2.5 MG/3ML) 0.083% nebulizer solution USE 1 VIAL IN NEBULIZER EVERY 6 HOURS AS NEEDED FOR WHEEZING. 75 mL 1  . atorvastatin (LIPITOR) 20 MG tablet Take 20 mg by mouth daily.    . Denosumab (PROLIA [place]) Inject into the skin every 6 (six) months.    . hydrochlorothiazide (MICROZIDE) 12.5 MG capsule Take 12.5 mg by mouth daily.    . irbesartan (AVAPRO) 300 MG tablet Take 300 mg by mouth daily.     . metFORMIN (GLUCOPHAGE-XR) 500 MG 24 hr tablet Take 1 tablet by mouth daily.    . mometasone-formoterol (DULERA) 200-5 MCG/ACT AERO INHALE 2 PUFFS BY MOUTH 2 TIMES DAILY. 39 g 1  . montelukast (SINGULAIR) 10 MG tablet TAKE 1 TABLET (10 MG TOTAL) BY MOUTH DAILY. 90 tablet 3  . omalizumab (XOLAIR) 150 MG/ML prefilled syringe Inject 300 mg into  the skin every 14 (fourteen) days. 4 mL 11  . VENTOLIN HFA 108 (90 Base) MCG/ACT inhaler INHALE 1-2 PUFFS BY MOUTH 4 TIMES DAILY AS NEEDED 54 g 0  . Vitamin D, Ergocalciferol, (DRISDOL) 50000 UNITS CAPS Take 50,000 Units by mouth. Twice weekly    . predniSONE (DELTASONE) 10 MG tablet 4 tabs for 2 days, then 3 tabs for 2 days, 2 tabs for 2 days, then 1 tab for 2 days, then stop 20 tablet 0  . QVAR REDIHALER 80 MCG/ACT inhaler INHALE 2 PUFFS BY MOUTH 2 (TWO) TIMES DAILY. 31.8 g 1  . omalizumab (XOLAIR) 150 MG injection INJECT 300 MG INTO THE SKIN EVERY 2 WEEKS 4 each 5   Facility-Administered Medications Prior to Visit  Medication Dose Route Frequency Provider Last Rate Last Dose  . omalizumab Arvid Right)  injection 300 mg  300 mg Subcutaneous Q14 Days Simonne Maffucci B, MD   300 mg at 09/07/18 1417    Review of Systems  Constitutional: Negative for chills, diaphoresis, fever, malaise/fatigue and weight loss.  HENT: Negative for congestion, ear pain and sore throat.   Respiratory: Positive for shortness of breath. Negative for cough, hemoptysis, sputum production and wheezing.   Cardiovascular: Positive for chest pain and palpitations. Negative for leg swelling.  Gastrointestinal: Negative for abdominal pain, heartburn and nausea.  Genitourinary: Negative for frequency.  Musculoskeletal: Negative for joint pain and myalgias.  Skin: Negative for itching and rash.  Neurological: Positive for dizziness. Negative for weakness and headaches.  Endo/Heme/Allergies: Does not bruise/bleed easily.  Psychiatric/Behavioral: Negative for depression. The patient is not nervous/anxious.      Objective:   Vitals:   11/10/18 1055 11/10/18 1056  BP:  (!) 144/72  Pulse:  (!) 56  Temp: (!) 97 F (36.1 C)   TempSrc: Temporal   SpO2:  100%  Weight: 143 lb 12.8 oz (65.2 kg)   Height: 5\' 4"  (1.626 m)    SpO2: 100 % O2 Device: None (Room air)  Physical Exam: General: Well-appearing, no acute distress HENT: [place], AT Eyes: EOMI, no scleral icterus Respiratory: Clear to auscultation bilaterally.  No crackles, wheezing or rales Cardiovascular: Mild bradycardia, -M/R/G, no JVD GI: BS+, soft, nontender Extremities:-Edema,-tenderness Neuro: AAO x4, CNII-XII grossly intact Skin: Intact, no rashes or bruising Psych: Normal mood, normal affect  Data Reviewed:  Imaging: HRCT 04/02/16 - air trapping present on expiration, mild cylindrical bronchiectasis and scattered sub-centimeter pulmonary nodules < 4 mm that are unchanged  PFT: None on file  Labs: CBC    Component Value Date/Time   WBC 9.5 05/10/2012 1358   RBC 4.38 05/10/2012 1358   HGB 13.5 05/10/2012 1358   HCT 38.6 05/10/2012 1358   PLT  300 05/10/2012 1358   MCV 88.1 05/10/2012 1358   MCH 30.8 05/10/2012 1358   MCHC 35.0 05/10/2012 1358   RDW 13.1 05/10/2012 1358   BMET    Component Value Date/Time   NA 143 02/27/2008 1205   K 3.9 02/27/2008 1205   CL 107 02/27/2008 1205   CO2 31 02/27/2008 1205   GLUCOSE 135 (H) 02/27/2008 1205   BUN 11 02/27/2008 1205   CREATININE 0.7 02/27/2008 1205   CALCIUM 9.6 02/27/2008 1205   GFRNONAA 90 02/27/2008 1205   GFRAA 108 02/27/2008 1205   Imaging, labs and tests noted above have been reviewed independently by me.    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 She has had recent diagnosis of COVID followed by episodes of symptomatic bradycardia. In-office she is hemodynamically stable with HR in the upper 50s which is baseline for her. However, when she has bradycardia she has associated chest tightness, dyspnea and fatigue. 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 Reviewed patient's symptoms with Cardiology who will arrange for urgent consult tomorrow.  Severe Persistent Asthma - well-controlled Will send prior authorization for [facility] Please continue your current inhaler regimen of Dulera and QVAR  Symptomatic bradycardia Refer to Cardiology. Scheduled to see Dr. Audie Box on 10/9 for evaluation Obtain echocardiogram  Health Maintenance Immunization History  Administered Date(s) Administered  . Influenza Split 11/02/2012, 11/05/2014, 10/04/2015  . Influenza Whole 10/13/2007, 11/03/2010, 11/06/2011  . Influenza, High Dose Seasonal PF 11/02/2016  . Influenza-Unspecified 11/02/2013  . Pneumococcal Conjugate-13 11/02/2012  . Pneumococcal Polysaccharide-23 02/03/2008    Orders Placed This Encounter  Procedures  . Ambulatory referral to  Cardiology    Referral Priority:   Routine    Referral Type:   Consultation    Referral Reason:   Specialty Services Required    Requested Specialty:   Cardiology    Number of Visits Requested:   1  . ECHOCARDIOGRAM COMPLETE    Standing Status:   Future    Standing Expiration Date:   02/10/2020    Order Specific Question:   Where should this test be performed    Answer:   [facility])    Order Specific Question:   Does the patient weigh less than or greater than 250 lbs?    Answer:   Patient weighs less than 250 lbs    Order Specific Question:   Perflutren DEFINITY (image enhancing agent) should be administered unless hypersensitivity or allergy exist    Answer:   Administer Perflutren    Order Specific Question:   Reason for exam-Echo    Answer:   Other-Full Diagnosis List    Order Specific Question:   Full ICD-10/Reason for Exam    Answer:   Bradycardia [id number]   Meds ordered this encounter  Medications  . beclomethasone (QVAR REDIHALER) 80 MCG/ACT inhaler    Sig: INHALE 2 PUFFS BY MOUTH 2 (TWO) TIMES DAILY.    Dispense:  31.8 g    Refill:  6    Return in about 3 months (around 02/10/2019).  [place], MD [facility] 11/10/2018 7:38 PM  Office Number [phone]

## 2018-11-10 NOTE — Patient Instructions (Addendum)
Severe Persistent Asthma - well-controlled Will send prior authorization for [facility] Please continue your current inhaler regimen of Dulera and QVAR  Symptomatic bradycardia Refer to Cardiology  Obtain echocardiogram  Follow-up with in 3 months

## 2018-11-11 ENCOUNTER — Ambulatory Visit: Payer: 59 | Admitting: Cardiovascular Disease

## 2018-11-11 ENCOUNTER — Encounter: Payer: Self-pay | Admitting: Cardiovascular Disease

## 2018-11-11 VITALS — BP 170/74 | HR 71 | Temp 97.8°F | Ht 63.0 in | Wt 146.6 lb

## 2018-11-11 DIAGNOSIS — E782 Mixed hyperlipidemia: Secondary | ICD-10-CM

## 2018-11-11 DIAGNOSIS — I493 Ventricular premature depolarization: Secondary | ICD-10-CM

## 2018-11-11 DIAGNOSIS — R002 Palpitations: Secondary | ICD-10-CM

## 2018-11-11 DIAGNOSIS — I10 Essential (primary) hypertension: Secondary | ICD-10-CM | POA: Diagnosis not present

## 2018-11-11 DIAGNOSIS — R001 Bradycardia, unspecified: Secondary | ICD-10-CM | POA: Diagnosis not present

## 2018-11-11 NOTE — Progress Notes (Signed)
PA request received from [employer] per patient Drug requested: Dulera 200 CMM Key: [id number] PA request has been sent to plan, and a determination is expected within  days.   Routing to myself for follow-up.

## 2018-11-11 NOTE — Patient Instructions (Signed)
Medication Instructions:  Your physician recommends that you continue on your current medications as directed. Please refer to the Current Medication list given to you today.  If you need a refill on your cardiac medications before your next appointment, please call your pharmacy.    Testing/Procedures: Your provider has ordered a ZIO monitor. You will wear this for 3 days.   1. Avoid showering during the first 24 hours of wearing the monitor. 2. Avoid excessive sweating to help maximize wear time. 3. Do not submerge the device, no hot tubs, and no swimming pools. 4. Keep any lotions or oils away from the patch. 5. After 24 hours you may shower with the patch on. Take brief showers with your back facing the shower head.  6. Do not remove patch once it has been placed because that will interrupt data and decrease adhesive wear time. 7. Push the button when you have any symptoms and write down what you were feeling. 8. Once you have completed wearing your monitor, remove and place into box which has postage paid and place in your outgoing mailbox.  9. If for some reason you have misplaced your box then call our office and we can provide another box and/or mail it off for you.   Follow-Up: -- follow up in 3 months with Dr. Audie Box

## 2018-11-14 ENCOUNTER — Telehealth: Payer: Self-pay | Admitting: Pulmonary Disease

## 2018-11-14 ENCOUNTER — Other Ambulatory Visit (HOSPITAL_COMMUNITY): Payer: 59

## 2018-11-14 ENCOUNTER — Emergency Department (HOSPITAL_COMMUNITY)
Admission: EM | Admit: 2018-11-14 | Discharge: 2018-11-14 | Disposition: A | Discharge status: Home / Self Care | Payer: 59 | Attending: Emergency Medicine | Admitting: Emergency Medicine

## 2018-11-14 ENCOUNTER — Emergency Department (HOSPITAL_COMMUNITY): Payer: 59

## 2018-11-14 ENCOUNTER — Other Ambulatory Visit: Payer: Self-pay

## 2018-11-14 ENCOUNTER — Encounter (HOSPITAL_COMMUNITY): Payer: Self-pay | Admitting: Emergency Medicine

## 2018-11-14 DIAGNOSIS — E119 Type 2 diabetes mellitus without complications: Secondary | ICD-10-CM | POA: Insufficient documentation

## 2018-11-14 DIAGNOSIS — Z87891 Personal history of nicotine dependence: Secondary | ICD-10-CM | POA: Diagnosis not present

## 2018-11-14 DIAGNOSIS — R0602 Shortness of breath: Secondary | ICD-10-CM | POA: Diagnosis not present

## 2018-11-14 DIAGNOSIS — Z79899 Other long term (current) drug therapy: Secondary | ICD-10-CM | POA: Diagnosis not present

## 2018-11-14 DIAGNOSIS — J45909 Unspecified asthma, uncomplicated: Secondary | ICD-10-CM | POA: Insufficient documentation

## 2018-11-14 DIAGNOSIS — R911 Solitary pulmonary nodule: Secondary | ICD-10-CM | POA: Insufficient documentation

## 2018-11-14 DIAGNOSIS — I10 Essential (primary) hypertension: Secondary | ICD-10-CM | POA: Diagnosis not present

## 2018-11-14 DIAGNOSIS — I2699 Other pulmonary embolism without acute cor pulmonale: Secondary | ICD-10-CM | POA: Diagnosis not present

## 2018-11-14 DIAGNOSIS — I498 Other specified cardiac arrhythmias: Secondary | ICD-10-CM | POA: Diagnosis not present

## 2018-11-14 DIAGNOSIS — R002 Palpitations: Secondary | ICD-10-CM | POA: Diagnosis not present

## 2018-11-14 DIAGNOSIS — R008 Other abnormalities of heart beat: Secondary | ICD-10-CM | POA: Diagnosis not present

## 2018-11-14 LAB — BRAIN NATRIURETIC PEPTIDE: B Natriuretic Peptide: 207.2 pg/mL — ABNORMAL HIGH (ref 0.0–100.0)

## 2018-11-14 LAB — CBC WITH DIFFERENTIAL/PLATELET
Abs Immature Granulocytes: 0.01 10*3/uL (ref 0.00–0.07)
Basophils Absolute: 0.1 10*3/uL (ref 0.0–0.1)
Basophils Relative: 1 %
Eosinophils Absolute: 0.2 10*3/uL (ref 0.0–0.5)
Eosinophils Relative: 3 %
HCT: 38.3 % (ref 36.0–46.0)
Hemoglobin: 12.6 g/dL (ref 12.0–15.0)
Immature Granulocytes: 0 %
Lymphocytes Relative: 28 %
Lymphs Abs: 2.1 10*3/uL (ref 0.7–4.0)
MCH: 31 pg (ref 26.0–34.0)
MCHC: 32.9 g/dL (ref 30.0–36.0)
MCV: 94.3 fL (ref 80.0–100.0)
Monocytes Absolute: 0.6 10*3/uL (ref 0.1–1.0)
Monocytes Relative: 8 %
Neutro Abs: 4.5 10*3/uL (ref 1.7–7.7)
Neutrophils Relative %: 60 %
Platelets: 330 10*3/uL (ref 150–400)
RBC: 4.06 MIL/uL (ref 3.87–5.11)
RDW: 13.2 % (ref 11.5–15.5)
WBC: 7.4 10*3/uL (ref 4.0–10.5)
nRBC: 0 % (ref 0.0–0.2)

## 2018-11-14 LAB — BASIC METABOLIC PANEL
Anion gap: 10 (ref 5–15)
BUN: 22 mg/dL (ref 8–23)
CO2: 26 mmol/L (ref 22–32)
Calcium: 9.9 mg/dL (ref 8.9–10.3)
Chloride: 105 mmol/L (ref 98–111)
Creatinine, Ser: 1.27 mg/dL — ABNORMAL HIGH (ref 0.44–1.00)
GFR calc Af Amer: 48 mL/min — ABNORMAL LOW (ref >60)
GFR calc non Af Amer: 41 mL/min — ABNORMAL LOW (ref >60)
Glucose, Bld: 102 mg/dL — ABNORMAL HIGH (ref 70–99)
Potassium: 4 mmol/L (ref 3.5–5.1)
Sodium: 141 mmol/L (ref 135–145)

## 2018-11-14 LAB — TROPONIN I (HIGH SENSITIVITY)
Troponin I (High Sensitivity): 5 ng/L (ref <18)
Troponin I (High Sensitivity): 5 ng/L (ref <18)

## 2018-11-14 LAB — D-DIMER, QUANTITATIVE: D-Dimer, Quant: 0.84 ug/mL-FEU — ABNORMAL HIGH (ref 0.00–0.50)

## 2018-11-14 LAB — TSH: TSH: 2.21 u[IU]/mL (ref 0.350–4.500)

## 2018-11-14 MED ORDER — IOHEXOL 350 MG/ML SOLN
60.0000 mL | Freq: Once | INTRAVENOUS | Status: AC | PRN
  Administered 2018-11-14: 60 mL via INTRAVENOUS

## 2018-11-14 MED ORDER — APIXABAN 5 MG PO TABS: ORAL_TABLET | ORAL | 0 refills | Status: DC

## 2018-11-14 MED ORDER — APIXABAN 5 MG PO TABS: 5.0000 mg | ORAL_TABLET | Freq: Two times a day (BID) | ORAL | Status: DC

## 2018-11-14 MED ORDER — APIXABAN 5 MG PO TABS
10.0000 mg | ORAL_TABLET | Freq: Two times a day (BID) | ORAL | Status: DC
  Administered 2018-11-14: 10 mg via ORAL
  Filled 2018-11-14: qty 2

## 2018-11-14 NOTE — ED Notes (Signed)
Chest Xray clicked off on accident needs to be completed. Thanks

## 2018-11-14 NOTE — Progress Notes (Signed)
ANTICOAGULATION CONSULT NOTE - Initial Consult  Pharmacy Consult for Eliquis Indication: pulmonary embolus   Patient Measurements: Height: 5\' 3"  (160 cm) Weight: 141 lb (64 kg) IBW/kg (Calculated) : 52.4   Vital Signs: Temp: 98.1 F (36.7 C) (10/12 1231) Temp Source: Oral (10/12 1231) BP: 141/54 (10/12 1851) Pulse Rate: 66 (10/12 1851)  Labs: Recent Labs    11/14/18 1501 11/14/18 1739  HGB 12.6  --   HCT 38.3  --   PLT 330  --   CREATININE 1.27*  --   TROPONINIHS 5 5    Estimated Creatinine Clearance: 34.4 mL/min (A) (by C-G formula based on SCr of 1.27 mg/dL (H)).   Medical History: Past Medical History:  Diagnosis Date  . Asthma   . Colon polyps   . DM type 2 (diabetes mellitus, type 2) ([place]) 2015   . Hyperlipidemia   . Hypertension   . Obesity   . Tubular adenoma      Assessment: 73 yo female admitted with palpitations and SOB. She was sent to the ED by outpatient MD. CTA shows multiple subsegmental PEs. Patient opted for outpatient treatment. I discussed use of Eliquis with her in detail.   Goal of Therapy:  Monitor platelets by anticoagulation protocol: Yes    Plan:  -Eliquis 10 mg po BID x 7 days then 5 mg po bid -Monitor s/sx bleeding -Education completed with patient    Harvel Quale 11/14/2018,7:36 PM

## 2018-11-14 NOTE — ED Triage Notes (Signed)
Pt reports SOB, palpitations for the last 2 weeks. Denies recent fever. States recovered from [place] back in Sept. VSS. NAD at present.

## 2018-11-14 NOTE — ED Provider Notes (Signed)
  Physical Exam  BP (!) 141/54 (BP Location: Right Arm)   Pulse 66   Temp 98.1 F (36.7 C) (Oral)   Resp 20   Ht 5\' 3"  (1.6 m)   Wt 64 kg   SpO2 99%   BMI 24.98 kg/m   Physical Exam  Gen: appears nontoxic CV: not tachycardica Pulm: in no respiratory distress. No tachypnea. SpO2 stable  ED Course/Procedures     Procedures  MDM   Pt signed out to me by L Layden, PA-C. Please see previous notes for further history.   In brief, pt presenting for evaluation of palpitations, SOB, and lightheadedness. Recent COVID dx in 10/2018. Initial w/u reassuring. Pt seen by Rafael Bihari, sent to cardiology when episodes of bigeminy were noted. Planned to have an ECHO today, but sxs worsened so instead came to ED. Slight elevation in ddimer, CTA pending.   CTA shows small subsegmental PEs. Ground glass opacities c/w atypical viral changes (liekyl due to cent COVID). Additionally, slight increased size of pulm nodule on diaphragmatic surface, recommend f/u in 3-6 months.  Pt's PESI score 75, low risk for mortality. discussed with pt. Pt elects OP tx with PO anticoagulation. Will consult with cards to ensure they do not feel pt needs admission of echo today, and can f/u closely OP. Case discussed with attending, Dr. Melina Copa agrees to plan.   Discussed with Dr. Paticia Stack from cardiology, who does not feel patient needs admission from a cardiac point of view at this time.  He will inform the morning team that patient will be rescheduling her echo for close follow-up.  Does not feel patient's PEs are related to her bigeminy. Pt given first dose of eliquis in ED. At this time, tp appears safe for d/c. Return precautions given. Pt states she understands and agrees to plan.         Franchot Heidelberg, PA-C 11/14/18 2038    Hayden Rasmussen, MD 11/14/18 [phone]

## 2018-11-14 NOTE — Discharge Instructions (Addendum)
Call your cardiologist tomorrow to reschedule your echo. Take the Eliquis as prescribed. Monitor closely for worsening of symptoms.  If you develop any increased shortness of breath, dizziness, lightheadedness, chest pain, return to the emergency room. Your pulmonary nodule has grown slightly since your previous exam.  Radiology recommends repeat CT in 3 to 6 months for further evaluation. Return to the emergency room with any new, worsening, concerning symptoms.

## 2018-11-14 NOTE — Telephone Encounter (Signed)
Called patient immediately on receipt of this message. She is currently in the ED waiting room. Her palpitations and chest discomfort had become unbearable. I agreed with plan for evaluation. Advised her to keep me updated.

## 2018-11-14 NOTE — ED Provider Notes (Signed)
[facility] EMERGENCY DEPARTMENT Provider Note   CSN: [id number] Arrival date & time: 11/14/18  1215     History   Chief Complaint Chief Complaint  Patient presents with  . Palpitations  . Shortness of Breath    HPI JWAN YELL is a 73 y.o. female with PMH/o HTN, HLD, DM II who presents for evaluation of shortness of breath and palpitations that have been ongoing for last 2 weeks.  Patient feels like over the last few days, the symptoms have become more severe and more frequent.  She reports that she is having multiple episodes almost every hour of shortness of breath and palpitations.  She feels like "her for heart is fluttering."  Patient reports that the shortness of breath is worse with exertion but endorses that she does not have it every time she exerts herself.  No associated chest pain.  Does not have any pleuritic chest pain.  She has history of asthma but states that she is not feeling that she is wheezing does not feel like this is related to her asthma.  She had been seen by her pulmonologist and noted bradycardia with PVCs on her EKG and had been referred to cardiology (Dr. Davina Poke).  She was scheduled to get an echo today for evaluation but felt like her symptoms were coming to severe so she came to the emergency department.  Patient was recently diagnosed with Covid in September 2020.  She has not noted any abdominal pain, nausea/vomiting, fevers, cough, swelling in her legs. She denies any OCP use, recent immobilization, prior history of DVT/PE, recent surgery, leg swelling, or long travel.  She does not smoke.  Has a history of diabetes, hyperlipidemia, hypertension.       The history is provided by the patient.    Past Medical History:  Diagnosis Date  . Asthma   . Colon polyps   . DM type 2 (diabetes mellitus, type 2) ([place]) 2015   . Hyperlipidemia   . Hypertension   . Obesity   . Tubular adenoma     Patient Active Problem List   Diagnosis Date Noted  . Bradycardia 11/10/2018  . Allergic rhinitis 01/18/2017  . Pulmonary nodule 03/08/2015  . DM2 (diabetes mellitus, type 2) ([place]) 07/26/2013  . DYSPHAGIA 12/31/2008  . PERSONAL HX COLONIC POLYPS 12/31/2008  . Severe persistent asthma 04/21/2007  . HYPERLIPIDEMIA 10/24/2006  . HYPERTENSION 10/24/2006    Past Surgical History:  Procedure Laterality Date  . APPENDECTOMY    . CARDIAC CATHETERIZATION    . CATARACT EXTRACTION, BILATERAL  2011  . CHOLECYSTECTOMY    . ESOPHAGOGASTRODUODENOSCOPY  2011  . TONSILLECTOMY    . VESICOVAGINAL FISTULA CLOSURE W/ TAH       OB History   No obstetric history on file.      Home Medications    Prior to Admission medications   Medication Sig Start Date End Date Taking? Authorizing Provider  albuterol (PROVENTIL) (2.5 MG/3ML) 0.083% nebulizer solution USE 1 VIAL IN NEBULIZER EVERY 6 HOURS AS NEEDED FOR WHEEZING. 03/14/18   Juanito Doom, MD  atorvastatin (LIPITOR) 20 MG tablet Take 20 mg by mouth daily.    [provider]  beclomethasone (QVAR REDIHALER) 80 MCG/ACT inhaler INHALE 2 PUFFS BY MOUTH 2 (TWO) TIMES DAILY. 11/10/18   Margaretha Seeds, MD  Denosumab (PROLIA [place]) Inject into the skin every 6 (six) months.    [provider]  hydrochlorothiazide (MICROZIDE) 12.5 MG capsule Take 12.5 mg  by mouth daily.    [provider]  metFORMIN (GLUCOPHAGE-XR) 500 MG 24 hr tablet Take 1 tablet by mouth daily.    [provider]  mometasone-formoterol (DULERA) 200-5 MCG/ACT AERO INHALE 2 PUFFS BY MOUTH 2 TIMES DAILY. 03/14/18   Juanito Doom, MD  montelukast (SINGULAIR) 10 MG tablet TAKE 1 TABLET (10 MG TOTAL) BY MOUTH DAILY. 04/11/18   Juanito Doom, MD  omalizumab Arvid Right) 150 MG/ML prefilled syringe Inject 300 mg into the skin every 14 (fourteen) days. 09/07/18   Juanito Doom, MD  VENTOLIN HFA 108 (90 Base) MCG/ACT inhaler INHALE 1-2 PUFFS BY MOUTH 4 TIMES DAILY AS NEEDED 03/15/18    Parrett, Fonnie Mu, NP  Vitamin D, Ergocalciferol, (DRISDOL) 50000 UNITS CAPS Take 50,000 Units by mouth. Twice weekly    [provider]    Family History Family History  Problem Relation Age of Onset  . Asthma Other        all children  . Diabetes Maternal Grandfather   . Diabetes Maternal Grandmother   . Diabetes Brother   . Heart disease Mother   . Heart disease Brother   . Heart disease Father     Social History Social History   Tobacco Use  . Smoking status: Former Smoker    Packs/day: 0.30    Years: 10.00    Pack years: 3.00    Types: Cigarettes    Start date: 46    Quit date: 02/03/1975    Years since quitting: 43.8  . Smokeless tobacco: Never Used  Substance Use Topics  . Alcohol use: No  . Drug use: No     Allergies   Patient has no known allergies.   Review of Systems Review of Systems  Constitutional: Negative for fever.  Respiratory: Positive for shortness of breath. Negative for cough and wheezing.   Cardiovascular: Positive for palpitations. Negative for chest pain.  Gastrointestinal: Negative for abdominal pain, nausea and vomiting.  Genitourinary: Negative for dysuria and hematuria.  Neurological: Negative for headaches.  All other systems reviewed and are negative.    Physical Exam Updated Vital Signs BP (!) 158/55   Pulse (!) 58   Temp 98.1 F (36.7 C) (Oral)   Resp 17   Ht 5\' 3"  (1.6 m)   Wt 64 kg   SpO2 100%   BMI 24.98 kg/m   Physical Exam Vitals signs and nursing note reviewed.  Constitutional:      Appearance: Normal appearance. She is well-developed.  HENT:     Head: Normocephalic and atraumatic.  Eyes:     General: Lids are normal.     Conjunctiva/sclera: Conjunctivae normal.     Pupils: Pupils are equal, round, and reactive to light.  Neck:     Musculoskeletal: Full passive range of motion without pain.  Cardiovascular:     Rate and Rhythm: Regular rhythm. Bradycardia present.     Pulses: Normal pulses.           Radial pulses are 2+ on the right side and 2+ on the left side.       Dorsalis pedis pulses are 2+ on the right side and 2+ on the left side.     Heart sounds: Normal heart sounds. No murmur. No friction rub. No gallop.   Pulmonary:     Effort: Tachypnea present.     Breath sounds: Normal breath sounds. No rales.     Comments: Does appear to have some labored breathing.  No evidence  of wheezing noted on exam.  She is able to speak in full sentences. Abdominal:     Palpations: Abdomen is soft. Abdomen is not rigid.     Tenderness: There is no abdominal tenderness. There is no guarding.  Musculoskeletal: Normal range of motion.     Comments: Bilateral lower extremities are symmetric in appearance without any overlying warmth, erythema, edema.  Skin:    General: Skin is warm and dry.     Capillary Refill: Capillary refill takes less than 2 seconds.  Neurological:     Mental Status: She is alert and oriented to person, place, and time.  Psychiatric:        Speech: Speech normal.      ED Treatments / Results  Labs (all labs ordered are listed, but only abnormal results are displayed) Labs Reviewed  BASIC METABOLIC PANEL - Abnormal; Notable for the following components:      Result Value   Glucose, Bld 102 (*)    Creatinine, Ser 1.27 (*)    GFR calc non Af Amer 41 (*)    GFR calc Af Amer 48 (*)    All other components within normal limits  D-DIMER, QUANTITATIVE (NOT AT [facility]) - Abnormal; Notable for the following components:   D-Dimer, Quant 0.84 (*)    All other components within normal limits  BRAIN NATRIURETIC PEPTIDE - Abnormal; Notable for the following components:   B Natriuretic Peptide 207.2 (*)    All other components within normal limits  CBC WITH DIFFERENTIAL/PLATELET  TROPONIN I (HIGH SENSITIVITY)  TROPONIN I (HIGH SENSITIVITY)    EKG EKG Interpretation  Date/Time:  Monday November 14 2018 13:51:28 EDT Ventricular Rate:  63 PR Interval:    QRS Duration:  78 QT Interval:  384 QTC Calculation: 393 R Axis:   42 Text Interpretation:  Sinus rhythm Ventricular trigeminy Borderline T abnormalities, diffuse leads Confirmed by Elnora Morrison [phone]) on 11/14/2018 4:03:44 PM   Radiology Dg Chest 2 View  Result Date: 11/14/2018 CLINICAL DATA:  Short of breath EXAM: CHEST - 2 VIEW COMPARISON:  None. FINDINGS: Normal mediastinum and cardiac silhouette. Normal pulmonary vasculature. No evidence of effusion, infiltrate, or pneumothorax. No acute bony abnormality. IMPRESSION: No acute cardiopulmonary process. Electronically Signed   By: Suzy Bouchard M.D.   On: 11/14/2018 13:58    Procedures Procedures (including critical care time)  Medications Ordered in ED Medications - No data to display   Initial Impression / Assessment and Plan / ED Course  I have reviewed the triage vital signs and the nursing notes.  Pertinent labs & imaging results that were available during my care of the patient were reviewed by me and considered in my medical decision making (see chart for details).        73 year old female past history of hypertension, lipidemia, diabetes who presents for evaluation of progressively worsening shortness of breath, palpitations.  Recently diagnosed with Covid in September 2020.  Was scheduled for echo today for evaluation of symptoms but felt like it was more severe so came to the emergency department.  No associated chest pain.  Initially at arrival, she is afebrile.  She is slightly hypertensive vitals otherwise stable.  She is not hypoxic.  Her heart rate maintained between 50 and 60.  She has not noted any chest pain or any swelling in her legs.  Story sounds atypical for ACS etiology.  Question if this is new onset heart failure versus anemia.  Doubt asthma she does not exhibit any  wheezing and she does not feel it is her typical asthma.  She denies any risk factors for PE but also consideration given dyspnea on exertion.  Plan to  check labs, EKG, chest x-ray.  BNP slightly elevated to a 7.2.  BMP shows BUN of 22, creatinine 1.27.  D-dimer is elevated at 0.84.  Troponin is 5.  CBC without any significant leukocytosis or anemia.  Given elevated D-dimer, will plan for CTA of chest for evaluation of any PE.  Chest x-ray shows no evidence of acute infectious etiology.  Patient signed out to [facility], PA-C with CTA and repeat troponin pending.  Will need cardiology consult to see if she needs to be admitted for echo or if she can get it done on an outpatient basis.  Portions of this note were generated with Lobbyist. Dictation errors may occur despite best attempts at proofreading.   Final Clinical Impressions(s) / ED Diagnoses   Final diagnoses:  Palpitations  Shortness of breath    ED Discharge Orders    None       Volanda Napoleon, PA-C 11/14/18 1624    Elnora Morrison, MD 11/14/18 1655

## 2018-11-14 NOTE — Telephone Encounter (Signed)
Medication name and strength: Qvar 44mcg Provider: [place]: [facility] Patient insurance ID: [id number] Phone: [phone] Fax: [phone]  Was the PA started on CMM?  Yes If yes, please enter the Key: [id number] Timeframe for approval/denial: 1-5 business days

## 2018-11-14 NOTE — ED Notes (Signed)
Discharge instructions discussed with pt. Pt verbalized understanding of medication a and follow up care with cardiologist.  Pt to go home with family

## 2018-11-15 MED ORDER — ELIQUIS 5 MG VTE STARTER PACK: ORAL_TABLET | ORAL | 0 refills | Status: DC

## 2018-11-15 MED FILL — XOLAIR 150 MG/ML SOSY: 150 | 28 days supply | Qty: 4 | Fill #1

## 2018-11-15 MED FILL — ELIQUIS STARTER PACK 5 MG T: 5 | 30 days supply | Qty: 74 | Fill #0

## 2018-11-15 MED FILL — IRBESARTAN 300 MG TABLET: 300 | 90 days supply | Qty: 90 | Fill #1

## 2018-11-16 ENCOUNTER — Telehealth: Payer: Self-pay

## 2018-11-16 NOTE — Telephone Encounter (Signed)
Spoke to pt, went over monitor instructions. Verified address. 3 day ZIO ordered to be mailed to pt's home address.

## 2018-11-17 ENCOUNTER — Other Ambulatory Visit: Payer: Self-pay

## 2018-11-17 ENCOUNTER — Ambulatory Visit (HOSPITAL_COMMUNITY): Payer: 59 | Attending: Cardiovascular Disease

## 2018-11-17 DIAGNOSIS — R001 Bradycardia, unspecified: Secondary | ICD-10-CM | POA: Insufficient documentation

## 2018-11-18 ENCOUNTER — Telehealth: Payer: Self-pay | Admitting: Cardiovascular Disease

## 2018-11-18 DIAGNOSIS — I129 Hypertensive chronic kidney disease with stage 1 through stage 4 chronic kidney disease, or unspecified chronic kidney disease: Secondary | ICD-10-CM | POA: Diagnosis not present

## 2018-11-18 DIAGNOSIS — N1831 Chronic kidney disease, stage 3a: Secondary | ICD-10-CM | POA: Diagnosis not present

## 2018-11-18 DIAGNOSIS — U071 COVID-19: Secondary | ICD-10-CM | POA: Diagnosis not present

## 2018-11-18 DIAGNOSIS — R002 Palpitations: Secondary | ICD-10-CM | POA: Diagnosis not present

## 2018-11-18 DIAGNOSIS — I2699 Other pulmonary embolism without acute cor pulmonale: Secondary | ICD-10-CM | POA: Diagnosis not present

## 2018-11-18 DIAGNOSIS — M21371 Foot drop, right foot: Secondary | ICD-10-CM | POA: Diagnosis not present

## 2018-11-18 DIAGNOSIS — Z23 Encounter for immunization: Secondary | ICD-10-CM | POA: Diagnosis not present

## 2018-11-18 MED FILL — dilTIAZem HCL 30 MG TABS: 30 | 30 days supply | Qty: 60 | Fill #0

## 2018-11-18 NOTE — Progress Notes (Signed)
Pt has reviewed message. 1 mos f/u scheduledu Nothing further needed.

## 2018-11-18 NOTE — Telephone Encounter (Signed)
Anderson Malta from [facility] called wanting to speak to DOD.

## 2018-11-18 NOTE — Telephone Encounter (Signed)
Dr Oval Linsey spoke with NP Joellen Jersey regarding this patient

## 2018-11-20 NOTE — Progress Notes (Signed)
Cardiology Office Note:   Date:  11/21/2018  NAME:  Elaine Luna    MRN: [id number] DOB:  [birth year]   PCP:  Shon Baton, MD  Cardiologist:  Evalina Field, MD   Referring MD: Shon Baton, MD   Chief Complaint  Patient presents with  . Palpitations   History of Present Illness:   Elaine Luna is a 73 y.o. female with a hx of COVID-19 infection, recent diagnosis of pulmonary embolism, palpitations/PVCs who is being seen today for follow-up of her palpitations.  She was recently seen in the emergency room 3 days after her last visit and was diagnosed with a pulmonary embolism.  She was started on diltiazem 30 mg immediate release twice daily by her primary care physician.  She reports she still gets quite short of breath with minimal activity, and this can include talking.  She also reports she is aware of her palpitations and likely underlying PVCs.  She states they can occur anytime during the day, but do occur with exertion.  She also reports they are a little better at night.  She was recently started on Eliquis for her pulmonary embolism.  Her blood pressure little elevated today, but she states that her HCTZ has been held for a few days.  She has restarted her irbesartan.  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  She reports she is looking to retire in the next few months.  Her heart rate was noted to be in the 40-50 range in triage.  However, I listen to her she had frequent PVCs, and I suspect this is just related to the irregular nature of her PVCs and artificially low heart rate being recorded.  Past Medical History: Past Medical History:  Diagnosis Date  . Asthma   . Colon polyps   . DM type 2 (diabetes mellitus, type 2) ([place]) 2015   . Hyperlipidemia   . Hypertension   . Obesity   . Tubular adenoma     Past Surgical History: Past Surgical History:  Procedure Laterality Date  . APPENDECTOMY    . CARDIAC CATHETERIZATION    .  CATARACT EXTRACTION, BILATERAL  2011  . CHOLECYSTECTOMY    . ESOPHAGOGASTRODUODENOSCOPY  2011  . TONSILLECTOMY    . VESICOVAGINAL FISTULA CLOSURE W/ TAH      Current Medications: Current Meds  Medication Sig  . albuterol (PROVENTIL) (2.5 MG/3ML) 0.083% nebulizer solution USE 1 VIAL IN NEBULIZER EVERY 6 HOURS AS NEEDED FOR WHEEZING.  Marland Kitchen apixaban (ELIQUIS) 5 MG TABS tablet Take 10 mg (2 tablets) 2 times a day for the first 7 days.  Take 5 mg (1 tablet) 2 times a day for the remainder of the month.  Marland Kitchen atorvastatin (LIPITOR) 20 MG tablet Take 20 mg by mouth daily.  . beclomethasone (QVAR REDIHALER) 80 MCG/ACT inhaler INHALE 2 PUFFS BY MOUTH 2 (TWO) TIMES DAILY.  Marland Kitchen Denosumab (PROLIA [place]) Inject into the skin every 6 (six) months.  . Eliquis DVT/PE Starter Pack (ELIQUIS STARTER PACK) 5 MG TABS Take as directed on package: start with two-5mg  tablets twice daily for 7 days. On day 8, switch to one-5mg  tablet twice daily.  . hydrochlorothiazide (MICROZIDE) 12.5 MG capsule Take 12.5 mg by mouth daily.  . irbesartan (AVAPRO) 300 MG tablet   . metFORMIN (GLUCOPHAGE-XR) 500 MG 24 hr tablet Take 1 tablet by mouth daily.  . mometasone-formoterol (DULERA) 200-5 MCG/ACT AERO INHALE 2 PUFFS BY MOUTH 2  TIMES DAILY.  . montelukast (SINGULAIR) 10 MG tablet TAKE 1 TABLET (10 MG TOTAL) BY MOUTH DAILY.  Marland Kitchen omalizumab Arvid Right) 150 MG/ML prefilled syringe Inject 300 mg into the skin every 14 (fourteen) days.  . VENTOLIN HFA 108 (90 Base) MCG/ACT inhaler INHALE 1-2 PUFFS BY MOUTH 4 TIMES DAILY AS NEEDED  . Vitamin D, Ergocalciferol, (DRISDOL) 50000 UNITS CAPS Take 50,000 Units by mouth. Twice weekly  . [DISCONTINUED] diltiazem (CARDIZEM) 30 MG tablet Take 30 mg by mouth 2 (two) times daily.   Current Facility-Administered Medications for the 11/21/18 encounter (Office Visit) with Geralynn Rile, MD  Medication  . omalizumab Arvid Right) injection 300 mg     Allergies:    Patient has no known allergies.    Social History: Social History   Socioeconomic History  . Marital status: Widowed    Spouse name: Not on file  . Number of children: 4  . Years of education: Not on file  . Highest education level: Not on file  Occupational History  . Occupation: Programmer, multimedia: [employer]  . Financial resource strain: Not on file  . Food insecurity    Worry: Not on file    Inability: Not on file  . Transportation needs    Medical: Not on file    Non-medical: Not on file  Tobacco Use  . Smoking status: Former Smoker    Packs/day: 0.30    Years: 10.00    Pack years: 3.00    Types: Cigarettes    Start date: 17    Quit date: 02/03/1975    Years since quitting: 43.8  . Smokeless tobacco: Never Used  Substance and Sexual Activity  . Alcohol use: No  . Drug use: No  . Sexual activity: Not on file  Lifestyle  . Physical activity    Days per week: Not on file    Minutes per session: Not on file  . Stress: Not on file  Relationships  . Social Herbalist on phone: Not on file    Gets together: Not on file    Attends religious service: Not on file    Active member of club or organization: Not on file    Attends meetings of clubs or organizations: Not on file    Relationship status: Not on file  Other Topics Concern  . Not on file  Social History Narrative  . Not on file    Family History: The patient's family history includes Asthma in an other family member; Diabetes in her brother, maternal grandfather, and maternal grandmother; Heart disease in her brother, father, and mother.  ROS:   All other ROS reviewed and negative. Pertinent positives noted in the HPI.     EKGs/Labs/Other Studies Reviewed:   The following studies were personally reviewed by me today:  TTE: 11/17/2018  1. Left ventricular ejection fraction, by visual estimation, is 60 to 65%. The left ventricle has normal function. Normal left ventricular size. There is no left ventricular  hypertrophy.  2. Low normal GLS -14.8.  3. Global right ventricle has normal systolic function.The right ventricular size is normal. No increase in right ventricular wall thickness.  4. Left atrial size was normal.  5. Right atrial size was normal.  6. The mitral valve is normal in structure. Mild mitral valve regurgitation. No evidence of mitral stenosis.  7. The tricuspid valve is normal in structure. Tricuspid valve regurgitation was not visualized by color flow Doppler.  8. The aortic valve is normal in structure. Aortic valve regurgitation was not visualized by color flow Doppler. Structurally normal aortic valve, with no evidence of sclerosis or stenosis.  9. The pulmonic valve was normal in structure. Pulmonic valve regurgitation is not visualized by color flow Doppler. 10. Normal pulmonary artery systolic pressure. 11. The inferior vena cava is normal in size with greater than 50% respiratory variability, suggesting right atrial pressure of 3 mmHg.  Recent Labs: 11/14/2018: B Natriuretic Peptide 207.2; BUN 22; Creatinine, Ser 1.27; Hemoglobin 12.6; Platelets 330; Potassium 4.0; Sodium 141; TSH 2.210   Recent Lipid Panel No results found for: CHOL, TRIG, HDL, CHOLHDL, VLDL, LDLCALC, LDLDIRECT  Physical Exam:   VS:  BP (!) 165/64   Pulse (!) 46   Ht 5\' 3"  (1.6 m)   Wt 142 lb (64.4 kg)   SpO2 100%   BMI 25.15 kg/m    Wt Readings from Last 3 Encounters:  11/21/18 142 lb (64.4 kg)  11/14/18 141 lb (64 kg)  11/11/18 146 lb 9.6 oz (66.5 kg)    General: Well nourished, well developed, in no acute distress Heart: Atraumatic, normal size  Eyes: PEERLA, EOMI  Neck: Supple, no JVD Endocrine: No thryomegaly Cardiac: Irregular rhythm, PVCs noted Lungs: Clear to auscultation bilaterally, no wheezing, rhonchi or rales  Abd: Soft, nontender, no hepatomegaly  Ext: No edema, pulses 2+ Musculoskeletal: No deformities, BUE and BLE strength normal and equal Skin: Warm and dry, no rashes    Neuro: Alert and oriented to person, place, time, and situation, CNII-XII grossly intact, no focal deficits  Psych: Normal mood and affect   ASSESSMENT:   Elaine Luna is a 73 y.o. female who presents for the following: 1. SOB (shortness of breath) on exertion   2. Multiple subsegmental pulmonary emboli without acute cor pulmonale (HCC)   3. PVC (premature ventricular contraction)   4. Essential hypertension    PLAN:   1. SOB (shortness of breath) on exertion 2. Multiple subsegmental pulmonary emboli without acute cor pulmonale (HCC) 3. PVC (premature ventricular contraction) -She continues to remain quite short of breath with exertion and even talking.  I really think this is related to her recent COVID-19 infection, and as well as her very recent diagnosis of multiple pulmonary emboli.  She is on Eliquis and will continue this.  Her PVCs could be worsened by also uncontrolled hypertension.  Systolic blood pressure in the 160s today.  I have instructed her to restart her home HCTZ.  We also will switch her diltiazem to 120 mg extended release tablet to see if this can help with her PVCs.  She is awaiting her Zio patch to determine what her PVC burden is.  Overall, I think this will just take time for her PVCs to resolve as she heals up from her recent COVID-19 pneumonia as well as recently diagnosed pulmonary emboli.  Her echocardiogram was normal, and this is reassuring.  I discussed with her that a stress test would not be prudent at this time given everything that is going on and she just needs time to recover.  4. Essential hypertension -Restart home HCTZ -Continue home irbesartan   Disposition: Return in about 3 months (around 02/21/2019).  Medication Adjustments/Labs and Tests Ordered: Current medicines are reviewed at length with the patient today.  Concerns regarding medicines are outlined above.  No orders of the defined types were placed in this encounter.  Meds ordered  this encounter  Medications  . diltiazem (CARDIZEM  CD) 120 MG 24 hr capsule    Sig: Take 1 capsule (120 mg total) by mouth daily.    Dispense:  90 capsule    Refill:  3    Patient Instructions  Medication Instructions:  Stop Cardizem 30 mg. Start Cardizem CD 120 mg daily. *If you need a refill on your cardiac medications before your next appointment, please call your pharmacy*  Follow-Up: At [facility], you and your health needs are our priority.  As part of our continuing mission to provide you with exceptional heart care, we have created designated Provider Care Teams.  These Care Teams include your primary Cardiologist (physician) and Advanced Practice Providers (APPs -  Physician Assistants and Nurse Practitioners) who all work together to provide you with the care you need, when you need it.  Your next appointment:   3 months  The format for your next appointment:   In Person  Provider:   Eleonore Chiquito, MD       Signed, Addison Naegeli. Audie Box, [place]  [address] [place], [place] [address] [phone]  11/21/2018 8:33 AM

## 2018-11-21 ENCOUNTER — Encounter: Payer: Self-pay | Admitting: Cardiovascular Disease

## 2018-11-21 ENCOUNTER — Other Ambulatory Visit: Payer: Self-pay

## 2018-11-21 ENCOUNTER — Ambulatory Visit: Payer: 59 | Admitting: Cardiovascular Disease

## 2018-11-21 VITALS — BP 165/64 | HR 46 | Ht 63.0 in | Wt 142.0 lb

## 2018-11-21 DIAGNOSIS — I2694 Multiple subsegmental pulmonary emboli without acute cor pulmonale: Secondary | ICD-10-CM

## 2018-11-21 DIAGNOSIS — I493 Ventricular premature depolarization: Secondary | ICD-10-CM | POA: Diagnosis not present

## 2018-11-21 DIAGNOSIS — I10 Essential (primary) hypertension: Secondary | ICD-10-CM

## 2018-11-21 DIAGNOSIS — R0602 Shortness of breath: Secondary | ICD-10-CM | POA: Diagnosis not present

## 2018-11-21 MED ORDER — DILTIAZEM HCL ER COATED BEADS 120 MG PO CP24: 120.0000 mg | ORAL_CAPSULE | Freq: Every day | ORAL | 3 refills | Status: DC

## 2018-11-21 MED FILL — CARTIA XT 120 MG CP24: 120 | 90 days supply | Qty: 90 | Fill #0

## 2018-11-21 NOTE — Patient Instructions (Signed)
Medication Instructions:  Stop Cardizem 30 mg. Start Cardizem CD 120 mg daily. *If you need a refill on your cardiac medications before your next appointment, please call your pharmacy*  Follow-Up: At [facility], you and your health needs are our priority.  As part of our continuing mission to provide you with exceptional heart care, we have created designated Provider Care Teams.  These Care Teams include your primary Cardiologist (physician) and Advanced Practice Providers (APPs -  Physician Assistants and Nurse Practitioners) who all work together to provide you with the care you need, when you need it.  Your next appointment:   3 months  The format for your next appointment:   In Person  Provider:   Eleonore Chiquito, MD

## 2018-11-22 ENCOUNTER — Ambulatory Visit (INDEPENDENT_AMBULATORY_CARE_PROVIDER_SITE_OTHER): Payer: 59

## 2018-11-22 DIAGNOSIS — I493 Ventricular premature depolarization: Secondary | ICD-10-CM

## 2018-11-22 DIAGNOSIS — R002 Palpitations: Secondary | ICD-10-CM | POA: Diagnosis not present

## 2018-11-22 DIAGNOSIS — R001 Bradycardia, unspecified: Secondary | ICD-10-CM | POA: Diagnosis not present

## 2018-11-29 MED FILL — ATORVASTATIN 20 MG TABLET: 20 | 90 days supply | Qty: 90 | Fill #1

## 2018-12-03 DIAGNOSIS — R002 Palpitations: Secondary | ICD-10-CM | POA: Diagnosis not present

## 2018-12-09 MED FILL — VIT D2 1.25 MG (50,000 UNIT: 1.25 MG | 42 days supply | Qty: 12 | Fill #0

## 2018-12-12 ENCOUNTER — Telehealth: Payer: Self-pay | Admitting: Cardiovascular Disease

## 2018-12-12 NOTE — Telephone Encounter (Signed)
Called Elaine Luna and let her know her monitor showed mainly sinus brady and rare ectopy. Seems to be tolerating ditiazem well. She is not having dizziness with activity despite sinus bradycardia. She is still having fluttering in chest, but does not appear to be arrhythmia related.   [place] T. Audie Box, [place]  [address] [place], [place] [address] [phone]  1:09 PM

## 2018-12-13 MED FILL — XOLAIR 150 MG/ML SOSY: 150 | 28 days supply | Qty: 4 | Fill #2

## 2018-12-16 DIAGNOSIS — I129 Hypertensive chronic kidney disease with stage 1 through stage 4 chronic kidney disease, or unspecified chronic kidney disease: Secondary | ICD-10-CM | POA: Diagnosis not present

## 2018-12-16 DIAGNOSIS — N1832 Chronic kidney disease, stage 3b: Secondary | ICD-10-CM | POA: Diagnosis not present

## 2018-12-16 DIAGNOSIS — I2699 Other pulmonary embolism without acute cor pulmonale: Secondary | ICD-10-CM | POA: Diagnosis not present

## 2018-12-16 DIAGNOSIS — R002 Palpitations: Secondary | ICD-10-CM | POA: Diagnosis not present

## 2018-12-19 MED FILL — DULERA 200 MCG/5 MCG INH: 200-5 | 30 days supply | Qty: 13 | Fill #2

## 2018-12-20 MED FILL — VIT D2 1.25 MG (50,000 UNIT: 1.25 MG | 42 days supply | Qty: 12 | Fill #0

## 2018-12-21 ENCOUNTER — Other Ambulatory Visit: Payer: Self-pay

## 2018-12-21 ENCOUNTER — Ambulatory Visit: Payer: 59 | Admitting: Pulmonary Disease

## 2018-12-21 ENCOUNTER — Encounter: Payer: Self-pay | Admitting: Pulmonary Disease

## 2018-12-21 VITALS — BP 132/82 | HR 48 | Temp 97.5°F | Ht 64.0 in | Wt 145.4 lb

## 2018-12-21 DIAGNOSIS — I2699 Other pulmonary embolism without acute cor pulmonale: Secondary | ICD-10-CM | POA: Diagnosis not present

## 2018-12-21 DIAGNOSIS — U071 COVID-19: Secondary | ICD-10-CM | POA: Diagnosis not present

## 2018-12-21 DIAGNOSIS — R001 Bradycardia, unspecified: Secondary | ICD-10-CM | POA: Diagnosis not present

## 2018-12-21 DIAGNOSIS — J4550 Severe persistent asthma, uncomplicated: Secondary | ICD-10-CM | POA: Diagnosis not present

## 2018-12-21 MED ORDER — APIXABAN 5 MG PO TABS: ORAL_TABLET | ORAL | 1 refills | Status: DC

## 2018-12-21 MED FILL — ELIQUIS 5 MG TABLET: 5 | 30 days supply | Qty: 60 | Fill #0

## 2018-12-21 NOTE — Progress Notes (Signed)
Subjective:   PATIENT ID: Elaine Luna GENDER: female DOB: 03-Apr-1945, MRN: [id number]   HPI  Chief Complaint  Patient presents with  . Follow-up    still bradycardic, recent Echo,    Reason for Visit: Follow-up   Ms. Channing Amparan is a 73 year old female with severe persistent asthma, hx of COVID-19 10/2018, bilateral segmental pulmonary emboli who presents for follow-up.  Overall, she reports asthma is well controlled. Denies shortness of breath, wheezing and cough. She has been compliant with her Dulera, Qvar and Xolair (300 mg every 2 weeks). Denies nocturnal awakenings, shortness of breath and no need to use her rescue inhaler during the week. She reports bruising with Eliquis but denies major bleeding. She reports her chest pain from before is much improved and she is feeling less anxious.  ACT: 24  I have personally reviewed patient's past medical/family/social history/allergies/current medications.  Past Medical History:  Diagnosis Date  . Asthma   . Colon polyps   . DM type 2 (diabetes mellitus, type 2) ([place]) 2015   . Hyperlipidemia   . Hypertension   . Obesity   . Tubular adenoma      Family History  Problem Relation Age of Onset  . Asthma Other        all children  . Diabetes Maternal Grandfather   . Diabetes Maternal Grandmother   . Diabetes Brother   . Heart disease Mother   . Heart disease Brother   . Heart disease Father      Social History   Occupational History  . Occupation: Programmer, multimedia: [employer]  Tobacco Use  . Smoking status: Former Smoker    Packs/day: 0.30    Years: 10.00    Pack years: 3.00    Types: Cigarettes    Start date: 39    Quit date: 02/03/1975    Years since quitting: 43.9  . Smokeless tobacco: Never Used  Substance and Sexual Activity  . Alcohol use: No  . Drug use: No  . Sexual activity: Not on file    No Known Allergies   Outpatient Medications Prior to Visit  Medication Sig Dispense Refill  .  albuterol (PROVENTIL) (2.5 MG/3ML) 0.083% nebulizer solution USE 1 VIAL IN NEBULIZER EVERY 6 HOURS AS NEEDED FOR WHEEZING. 75 mL 1  . apixaban (ELIQUIS) 5 MG TABS tablet Take 10 mg (2 tablets) 2 times a day for the first 7 days.  Take 5 mg (1 tablet) 2 times a day for the remainder of the month. 70 tablet 0  . atorvastatin (LIPITOR) 20 MG tablet Take 20 mg by mouth daily.    . beclomethasone (QVAR REDIHALER) 80 MCG/ACT inhaler INHALE 2 PUFFS BY MOUTH 2 (TWO) TIMES DAILY. 31.8 g 6  . Denosumab (PROLIA [place]) Inject into the skin every 6 (six) months.    . diltiazem (CARDIZEM CD) 120 MG 24 hr capsule Take 1 capsule (120 mg total) by mouth daily. 90 capsule 3  . Eliquis DVT/PE Starter Pack (ELIQUIS STARTER PACK) 5 MG TABS Take as directed on package: start with two-5mg  tablets twice daily for 7 days. On day 8, switch to one-5mg  tablet twice daily. 1 each 0  . hydrochlorothiazide (MICROZIDE) 12.5 MG capsule Take 12.5 mg by mouth daily.    . irbesartan (AVAPRO) 300 MG tablet     . metFORMIN (GLUCOPHAGE-XR) 500 MG 24 hr tablet Take 1 tablet by mouth daily.    . mometasone-formoterol (DULERA) 200-5 MCG/ACT AERO  INHALE 2 PUFFS BY MOUTH 2 TIMES DAILY. 39 g 1  . montelukast (SINGULAIR) 10 MG tablet TAKE 1 TABLET (10 MG TOTAL) BY MOUTH DAILY. 90 tablet 3  . omalizumab (XOLAIR) 150 MG/ML prefilled syringe Inject 300 mg into the skin every 14 (fourteen) days. 4 mL 11  . VENTOLIN HFA 108 (90 Base) MCG/ACT inhaler INHALE 1-2 PUFFS BY MOUTH 4 TIMES DAILY AS NEEDED 54 g 0  . Vitamin D, Ergocalciferol, (DRISDOL) 50000 UNITS CAPS Take 50,000 Units by mouth. Twice weekly     Facility-Administered Medications Prior to Visit  Medication Dose Route Frequency Provider Last Rate Last Dose  . omalizumab Arvid Right) injection 300 mg  300 mg Subcutaneous Q14 Days Simonne Maffucci B, MD   300 mg at 09/07/18 1417    Review of Systems  Constitutional: Negative for chills, diaphoresis, fever, malaise/fatigue and weight loss.   HENT: Negative for congestion.   Respiratory: Negative for cough, hemoptysis, sputum production, shortness of breath and wheezing.   Cardiovascular: Negative for chest pain, palpitations and leg swelling.  Psychiatric/Behavioral: The patient is nervous/anxious.      Objective:   Vitals:   12/21/18 1037  BP: 132/82  Pulse: (!) 48  Temp: (!) 97.5 F (36.4 C)  TempSrc: Temporal  SpO2: 99%  Weight: 145 lb 6.4 oz (66 kg)  Height: 5\' 4"  (1.626 m)     Physical Exam: General: Well-appearing, no acute distress HENT: [place], AT Eyes: EOMI, no scleral icterus Respiratory: Clear to auscultation bilaterally.  No crackles, wheezing or rales Cardiovascular: RRR, -M/R/G, no JVD GI: BS+, soft, nontender Extremities:-Edema,-tenderness Neuro: AAO x4, CNII-XII grossly intact Skin: Intact, no rashes or bruising Psych: Normal mood, normal affect  Data Reviewed:  Imaging: HRCT 04/02/16 - air trapping present on expiration, mild cylindrical bronchiectasis and scattered sub-centimeter pulmonary nodules < 4 mm that are unchanged  CTA 11/14/18 - several bilateral tiny pulmonary emboli in segmental branches of RUL, RLL, lingula and LLL. Stable bilateral pulmonary nodules  PFT: None on file  Labs: CBC    Component Value Date/Time   WBC 7.4 11/14/2018 1501   RBC 4.06 11/14/2018 1501   HGB 12.6 11/14/2018 1501   HCT 38.3 11/14/2018 1501   PLT 330 11/14/2018 1501   MCV 94.3 11/14/2018 1501   MCH 31.0 11/14/2018 1501   MCHC 32.9 11/14/2018 1501   RDW 13.2 11/14/2018 1501   LYMPHSABS 2.1 11/14/2018 1501   MONOABS 0.6 11/14/2018 1501   EOSABS 0.2 11/14/2018 1501   BASOSABS 0.1 11/14/2018 1501   BMET    Component Value Date/Time   NA 141 11/14/2018 1501   K 4.0 11/14/2018 1501   CL 105 11/14/2018 1501   CO2 26 11/14/2018 1501   GLUCOSE 102 (H) 11/14/2018 1501   BUN 22 11/14/2018 1501   CREATININE 1.27 (H) 11/14/2018 1501   CALCIUM 9.9 11/14/2018 1501   GFRNONAA 41 (L) 11/14/2018 1501    GFRAA 48 (L) 11/14/2018 1501   Imaging, labs and tests noted above have been reviewed independently by me.    Assessment & Plan:   Discussion: 73 year old female with severe persistent asthma on Xolair, aspergillus IgE antibody moderately + 2017, hx of COVID-19 10/2018, bilateral segmental pulmonary emboli and benign pulmonary nodules who presents for follow-up.  Severe Persistent Asthma - well-controlled CONTINUE Dulera 200-5 mcg 1 puff twice a day CONTINUE Qvar 80 mcg 1 puff twice a day CONTINUE q2weeks Xolair. We will consider de-escalating to q1 month in the springtime (Feb/March)  Bilateral  multiple segmental pulmonary emboli in setting of COVID-19 infection Continue Eliquis (started 10/9) Will re-evaluate in January to discontinue anticoagulation  Benign pulmonary nodules Stable since 2018. No further imaging warranted  Bradycardia Followed by Cardiology with Dr. Audie Box Continue diltiazem as instructed  Health Maintenance Immunization History  Administered Date(s) Administered  . Influenza Split 11/02/2012, 11/05/2014, 10/04/2015  . Influenza Whole 10/13/2007, 11/03/2010, 11/06/2011  . Influenza, High Dose Seasonal PF 11/02/2016  . Influenza-Unspecified 11/02/2013  . Pneumococcal Conjugate-13 11/02/2012  . Pneumococcal Polysaccharide-23 02/03/2008    No orders of the defined types were placed in this encounter.  Meds ordered this encounter  Medications  . apixaban (ELIQUIS) 5 MG TABS tablet    Sig: Take 5 mg (1 tablet) 2 times a day for the remainder of the month.    Dispense:  60 tablet    Refill:  1    Return in about 3 months (around 03/23/2019).  [place], MD [facility] 12/21/2018 9:03 AM  Office Number [phone]

## 2018-12-21 NOTE — Patient Instructions (Addendum)
Severe Persistent Asthma - well-controlled CONTINUE Dulera 200-5 mcg 1 puff twice a day CONTINUE Qvar 80 mcg 1 puff twice a day CONTINUE q2weeks Xolair. We will consider de-escalating to q1 month in the springtime (Feb/March)  Bilateral multiple segmental pulmonary emboli in setting of COVID-19 infection Continue Eliquis (started 10/9) Will re-evaluate in January to discontinue anticoagulation  Bradycardia Followed by Cardiology with Dr. Audie Box Continue diltiazem as instructed  Please follow up in office with Dr. Loanne Drilling January 12,2021

## 2018-12-25 ENCOUNTER — Encounter: Payer: Self-pay | Admitting: Pulmonary Disease

## 2018-12-25 DIAGNOSIS — U071 COVID-19: Secondary | ICD-10-CM | POA: Insufficient documentation

## 2018-12-25 DIAGNOSIS — I2699 Other pulmonary embolism without acute cor pulmonale: Secondary | ICD-10-CM

## 2018-12-25 HISTORY — DX: Other pulmonary embolism without acute cor pulmonale: I26.99

## 2018-12-25 HISTORY — DX: COVID-19: U07.1

## 2019-01-05 MED FILL — SHINGRIX 50 MCG SUS: 50 | 30 days supply | Qty: 1 | Fill #0

## 2019-01-05 MED FILL — ELIQUIS 5 MG TABLET: 5 | 30 days supply | Qty: 60 | Fill #0

## 2019-01-05 MED FILL — VIT D2 1.25 MG (50,000 UNIT: 1.25 MG | 42 days supply | Qty: 12 | Fill #0

## 2019-01-06 DIAGNOSIS — E119 Type 2 diabetes mellitus without complications: Secondary | ICD-10-CM | POA: Diagnosis not present

## 2019-01-06 DIAGNOSIS — Z0000 Encounter for general adult medical examination without abnormal findings: Secondary | ICD-10-CM | POA: Diagnosis not present

## 2019-01-06 DIAGNOSIS — M810 Age-related osteoporosis without current pathological fracture: Secondary | ICD-10-CM | POA: Diagnosis not present

## 2019-01-06 DIAGNOSIS — Z23 Encounter for immunization: Secondary | ICD-10-CM | POA: Diagnosis not present

## 2019-01-09 ENCOUNTER — Other Ambulatory Visit (HOSPITAL_COMMUNITY): Payer: Self-pay | Admitting: *Deleted

## 2019-01-10 ENCOUNTER — Other Ambulatory Visit: Payer: Self-pay

## 2019-01-10 ENCOUNTER — Encounter (HOSPITAL_COMMUNITY)
Admission: RE | Admit: 2019-01-10 | Discharge: 2019-01-10 | Disposition: A | Discharge status: Home / Self Care | Payer: 59 | Source: Ambulatory Visit | Attending: Internal Medicine | Admitting: Internal Medicine

## 2019-01-10 DIAGNOSIS — M810 Age-related osteoporosis without current pathological fracture: Secondary | ICD-10-CM | POA: Diagnosis not present

## 2019-01-10 MED ORDER — DENOSUMAB 60 MG/ML ~~LOC~~ SOSY
PREFILLED_SYRINGE | SUBCUTANEOUS | Status: AC
  Administered 2019-01-10: 60 mg via SUBCUTANEOUS
  Filled 2019-01-10: qty 1

## 2019-01-10 MED ORDER — DENOSUMAB 60 MG/ML ~~LOC~~ SOSY
60.0000 mg | PREFILLED_SYRINGE | Freq: Once | SUBCUTANEOUS | Status: AC
  Administered 2019-01-10: 14:00:00 60 mg via SUBCUTANEOUS

## 2019-01-13 DIAGNOSIS — Z1331 Encounter for screening for depression: Secondary | ICD-10-CM | POA: Diagnosis not present

## 2019-01-13 DIAGNOSIS — Z7901 Long term (current) use of anticoagulants: Secondary | ICD-10-CM | POA: Diagnosis not present

## 2019-01-13 DIAGNOSIS — E1122 Type 2 diabetes mellitus with diabetic chronic kidney disease: Secondary | ICD-10-CM | POA: Diagnosis not present

## 2019-01-13 DIAGNOSIS — R002 Palpitations: Secondary | ICD-10-CM | POA: Diagnosis not present

## 2019-01-13 DIAGNOSIS — E785 Hyperlipidemia, unspecified: Secondary | ICD-10-CM | POA: Diagnosis not present

## 2019-01-13 DIAGNOSIS — Z0000 Encounter for general adult medical examination without abnormal findings: Secondary | ICD-10-CM | POA: Diagnosis not present

## 2019-01-13 DIAGNOSIS — I129 Hypertensive chronic kidney disease with stage 1 through stage 4 chronic kidney disease, or unspecified chronic kidney disease: Secondary | ICD-10-CM | POA: Diagnosis not present

## 2019-01-13 DIAGNOSIS — E669 Obesity, unspecified: Secondary | ICD-10-CM | POA: Diagnosis not present

## 2019-01-13 DIAGNOSIS — J45909 Unspecified asthma, uncomplicated: Secondary | ICD-10-CM | POA: Diagnosis not present

## 2019-01-13 DIAGNOSIS — M21371 Foot drop, right foot: Secondary | ICD-10-CM | POA: Diagnosis not present

## 2019-01-23 MED FILL — MONTELUKAST SOD 10 MG TAB: 10 | 90 days supply | Qty: 90 | Fill #0

## 2019-01-23 MED FILL — METFORMIN HCL ER 500 MG TB2: 500 | 90 days supply | Qty: 90 | Fill #0

## 2019-01-25 MED FILL — XOLAIR 150 MG/ML SOSY: 150 | 28 days supply | Qty: 4 | Fill #3

## 2019-02-02 MED FILL — HYDROCHLOROTHIAZIDE 25 MG T: 25 | 90 days supply | Qty: 90 | Fill #0

## 2019-02-08 ENCOUNTER — Ambulatory Visit (INDEPENDENT_AMBULATORY_CARE_PROVIDER_SITE_OTHER): Payer: 59 | Admitting: Pulmonary Disease

## 2019-02-08 ENCOUNTER — Other Ambulatory Visit: Payer: Self-pay

## 2019-02-08 DIAGNOSIS — R001 Bradycardia, unspecified: Secondary | ICD-10-CM | POA: Diagnosis not present

## 2019-02-08 DIAGNOSIS — Z8616 Personal history of COVID-19: Secondary | ICD-10-CM | POA: Diagnosis not present

## 2019-02-08 DIAGNOSIS — Z7901 Long term (current) use of anticoagulants: Secondary | ICD-10-CM

## 2019-02-08 DIAGNOSIS — R918 Other nonspecific abnormal finding of lung field: Secondary | ICD-10-CM | POA: Diagnosis not present

## 2019-02-08 DIAGNOSIS — J4550 Severe persistent asthma, uncomplicated: Secondary | ICD-10-CM

## 2019-02-08 DIAGNOSIS — I2699 Other pulmonary embolism without acute cor pulmonale: Secondary | ICD-10-CM

## 2019-02-08 DIAGNOSIS — I2694 Multiple subsegmental pulmonary emboli without acute cor pulmonale: Secondary | ICD-10-CM | POA: Diagnosis not present

## 2019-02-08 DIAGNOSIS — Z7951 Long term (current) use of inhaled steroids: Secondary | ICD-10-CM

## 2019-02-08 NOTE — Patient Instructions (Addendum)
Bilateral multiple segmental pulmonary emboli in setting of COVID-19 infection Complete three months of anticoagulation (end date on 02/11/19)  Severe Persistent Asthma - well-controlled CONTINUE Dulera 200-5 mcg 1 puff twice a day CONTINUE Qvar 80 mcg 1 puff twice a day CONTINUE q2weeks Xolair. We will consider de-escalating to q1 month in the springtime (Feb/March)  Follow-up with me in 3 months

## 2019-02-08 NOTE — Progress Notes (Signed)
Virtual Visit via Telephone Note  I connected with Elaine Luna on 02/08/19 at  3:00 PM EST by telephone and verified that I am speaking with the correct person using two identifiers.  Location: Patient: Home Provider: [facility]   I discussed the limitations, risks, security and privacy concerns of performing an evaluation and management service by telephone and the availability of in person appointments. I also discussed with the patient that there may be a patient responsible charge related to this service. The patient expressed understanding and agreed to proceed.   I discussed the assessment and treatment plan with the patient. The patient was provided an opportunity to ask questions and all were answered. The patient agreed with the plan and demonstrated an understanding of the instructions.   The patient was advised to call back or seek an in-person evaluation if the symptoms worsen or if the condition fails to improve as anticipated.  I provided 25 minutes of non-face-to-face time during this encounter.   Gesenia Bantz Rodman Pickle, MD    Subjective:   PATIENT ID: Elaine Luna GENDER: female DOB: [birth year], MRN: [id number]   HPI  Chief Complaint  Patient presents with  . Follow-up    2 month rov- pt states she is doing well at this time.    Reason for Visit: Follow-up   Elaine Luna is a 74 year old female with severe persistent asthma, hx of COVID-19 10/2018, bilateral segmental pulmonary emboli who presents for follow-up.  She has been compliant with her Eliquis since October. She has tolerated it well except for occasional bruising but no bleeding. She has been evaluated Cardiology and monitor demonstrated sinus brady but no evidence atrial fibrillation. She reports improved activity since her recovery from COVID-19 infection. She does still feel palpitations is limiting her activity. She has received her 1st COVID vaccination.   For her  asthma, she reports it is well controlled with no exacerbations since last winter. She is daily Dulera, Qvar and Xolair (300 mg q2 weeks). Denies shortness of breath, wheezing or nocturnal awakenings.  I have personally reviewed patient's past medical/family/social history/allergies/current medications.  Past Medical History:  Diagnosis Date  . Asthma   . Colon polyps   . DM type 2 (diabetes mellitus, type 2) ([place]) 2015   . Hyperlipidemia   . Hypertension   . Obesity   . Tubular adenoma     Outpatient Medications Prior to Visit  Medication Sig Dispense Refill  . apixaban (ELIQUIS) 5 MG TABS tablet Take 5 mg (1 tablet) 2 times a day for the remainder of the month. 60 tablet 1  . atorvastatin (LIPITOR) 20 MG tablet Take 20 mg by mouth daily.    . beclomethasone (QVAR REDIHALER) 80 MCG/ACT inhaler INHALE 2 PUFFS BY MOUTH 2 (TWO) TIMES DAILY. (Patient taking differently: 2 puffs daily. INHALE 2 PUFFS BY MOUTH QD) 31.8 g 6  . Denosumab (PROLIA [place]) Inject into the skin every 6 (six) months.    . diltiazem (CARDIZEM CD) 120 MG 24 hr capsule Take 1 capsule (120 mg total) by mouth daily. 90 capsule 3  . hydrochlorothiazide (MICROZIDE) 12.5 MG capsule Take 12.5 mg by mouth daily.    . irbesartan (AVAPRO) 300 MG tablet Take 300 mg by mouth daily.     . metFORMIN (GLUCOPHAGE-XR) 500 MG 24 hr tablet Take 1 tablet by mouth daily with breakfast. Currently taking only 3 times weekly    . mometasone-formoterol (DULERA) 200-5 MCG/ACT AERO INHALE 2  PUFFS BY MOUTH 2 TIMES DAILY. (Patient taking differently: INHALE 2 PUFFS BY MOUTH QD) 39 g 1  . montelukast (SINGULAIR) 10 MG tablet TAKE 1 TABLET (10 MG TOTAL) BY MOUTH DAILY. 90 tablet 3  . omalizumab (XOLAIR) 150 MG/ML prefilled syringe Inject 300 mg into the skin every 14 (fourteen) days. 4 mL 11  . VENTOLIN HFA 108 (90 Base) MCG/ACT inhaler INHALE 1-2 PUFFS BY MOUTH 4 TIMES DAILY AS NEEDED 54 g 0  . Vitamin D, Ergocalciferol, (DRISDOL) 50000 UNITS CAPS Take  50,000 Units by mouth. Twice weekly    . albuterol (PROVENTIL) (2.5 MG/3ML) 0.083% nebulizer solution USE 1 VIAL IN NEBULIZER EVERY 6 HOURS AS NEEDED FOR WHEEZING. (Patient not taking: Reported on 02/08/2019) 75 mL 1   Facility-Administered Medications Prior to Visit  Medication Dose Route Frequency Provider Last Rate Last Admin  . omalizumab Arvid Right) injection 300 mg  300 mg Subcutaneous Q14 Days Simonne Maffucci B, MD   300 mg at 09/07/18 1417    Review of Systems  Constitutional: Negative for chills, diaphoresis, fever, malaise/fatigue and weight loss.  HENT: Negative for congestion.   Respiratory: Negative for cough, hemoptysis, sputum production, shortness of breath and wheezing.   Cardiovascular: Negative for chest pain, palpitations and leg swelling.     Objective:   There were no vitals filed for this visit.   Physical Exam: No apparent distress on the phone  Data Reviewed:  Imaging: HRCT 04/02/16 - air trapping present on expiration, mild cylindrical bronchiectasis and scattered sub-centimeter pulmonary nodules < 4 mm that are unchanged  CTA 11/14/18 - several bilateral tiny pulmonary emboli in segmental branches of RUL, RLL, lingula and LLL. Stable bilateral pulmonary nodules  PFT: None on file  Imaging, labs and tests noted above have been reviewed independently by me.    Assessment & Plan:   Discussion: 74 year old female with severe persistent asthma on Xolair, aspergillus IgE antibody moderately + in 2017, prior COVID-19 infection, bilateral segmental pulmonary emboli and benign pulmonary nodules who presents follow-up.  Bilateral multiple segmental pulmonary emboli in setting of COVID-19 infection Discussed that there is no current available data to support appropriate anticoagulation duration. Patient has recovered from viral illness, asymptomatic and regularly active. Complete three months of anticoagulation (end date on 02/11/19)  Severe Persistent Asthma -  well-controlled CONTINUE Dulera 200-5 mcg 1 puff twice a day CONTINUE Qvar 80 mcg 1 puff twice a day CONTINUE q2weeks Xolair. We will consider de-escalating to q1 month in the springtime (Feb/March)  Benign pulmonary nodules Stable since 2018. No further imaging warranted  Bradycardia Followed by Cardiology with Dr. Audie Box Continue diltiazem as instructed  Health Maintenance Immunization History  Administered Date(s) Administered  . Influenza Split 11/02/2012, 11/05/2014, 10/04/2015  . Influenza Whole 10/13/2007, 11/03/2010, 11/06/2011  . Influenza, High Dose Seasonal PF 11/02/2016, 12/05/2018  . Influenza-Unspecified 11/02/2013  . Pneumococcal Conjugate-13 11/02/2012  . Pneumococcal Polysaccharide-23 02/03/2008, 12/05/2018  . Zoster Recombinat (Shingrix) 01/05/2019    No orders of the defined types were placed in this encounter.  No orders of the defined types were placed in this encounter.   No follow-ups on file.  [place], MD [facility] 02/08/2019 3:22 PM  Office Number [phone]

## 2019-02-19 NOTE — Progress Notes (Signed)
Cardiology Office Note:   Date:  02/21/2019  NAME:  Elaine Luna    MRN: [id number] DOB:  1945/02/09   PCP:  Shon Baton, MD  Cardiologist:  Evalina Field, MD  Electrophysiologist:  None   Referring MD: Shon Baton, MD   Chief Complaint  Patient presents with  . pvc   History of Present Illness:   Elaine Luna is a 74 y.o. female with a hx of COVID-19, pulmonary embolism, palpitations/PVCs who presents for follow-up.  Recent echocardiogram with normal left ventricular function.  Recent ambulatory ECG without any significant arrhythmias.  She has stopped taking her diltiazem.  Her heart rate was in the low 40s.  She reports that she was getting lightheaded with this and stopped this at the discretion of her pulmonologist.  She reports she still gets episodes of palpitations but they occur 2-3 times per day.  They last seconds.  They mainly occur in the morning.  There is no dietary component to this at all.  No chest pain or trouble breathing with it.  She is doing well and getting over her coronavirus pneumonia.  She is also completed a course of anticoagulation for pulmonary embolism.  She seems to be doing much better.  She is walking 4000 steps without any limitations.  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  She reports she would not like to take medications unless absolutely necessary.  Past Medical History: Past Medical History:  Diagnosis Date  . Asthma   . Colon polyps   . DM type 2 (diabetes mellitus, type 2) ([place]) 2015   . Hyperlipidemia   . Hypertension   . Obesity   . Tubular adenoma     Past Surgical History: Past Surgical History:  Procedure Laterality Date  . APPENDECTOMY    . CARDIAC CATHETERIZATION    . CATARACT EXTRACTION, BILATERAL  2011  . CHOLECYSTECTOMY    . ESOPHAGOGASTRODUODENOSCOPY  2011  . TONSILLECTOMY    . VESICOVAGINAL FISTULA CLOSURE W/ TAH      Current Medications: Current Meds  Medication Sig  . albuterol  (PROVENTIL) (2.5 MG/3ML) 0.083% nebulizer solution USE 1 VIAL IN NEBULIZER EVERY 6 HOURS AS NEEDED FOR WHEEZING.  Marland Kitchen atorvastatin (LIPITOR) 20 MG tablet Take 20 mg by mouth daily.  . beclomethasone (QVAR REDIHALER) 80 MCG/ACT inhaler INHALE 2 PUFFS BY MOUTH 2 (TWO) TIMES DAILY.  Marland Kitchen Denosumab (PROLIA [place]) Inject into the skin every 6 (six) months.  . hydrochlorothiazide (MICROZIDE) 12.5 MG capsule Take 12.5 mg by mouth daily.  . irbesartan (AVAPRO) 300 MG tablet Take 300 mg by mouth daily.   . metFORMIN (GLUCOPHAGE-XR) 500 MG 24 hr tablet Take 1 tablet by mouth daily with breakfast. Currently taking only 3 times weekly  . mometasone-formoterol (DULERA) 200-5 MCG/ACT AERO INHALE 2 PUFFS BY MOUTH 2 TIMES DAILY.  . montelukast (SINGULAIR) 10 MG tablet TAKE 1 TABLET (10 MG TOTAL) BY MOUTH DAILY.  Marland Kitchen omalizumab Arvid Right) 150 MG/ML prefilled syringe Inject 300 mg into the skin every 14 (fourteen) days.  . VENTOLIN HFA 108 (90 Base) MCG/ACT inhaler INHALE 1-2 PUFFS BY MOUTH 4 TIMES DAILY AS NEEDED  . Vitamin D, Ergocalciferol, (DRISDOL) 50000 UNITS CAPS Take 50,000 Units by mouth. Twice weekly   Current Facility-Administered Medications for the 02/21/19 encounter (Office Visit) with Geralynn Rile, MD  Medication  . omalizumab Arvid Right) injection 300 mg     Allergies:    Patient has no known allergies.   Social History:  Social History   Socioeconomic History  . Marital status: Widowed    Spouse name: Not on file  . Number of children: 4  . Years of education: Not on file  . Highest education level: Not on file  Occupational History  . Occupation: Programmer, multimedia: [employer]  Tobacco Use  . Smoking status: Former Smoker    Packs/day: 0.30    Years: 10.00    Pack years: 3.00    Types: Cigarettes    Start date: 13    Quit date: 02/03/1975    Years since quitting: 44.0  . Smokeless tobacco: Never Used  Substance and Sexual Activity  . Alcohol use: No  . Drug use: No  . Sexual  activity: Not on file  Other Topics Concern  . Not on file  Social History Narrative  . Not on file   Social Determinants of Health   Financial Resource Strain:   . Difficulty of Paying Living Expenses: Not on file  Food Insecurity:   . Worried About Charity fundraiser in the Last Year: Not on file  . Ran Out of Food in the Last Year: Not on file  Transportation Needs:   . Lack of Transportation (Medical): Not on file  . Lack of Transportation (Non-Medical): Not on file  Physical Activity:   . Days of Exercise per Week: Not on file  . Minutes of Exercise per Session: Not on file  Stress:   . Feeling of Stress : Not on file  Social Connections:   . Frequency of Communication with Friends and Family: Not on file  . Frequency of Social Gatherings with Friends and Family: Not on file  . Attends Religious Services: Not on file  . Active Member of Clubs or Organizations: Not on file  . Attends Archivist Meetings: Not on file  . Marital Status: Not on file     Family History: The patient's family history includes Asthma in an other family member; Diabetes in her brother, maternal grandfather, and maternal grandmother; Heart disease in her brother, father, and mother.  ROS:   All other ROS reviewed and negative. Pertinent positives noted in the HPI.     EKGs/Labs/Other Studies Reviewed:   The following studies were personally reviewed by me today:  [place] 12/11/2018 Patient had a min HR of 39 bpm (sinus bradycardia), max HR of 164 bpm (supraventricular tachycardia), and avg HR of 49 bpm (sinus bradycardia). Predominant underlying rhythm was Sinus Rhythm. 1 run of non-sustained ventricular tachycardia occurred lasting 5 beats (3.8 second duration) with a max rate of 124 bpm (avg 100 bpm). 6 Supraventricular Tachycardia runs occurred, the run with the fastest interval lasting 10 beats (5.8 second duration) with a max rate of 164 bpm (avg 124 bpm); the run with the fastest  interval was also the longest. Junctional Rhythm was present, likely representing isorhythmic dissociation. Isolated SVEs were occasional (3.4%, 7352), SVE Couplets were rare (<1.0%, 7), and no SVE Triplets were present. Isolated VEs were rare (<1.0%), and no VE Couplets or VE Triplets were present. No atrial fibrillation detected. Patient diary summarized below:  11/25/2018 @ 11:20 AM: symptoms if skipped/irregular beats, fluttering/racing, dizziness while standing coincided with sinus bradycardia ~49-52 bpm. A junctional rhythm (likely isorhythmic dissociation) was present.  11/25/2018 @ 11:50 AM: symptoms of pounding coincided with sinus bradycardia ~46 bpm with occasional junctional rhythm (likely isorhythmic dissociation).  11/25/2018 @ 7:25 PM: symptoms of skipped/irregular beats coincided with sinus bradycardia ~53  bpm and rare PACs/PVCs.   Impression:  1. Predominant rhythm was sinus bradycardia @ 49 bpm.  2. No significant arrhythmias to explain symptoms.  3. Isorhythmic dissociation present (benign finding).  4. Rare PACs and PVCs.   TTE 11/17/2018  1. Left ventricular ejection fraction, by visual estimation, is 60 to 65%. The left ventricle has normal function. Normal left ventricular size. There is no left ventricular hypertrophy.  2. Low normal GLS -14.8.  3. Global right ventricle has normal systolic function.The right ventricular size is normal. No increase in right ventricular wall thickness.  4. Left atrial size was normal.  5. Right atrial size was normal.  6. The mitral valve is normal in structure. Mild mitral valve regurgitation. No evidence of mitral stenosis.  7. The tricuspid valve is normal in structure. Tricuspid valve regurgitation was not visualized by color flow Doppler.  8. The aortic valve is normal in structure. Aortic valve regurgitation was not visualized by color flow Doppler. Structurally normal aortic valve, with no evidence of sclerosis or stenosis.  9.  The pulmonic valve was normal in structure. Pulmonic valve regurgitation is not visualized by color flow Doppler. 10. Normal pulmonary artery systolic pressure. 11. The inferior vena cava is normal in size with greater than 50% respiratory variability, suggesting right atrial pressure of 3 mmHg.  Recent Labs: 11/14/2018: B Natriuretic Peptide 207.2; BUN 22; Creatinine, Ser 1.27; Hemoglobin 12.6; Platelets 330; Potassium 4.0; Sodium 141; TSH 2.210   Recent Lipid Panel No results found for: CHOL, TRIG, HDL, CHOLHDL, VLDL, LDLCALC, LDLDIRECT  Physical Exam:   VS:  BP (!) 144/62   Pulse 60   Ht 5\' 4"  (1.626 m)   Wt 149 lb (67.6 kg)   SpO2 99%   BMI 25.58 kg/m    Wt Readings from Last 3 Encounters:  02/21/19 149 lb (67.6 kg)  12/21/18 145 lb 6.4 oz (66 kg)  11/21/18 142 lb (64.4 kg)    General: Well nourished, well developed, in no acute distress Heart: Atraumatic, normal size  Eyes: PEERLA, EOMI  Neck: Supple, no JVD Endocrine: No thryomegaly Cardiac: Normal S1, S2; RRR; no murmurs, rubs, or gallops Lungs: Clear to auscultation bilaterally, no wheezing, rhonchi or rales  Abd: Soft, nontender, no hepatomegaly  Ext: No edema, pulses 2+ Musculoskeletal: No deformities, BUE and BLE strength normal and equal Skin: Warm and dry, no rashes   Neuro: Alert and oriented to person, place, time, and situation, CNII-XII grossly intact, no focal deficits  Psych: Normal mood and affect   ASSESSMENT:   Elaine Luna is a 74 y.o. female who presents for the following: 1. SOB (shortness of breath) on exertion   2. Sinus bradycardia   3. PVC (premature ventricular contraction)     PLAN:   1. SOB (shortness of breath) on exertion 2. Sinus bradycardia 3. PVC (premature ventricular contraction) -Symptoms overall are improving.  I think it is no longer necessary for her to take diltiazem.  I agree with stopping this medication.  We continue to monitor her symptoms for now.  Should they  become an issue we can consider alternative treatments but for now we will just monitor them.  I do suspect this is just related to getting over coronavirus.  I suspect she will continue to improve as she is done.  We will see her back in 6 months.   Disposition: Return in about 6 months (around 08/21/2019).  Medication Adjustments/Labs and Tests Ordered: Current medicines are reviewed at length with  the patient today.  Concerns regarding medicines are outlined above.  No orders of the defined types were placed in this encounter.  No orders of the defined types were placed in this encounter.   Patient Instructions  Medication Instructions:  The current medical regimen is effective;  continue present plan and medications.  *If you need a refill on your cardiac medications before your next appointment, please call your pharmacy*  Follow-Up: At [facility], you and your health needs are our priority.  As part of our continuing mission to provide you with exceptional heart care, we have created designated Provider Care Teams.  These Care Teams include your primary Cardiologist (physician) and Advanced Practice Providers (APPs -  Physician Assistants and Nurse Practitioners) who all work together to provide you with the care you need, when you need it.  Your next appointment:   6 month(s)  The format for your next appointment:   Virtual Visit   Provider:   Eleonore Chiquito, MD        Signed, Addison Naegeli. Audie Box, [place]  [address] [place], [place] [address] [phone]  02/21/2019 10:13 AM

## 2019-02-20 MED FILL — ATORVASTATIN 20 MG TABLET: 20 | 90 days supply | Qty: 90 | Fill #0

## 2019-02-20 MED FILL — IRBESARTAN 300 MG TABLET: 300 | 90 days supply | Qty: 90 | Fill #2

## 2019-02-20 MED FILL — VIT D2 1.25 MG (50,000 UNIT: 1.25 MG | 42 days supply | Qty: 12 | Fill #1

## 2019-02-21 ENCOUNTER — Other Ambulatory Visit: Payer: Self-pay

## 2019-02-21 ENCOUNTER — Ambulatory Visit (INDEPENDENT_AMBULATORY_CARE_PROVIDER_SITE_OTHER): Payer: 59 | Admitting: Cardiovascular Disease

## 2019-02-21 ENCOUNTER — Encounter: Payer: Self-pay | Admitting: Cardiovascular Disease

## 2019-02-21 VITALS — BP 144/62 | HR 60 | Ht 64.0 in | Wt 149.0 lb

## 2019-02-21 DIAGNOSIS — R0602 Shortness of breath: Secondary | ICD-10-CM

## 2019-02-21 DIAGNOSIS — R001 Bradycardia, unspecified: Secondary | ICD-10-CM

## 2019-02-21 DIAGNOSIS — I493 Ventricular premature depolarization: Secondary | ICD-10-CM

## 2019-02-21 NOTE — Patient Instructions (Signed)
Medication Instructions:  The current medical regimen is effective;  continue present plan and medications.  *If you need a refill on your cardiac medications before your next appointment, please call your pharmacy*  Follow-Up: At [facility], you and your health needs are our priority.  As part of our continuing mission to provide you with exceptional heart care, we have created designated Provider Care Teams.  These Care Teams include your primary Cardiologist (physician) and Advanced Practice Providers (APPs -  Physician Assistants and Nurse Practitioners) who all work together to provide you with the care you need, when you need it.  Your next appointment:   6 month(s)  The format for your next appointment:   Virtual Visit   Provider:   Eleonore Chiquito, MD

## 2019-02-27 MED FILL — XOLAIR 150 MG/ML SOSY: 150 | 28 days supply | Qty: 4 | Fill #0

## 2019-03-13 MED FILL — SHINGRIX 50 MCG SUS: 50 | 30 days supply | Qty: 1 | Fill #1

## 2019-04-06 MED FILL — VIT D2 1.25 MG (50,000 UNIT: 1.25 MG | 42 days supply | Qty: 12 | Fill #2

## 2019-04-06 NOTE — Telephone Encounter (Signed)
Called [facility] @ [phone] and spoke with [facility]  Per [place], patient's file is not showing that the QVAR is needing PA.  Patient has not filled this since October.  Jasmine ran the Rx now to see if PA popped up, it did not - is currently covered at $49.93.  Jasmine asked if this needs to be filled or put on hold.  Asked Jasmine to place on hold since patient didn't specify and will e-mail patient back.  Per patient's chart, PA was last done on QVAR on 10.12.2020 but there is no documentation as to whether this was approved or not.  Logged onto [facility] - unable to discern if this was approved or not [employer]: [phone] phone [phone] fax  Called [employer] and spoke with representative Lelon Huh who reported that patient's QVAR does NOT require a PA until Nov 2021 - as long as patient is filling her QVAR on a consistent basis, 1 inhaler ever month/3 for 3.  Per Lelon Huh, when time to renew PA will need to prove step therapy >> will need to have tried and failed Arnuity, Flovent discus, Flovent HFA within last 365 days.  E-mail sent to patient with the above information.

## 2019-04-10 MED FILL — XOLAIR 150 MG/ML SOSY: 150 | 28 days supply | Qty: 4 | Fill #1

## 2019-05-03 ENCOUNTER — Other Ambulatory Visit: Payer: Self-pay | Admitting: Pulmonary Disease

## 2019-05-04 ENCOUNTER — Other Ambulatory Visit: Payer: Self-pay

## 2019-05-04 ENCOUNTER — Other Ambulatory Visit: Payer: Self-pay | Admitting: Pharmacist

## 2019-05-04 ENCOUNTER — Ambulatory Visit (HOSPITAL_BASED_OUTPATIENT_CLINIC_OR_DEPARTMENT_OTHER): Payer: 59 | Admitting: Pharmacist

## 2019-05-04 DIAGNOSIS — Z7189 Other specified counseling: Secondary | ICD-10-CM

## 2019-05-04 MED ORDER — OMALIZUMAB 150 MG/ML ~~LOC~~ SOSY: 300.0000 mg | PREFILLED_SYRINGE | SUBCUTANEOUS | 11 refills | Status: DC

## 2019-05-04 NOTE — Progress Notes (Signed)
   S: Patient presents for review of their specialty medication therapy.  Patient is currently taking Xolair for asthma. Patient is managed by Dr. Loanne Drilling for this.   Adherence: confirms  Efficacy: reports that it has worked very well for her.   Dosing: Give subcutaneously. Can be dosed every 2 or 4 weeks based on baseline serum IgE levels and body weight.  Asthma: SubQ: current dose is 300 mg q14days  Dose adjustments: Renal: no dose adjustments  Hepatic: no dose adjustments  Toxicity: Severe hypersensitivity reaction or anaphylaxis: Discontinue treatment. Fever, arthralgia, and rash: Discontinue treatment if this constellation of symptoms occurs.  Drug-drug interactions: none  Monitoring: CV effects: none  Eosinophilia and vasculitis: none; CBC w/ diff WNL 11/14/18 Fever/arthralgia/rash: none Hypersensitivity/Anaphylaxis: none Malignant neoplasms: none   O:     Lab Results  Component Value Date   WBC 7.4 11/14/2018   HGB 12.6 11/14/2018   HCT 38.3 11/14/2018   MCV 94.3 11/14/2018   PLT 330 11/14/2018      Chemistry      Component Value Date/Time   NA 141 11/14/2018 1501   K 4.0 11/14/2018 1501   CL 105 11/14/2018 1501   CO2 26 11/14/2018 1501   BUN 22 11/14/2018 1501   CREATININE 1.27 (H) 11/14/2018 1501      Component Value Date/Time   CALCIUM 9.9 11/14/2018 1501   ALKPHOS 83 02/27/2008 1205   AST 21 02/27/2008 1205   ALT 23 02/27/2008 1205   BILITOT 1.4 (H) 02/27/2008 1205       A/P: 1. Medication review: patient currently on Xolair for asthma and is tolerating it well with good efficacy. Reviewed the medication with the patient, including the following: Xolair, omalizumab, is a novel IgE blocker.  It appears to reduce rates of hospitalizations, ER visits and unscheduled physician visits due asthma exacerbations when added to standard therapy.  Studies also show a reduction in steroid requirements and improvement in quality of life.  Patient educated  on purpose, proper use and potential adverse effects of Xolair.  Following instruction patient verbalized understanding. Patient should always have an EpiPen readily available in the event of anaphylaxis. SubQ: For SubQ injection only; doses >150 mg should be divided over more than one injection site (eg, 225 mg or 300 mg administered as two injections, 375 mg administered as three injections); each injection site should be separated by ?1 inch. Do not inject into moles, scars, bruises, tender areas, or broken skin. Injections may take 5 to 10 seconds to administer (solution is slightly viscous). Administer only under direct medical supervision and observe patient for 2 hours after the first 3 injections and 30 minutes after subsequent injections Dellia Cloud 2015) or in accordance with individual institution policies and procedures.No recommendations for any changes at this time.   Benard Halsted, PharmD, [place] [phone]

## 2019-05-08 ENCOUNTER — Other Ambulatory Visit: Payer: Self-pay | Admitting: Pulmonary Disease

## 2019-05-08 MED FILL — QVAR REDIHALER 80 MCG/ACT A: 80 | 30 days supply | Qty: 11 | Fill #1

## 2019-05-08 MED FILL — HYDROCHLOROTHIAZIDE 25 MG T: 25 | 90 days supply | Qty: 90 | Fill #1

## 2019-05-09 ENCOUNTER — Ambulatory Visit: Payer: 59 | Admitting: Pulmonary Disease

## 2019-05-09 ENCOUNTER — Other Ambulatory Visit: Payer: Self-pay | Admitting: *Deleted

## 2019-05-09 ENCOUNTER — Encounter: Payer: Self-pay | Admitting: Pulmonary Disease

## 2019-05-09 ENCOUNTER — Other Ambulatory Visit: Payer: Self-pay

## 2019-05-09 VITALS — BP 126/66 | HR 50 | Temp 97.3°F | Ht 64.0 in | Wt 155.8 lb

## 2019-05-09 DIAGNOSIS — J4550 Severe persistent asthma, uncomplicated: Secondary | ICD-10-CM | POA: Diagnosis not present

## 2019-05-09 MED ORDER — OMALIZUMAB 150 MG/ML ~~LOC~~ SOSY: 300.0000 mg | PREFILLED_SYRINGE | SUBCUTANEOUS | 12 refills | Status: DC

## 2019-05-09 MED FILL — VIT D2 1.25 MG (50,000 UNIT: 1.25 MG | 84 days supply | Qty: 24 | Fill #0

## 2019-05-09 MED FILL — XOLAIR 150 MG/ML SOSY: 150 | 28 days supply | Qty: 4 | Fill #0

## 2019-05-09 NOTE — Progress Notes (Signed)
Subjective:   PATIENT ID: [name] GENDER: female DOB: 1945/07/13, MRN: [id number]   HPI  Chief Complaint  Patient presents with  . Follow-up    3 mos f/u.   Reason for Visit: Follow-up   [name] is a 74 year old female with severe persistent asthma, hx of COVID-19 10/2018, hx bilateral segmental pulmonary emboli s/p anticoagulation x 3 months who presents for follow-up.  She has been compliant with her Dulera BID and Qvar BID. Tolerating Xolair 300 mg q2 weeks without issue. She is overall well-controlled. No daytime or nocturnal symptoms. Denies shortness of breath, wheezing or cough. No exacerbations for >12 months. She still has intermittent palpitations and diligently checks her heart rate which is often in the 50s. Denies headaches, dizziness, near-syncope, visual changes or nausea.  I have personally reviewed patient's past medical/family/social history/allergies/current medications.  Past Medical History:  Diagnosis Date  . Asthma   . Colon polyps   . DM type 2 (diabetes mellitus, type 2) ([place]) 2015   . Hyperlipidemia   . Hypertension   . Obesity   . Tubular adenoma     Outpatient Medications Prior to Visit  Medication Sig Dispense Refill  . albuterol (PROVENTIL) (2.5 MG/3ML) 0.083% nebulizer solution USE 1 VIAL IN NEBULIZER EVERY 6 HOURS AS NEEDED FOR WHEEZING. 75 mL 1  . atorvastatin (LIPITOR) 20 MG tablet Take 20 mg by mouth daily.    . beclomethasone (QVAR REDIHALER) 80 MCG/ACT inhaler INHALE 2 PUFFS BY MOUTH 2 (TWO) TIMES DAILY. 31.8 g 6  . Denosumab (PROLIA [place]) Inject into the skin every 6 (six) months.    . hydrochlorothiazide (MICROZIDE) 12.5 MG capsule Take 12.5 mg by mouth daily.    . irbesartan (AVAPRO) 300 MG tablet Take 300 mg by mouth daily.     . metFORMIN (GLUCOPHAGE-XR) 500 MG 24 hr tablet Take 1 tablet by mouth daily with breakfast. Currently taking only 3 times weekly    . mometasone-formoterol (DULERA) 200-5 MCG/ACT AERO  INHALE 2 PUFFS BY MOUTH 2 TIMES DAILY. 39 g 1  . montelukast (SINGULAIR) 10 MG tablet TAKE 1 TABLET (10 MG TOTAL) BY MOUTH DAILY. 90 tablet 3  . omalizumab (XOLAIR) 150 MG/ML prefilled syringe Inject 300 mg into the skin every 14 (fourteen) days. 4 mL 11  . VENTOLIN HFA 108 (90 Base) MCG/ACT inhaler INHALE 1-2 PUFFS BY MOUTH 4 TIMES DAILY AS NEEDED 54 g 0  . Vitamin D, Ergocalciferol, (DRISDOL) 50000 UNITS CAPS Take 50,000 Units by mouth. Twice weekly     Facility-Administered Medications Prior to Visit  Medication Dose Route Frequency Provider Last Rate Last Admin  . omalizumab Arvid Right) injection 300 mg  300 mg Subcutaneous Q14 Days Simonne Maffucci B, MD   300 mg at 09/07/18 1417    Review of Systems  Constitutional: Negative for chills, diaphoresis, fever, malaise/fatigue and weight loss.  HENT: Negative for congestion.   Respiratory: Negative for cough, hemoptysis, sputum production, shortness of breath and wheezing.   Cardiovascular: Positive for palpitations. Negative for chest pain and leg swelling.     Objective:   Vitals:   05/09/19 1542  BP: 126/66  Pulse: (!) 50  Temp: (!) 97.3 F (36.3 C)  TempSrc: Temporal  SpO2: 98%  Weight: 155 lb 12.8 oz (70.7 kg)  Height: 5\' 4"  (1.626 m)   SpO2: 98 % O2 Device: None (Room air)  Physical Exam: General: Well-appearing, no acute distress HENT: [place], AT Eyes: EOMI, no scleral icterus Respiratory:  Clear to auscultation bilaterally.  No crackles, wheezing or rales Cardiovascular: Bradycardia, RR, -M/R/G, no JVD Extremities:-Edema,-tenderness Neuro: AAO x4, CNII-XII grossly intact Skin: Intact, no rashes or bruising Psych: Normal mood, normal affect  Data Reviewed:  Imaging: HRCT 04/02/16 - air trapping present on expiration, mild cylindrical bronchiectasis and scattered sub-centimeter pulmonary nodules < 4 mm that are unchanged  CTA 11/14/18 - several bilateral tiny pulmonary emboli in segmental branches of RUL, RLL, lingula  and LLL. Stable bilateral pulmonary nodules  PFT: None on file  Imaging, labs and test noted above have been reviewed independently by me.    Assessment & Plan:   Discussion: 74 year old female with severe persistent asthma, hx aspergillus IgE antibody moderately + in 2017, prior COVID-19 infection, bilateral segmental pulmonary emboli in the setting of COVID-19 s/p 3 months of anticoagulation and benign pulmonary nodules who presents follow-up.  Severe Persistent Asthma - Well-controlled CONTINUE Dulera 200-5 mcg 1 puff twice a day CONTINUE Qvar 80 mcg 1 puff twice a day Will decrease Xolair from every 2 weeks to every 4 weeks. Will arrange follow-up with NP in 3 months to re-evaluate and if needed, we can return to q2 week injections.  Benign pulmonary nodules Stable since 2018. No further imaging warranted  Bradycardia, palpitations Followed by Cardiology with Dr. Audie Box  Health Maintenance Immunization History  Administered Date(s) Administered  . Influenza Split 11/02/2012, 11/05/2014, 10/04/2015  . Influenza Whole 10/13/2007, 11/03/2010, 11/06/2011  . Influenza, High Dose Seasonal PF 11/02/2016, 12/05/2018  . Influenza-Unspecified 11/02/2013  . PFIZER SARS-COV-2 Vaccination 01/20/2019, 02/10/2019  . Pneumococcal Conjugate-13 11/02/2012  . Pneumococcal Polysaccharide-23 02/03/2008, 12/05/2018  . Zoster Recombinat (Shingrix) 01/05/2019, 03/16/2019   No orders of the defined types were placed in this encounter.  No orders of the defined types were placed in this encounter.   Return in 3 months (on 08/08/2019) for with NP.  I have spent a total time of 32-minutes on the day of the appointment reviewing prior documentation, coordinating care with Xolair medication changes and discussing medical diagnosis and plan with the patient/family. Imaging, labs and tests included in this note have been reviewed and interpreted independently by me.   [place], MD [place]  Pulmonary Critical Care 05/09/2019 3:52 PM  Office Number [phone]

## 2019-05-09 NOTE — Patient Instructions (Signed)
Severe Persistent Asthma - well-controlled CONTINUE Dulera 200-5 mcg 1 puff twice a day CONTINUE Qvar 80 mcg 1 puff twice a day Will decrease Xolair from every 2 weeks to every 4 weeks. Will arrange follow-up with NP in 3 months to re-evaluate and if needed, we can return to q2 week injections.

## 2019-05-10 ENCOUNTER — Encounter: Payer: Self-pay | Admitting: Pulmonary Disease

## 2019-05-10 MED FILL — DULERA 200 MCG/5 MCG INH: 200-5 | 30 days supply | Qty: 13 | Fill #0

## 2019-05-10 MED FILL — MONTELUKAST SOD 10 MG TAB: 10 | 90 days supply | Qty: 90 | Fill #0

## 2019-05-29 MED FILL — ATORVASTATIN 20 MG TABLET: 20 | 90 days supply | Qty: 90 | Fill #1

## 2019-05-30 ENCOUNTER — Other Ambulatory Visit (HOSPITAL_COMMUNITY): Payer: Self-pay | Admitting: Internal Medicine

## 2019-05-30 MED FILL — IRBESARTAN 300 MG TAB: 300 | 90 days supply | Qty: 90 | Fill #0

## 2019-06-01 MED FILL — XOLAIR 150 MG/ML SOSY: 150 | 28 days supply | Qty: 4 | Fill #1

## 2019-06-09 MED FILL — IRBESARTAN 300 MG TAB: 300 | 90 days supply | Qty: 90 | Fill #0

## 2019-06-09 MED FILL — ATORVASTATIN 20 MG TABLET: 20 | 90 days supply | Qty: 90 | Fill #1

## 2019-07-15 ENCOUNTER — Other Ambulatory Visit: Payer: Self-pay | Admitting: Pulmonary Disease

## 2019-07-20 ENCOUNTER — Other Ambulatory Visit: Payer: Self-pay | Admitting: Pharmacist

## 2019-07-20 MED ORDER — OMALIZUMAB 150 MG/ML ~~LOC~~ SOSY: PREFILLED_SYRINGE | SUBCUTANEOUS | 12 refills | Status: DC

## 2019-07-28 ENCOUNTER — Telehealth: Payer: Self-pay | Admitting: Cardiovascular Disease

## 2019-07-28 NOTE — Telephone Encounter (Signed)
I attempted to contact patient 07/28/19 to schedule follow up visit. The patient didn't answer, left message for patient to return call

## 2019-08-01 MED FILL — HYDROCHLOROTHIAZIDE 25 MG T: 25 | 90 days supply | Qty: 90 | Fill #2

## 2019-08-01 MED FILL — MONTELUKAST SOD 10 MG TAB: 10 | 90 days supply | Qty: 90 | Fill #1

## 2019-08-01 MED FILL — VIT D2 1.25 MG (50,000 UNIT: 1.25 MG | 84 days supply | Qty: 24 | Fill #0

## 2019-08-01 MED FILL — METFORMIN HCL ER 500 MG TB2: 500 | 90 days supply | Qty: 90 | Fill #1

## 2019-08-04 NOTE — Telephone Encounter (Signed)
I called patient to discuss, she states that she is feeling okay, she is just concerned with her BP-  I double booked for Tuesday for her to come in.  But did advise patient that if she began to have symptoms- chest pains, difference in SOB, and went over stroke information with her as well patient will go to ER.  Patient verbalized understanding.   Will make MD aware.  Thanks!

## 2019-08-07 MED FILL — XOLAIR 150 MG/ML SOSY: 150 | 28 days supply | Qty: 2 | Fill #0

## 2019-08-07 NOTE — Progress Notes (Signed)
Cardiology Office Note:   Date:  08/08/2019  NAME:  CESIAH WESTLEY    MRN: [id number] DOB:  1945-10-13   PCP:  Shon Baton, MD  Cardiologist:  Evalina Field, MD  Electrophysiologist:  None   Referring MD: Shon Baton, MD   Chief Complaint  Patient presents with   Follow-up   History of Present Illness:   MAKAIAH TERWILLIGER is a 74 y.o. female with a hx of HTN, covid 19 PNA, PE, palpitations who presents for follow-up. Recent issues with elevated BP.   She reports that her blood pressure has been quite elevated at work.  She is still working full-time as a Retail buyer at [employer].  She reports her heart rate is also been low.  She reports that her blood pressure has been in the 180 range and she is concerned about having a stroke.  Her blood pressure has been elevated for the past 3 weeks.  She reports is also coincided with the recent sudden death of her son.  She reports she is under significant stress with work and then this recent death in the family.  She reports she is doing well with it and has several mechanisms of support.  Her blood pressure today is 150/80.  She also reports symptoms of shortness of breath that mainly occur at rest.  She reports when she talks or tries to give a presentation at work she does notice that she short of breath.  She reports no increased lower extremity edema.  She reports she has gained some weight but this is because she is not exercising.  She has not seen pulmonology but has plans to.  Her EKG today demonstrates sinus bradycardia heart rate 47.  She has normal intervals and narrow QRS.  She denies chest pain or palpitations today in office.  Problem List 1. Covid 19 PNA -PE 2. Palpitations -PAC burden 3.4% 3. HTN 4. DM  Past Medical History: Past Medical History:  Diagnosis Date   Asthma    Colon polyps    DM type 2 (diabetes mellitus, type 2) ([place]) 2015    Hyperlipidemia    Hypertension    Obesity    Pulmonary  embolism associated with COVID-19 ([place]) 12/25/2018   Tubular adenoma     Past Surgical History: Past Surgical History:  Procedure Laterality Date   APPENDECTOMY     CARDIAC CATHETERIZATION     CATARACT EXTRACTION, BILATERAL  2011   CHOLECYSTECTOMY     ESOPHAGOGASTRODUODENOSCOPY  2011   TONSILLECTOMY     VESICOVAGINAL FISTULA CLOSURE W/ TAH      Current Medications: Current Meds  Medication Sig   albuterol (PROVENTIL) (2.5 MG/3ML) 0.083% nebulizer solution USE 1 VIAL IN NEBULIZER EVERY 6 HOURS AS NEEDED FOR WHEEZING.   atorvastatin (LIPITOR) 20 MG tablet Take 20 mg by mouth daily.   beclomethasone (QVAR REDIHALER) 80 MCG/ACT inhaler INHALE 2 PUFFS BY MOUTH 2 (TWO) TIMES DAILY.   Denosumab (PROLIA [place]) Inject into the skin every 6 (six) months.   hydrochlorothiazide (MICROZIDE) 12.5 MG capsule Take 12.5 mg by mouth daily.   irbesartan (AVAPRO) 300 MG tablet Take 300 mg by mouth daily.    metFORMIN (GLUCOPHAGE-XR) 500 MG 24 hr tablet Take 1 tablet by mouth daily with breakfast. Currently taking only 3 times weekly   mometasone-formoterol (DULERA) 200-5 MCG/ACT AERO INHALE 2 PUFFS BY MOUTH 2 TIMES DAILY.   montelukast (SINGULAIR) 10 MG tablet TAKE 1 TABLET (10 MG TOTAL) BY MOUTH DAILY.  omalizumab (XOLAIR) 150 MG/ML prefilled syringe INJECT 300 MG INTO THE SKIN EVERY 28 (TWENTY-EIGHT) DAYS.   VENTOLIN HFA 108 (90 Base) MCG/ACT inhaler INHALE 1-2 PUFFS BY MOUTH 4 TIMES DAILY AS NEEDED   Vitamin D, Ergocalciferol, (DRISDOL) 50000 UNITS CAPS Take 50,000 Units by mouth. Twice weekly   Current Facility-Administered Medications for the 08/08/19 encounter (Office Visit) with Geralynn Rile, MD  Medication   omalizumab Arvid Right) injection 300 mg     Allergies:    Patient has no known allergies.   Social History: Social History   Socioeconomic History   Marital status: Widowed    Spouse name: Not on file   Number of children: 4   Years of education: Not  on file   Highest education level: Not on file  Occupational History   Occupation: Programmer, multimedia: [employer]  Tobacco Use   Smoking status: Former Smoker    Packs/day: 0.30    Years: 10.00    Pack years: 3.00    Types: Cigarettes    Start date: 101    Quit date: 02/03/1975    Years since quitting: 44.5   Smokeless tobacco: Never Used  Substance and Sexual Activity   Alcohol use: No   Drug use: No   Sexual activity: Not on file  Other Topics Concern   Not on file  Social History Narrative   Not on file   Social Determinants of Health   Financial Resource Strain:    Difficulty of Paying Living Expenses:   Food Insecurity:    Worried About Charity fundraiser in the Last Year:    Arboriculturist in the Last Year:   Transportation Needs:    Film/video editor (Medical):    Lack of Transportation (Non-Medical):   Physical Activity:    Days of Exercise per Week:    Minutes of Exercise per Session:   Stress:    Feeling of Stress :   Social Connections:    Frequency of Communication with Friends and Family:    Frequency of Social Gatherings with Friends and Family:    Attends Religious Services:    Active Member of Clubs or Organizations:    Attends Music therapist:    Marital Status:      Family History: The patient's family history includes Asthma in an other family member; Diabetes in her brother, maternal grandfather, and maternal grandmother; Heart disease in her brother, father, and mother.  ROS:   All other ROS reviewed and negative. Pertinent positives noted in the HPI.     EKGs/Labs/Other Studies Reviewed:   The following studies were personally reviewed by me today:  EKG:  EKG is ordered today.  The ekg ordered today demonstrates Sinus bradycardia, heart rate 47, no acute ST-T changes, no evidence of prior infarction, and was personally reviewed by me.   TTE 11/17/2018 1. Left ventricular ejection fraction, by  visual estimation, is 60 to  65%. The left ventricle has normal function. Normal left ventricular size.  There is no left ventricular hypertrophy.  2. Low normal GLS -14.8.  3. Global right ventricle has normal systolic function.The right  ventricular size is normal. No increase in right ventricular wall  thickness.  4. Left atrial size was normal.  5. Right atrial size was normal.  6. The mitral valve is normal in structure. Mild mitral valve  regurgitation. No evidence of mitral stenosis.  7. The tricuspid valve is normal in structure. Tricuspid  valve  regurgitation was not visualized by color flow Doppler.  8. The aortic valve is normal in structure. Aortic valve regurgitation  was not visualized by color flow Doppler. Structurally normal aortic  valve, with no evidence of sclerosis or stenosis.  9. The pulmonic valve was normal in structure. Pulmonic valve  regurgitation is not visualized by color flow Doppler.  10. Normal pulmonary artery systolic pressure.  11. The inferior vena cava is normal in size with greater than 50%  respiratory variability, suggesting right atrial pressure of 3 mmHg.   Patient had a min HR of 39 bpm (sinus bradycardia), max HR of 164 bpm (supraventricular tachycardia), and avg HR of 49 bpm (sinus bradycardia). Predominant underlying rhythm was Sinus Rhythm. 1 run of non-sustained ventricular tachycardia occurred lasting 5 beats (3.8 second duration) with a max rate of 124 bpm (avg 100 bpm). 6 Supraventricular Tachycardia runs occurred, the run with the fastest interval lasting 10 beats (5.8 second duration) with a max rate of 164 bpm (avg 124 bpm); the run with the fastest interval was also the longest. Junctional Rhythm was present, likely representing isorhythmic dissociation. Isolated SVEs were occasional (3.4%, 7352), SVE Couplets were rare (<1.0%, 7), and no SVE Triplets were present. Isolated VEs were rare (<1.0%), and no VE Couplets or VE Triplets  were present. No atrial fibrillation detected. Patient diary summarized below:  11/25/2018 @ 11:20 AM: symptoms if skipped/irregular beats, fluttering/racing, dizziness while standing coincided with sinus bradycardia ~49-52 bpm. A junctional rhythm (likely isorhythmic dissociation) was present.  11/25/2018 @ 11:50 AM: symptoms of pounding coincided with sinus bradycardia ~46 bpm with occasional junctional rhythm (likely isorhythmic dissociation).  11/25/2018 @ 7:25 PM: symptoms of skipped/irregular beats coincided with sinus bradycardia ~53 bpm and rare PACs/PVCs.   Impression:  1. Predominant rhythm was sinus bradycardia @ 49 bpm.  2. No significant arrhythmias to explain symptoms.  3. Isorhythmic dissociation present (benign finding).  4. Rare PACs and PVCs.      Recent Labs: 11/14/2018: B Natriuretic Peptide 207.2; BUN 22; Creatinine, Ser 1.27; Hemoglobin 12.6; Platelets 330; Potassium 4.0; Sodium 141; TSH 2.210   Recent Lipid Panel No results found for: CHOL, TRIG, HDL, CHOLHDL, VLDL, LDLCALC, LDLDIRECT  Physical Exam:   VS:  BP (!) 150/80    Pulse (!) 47    Ht 5\' 4"  (1.626 m)    Wt 161 lb (73 kg)    SpO2 99%    BMI 27.64 kg/m    Wt Readings from Last 3 Encounters:  08/08/19 161 lb (73 kg)  05/09/19 155 lb 12.8 oz (70.7 kg)  02/21/19 149 lb (67.6 kg)    General: Well nourished, well developed, in no acute distress Heart: Atraumatic, normal size  Eyes: PEERLA, EOMI  Neck: Supple, no JVD Endocrine: No thryomegaly Cardiac: Normal S1, S2; RRR; no murmurs, rubs, or gallops Lungs: Clear to auscultation bilaterally, no wheezing, rhonchi or rales  Abd: Soft, nontender, no hepatomegaly  Ext: No edema, pulses 2+ Musculoskeletal: No deformities, BUE and BLE strength normal and equal Skin: Warm and dry, no rashes   Neuro: Alert and oriented to person, place, time, and situation, CNII-XII grossly intact, no focal deficits  Psych: Normal mood and affect   ASSESSMENT:   LURIA ROSARIO is a 74 y.o. female who presents for the following: 1. SOB (shortness of breath)   2. Sinus bradycardia   3. Palpitations   4. Essential hypertension   5. Mixed hyperlipidemia     PLAN:  1. SOB (shortness of breath) -EKG with sinus bradycardia no acute ischemic changes.  There is no evidence of any conduction disease.  She has normal intervals.  I do not think this is the cause of her shortness of breath.  She has no evidence of heart failure on exam.  She has no lower extremity edema.  We will check a BNP to further reassure her that this is not her heart.  I suspect this could be stress.  She is recently lost her son which is coincided with symptoms of increased blood pressure.  I really suspect this is stress.  She will continue to work on stress reduction strategies.  2. Sinus bradycardia -Heart rate 47 today.  No symptoms concerning for worsening conduction disease.  She has narrow intervals and really no concerns for pacemaker need.  I have advised her to avoid any AV nodal agents moving forward.  3. Palpitations -Not an issue currently.  4. Essential hypertension -BP elevated.  150/80.  We will add Norvasc 10 mg daily.  I will see her back in 6 weeks and see how she does.  5. Mixed hyperlipidemia -Continue Lipitor   Disposition: Return in about 6 weeks (around 09/19/2019).  Medication Adjustments/Labs and Tests Ordered: Current medicines are reviewed at length with the patient today.  Concerns regarding medicines are outlined above.  Orders Placed This Encounter  Procedures   Brain natriuretic peptide   EKG 12-Lead   Meds ordered this encounter  Medications   amLODipine (NORVASC) 10 MG tablet    Sig: Take 1 tablet (10 mg total) by mouth daily.    Dispense:  180 tablet    Refill:  3    Patient Instructions  Medication Instructions:  Start Amlodipine 10 mg daily   *If you need a refill on your cardiac medications before your next appointment, please call  your pharmacy*   Lab Work: BNP today   If you have labs (blood work) drawn today and your tests are completely normal, you will receive your results only by:  [place] (if you have MyChart) OR  A paper copy in the mail If you have any lab test that is abnormal or we need to change your treatment, we will call you to review the results.    Follow-Up: At [facility], you and your health needs are our priority.  As part of our continuing mission to provide you with exceptional heart care, we have created designated Provider Care Teams.  These Care Teams include your primary Cardiologist (physician) and Advanced Practice Providers (APPs -  Physician Assistants and Nurse Practitioners) who all work together to provide you with the care you need, when you need it.  We recommend signing up for the patient portal called "MyChart".  Sign up information is provided on this After Visit Summary.  MyChart is used to connect with patients for Virtual Visits (Telemedicine).  Patients are able to view lab/test results, encounter notes, upcoming appointments, etc.  Non-urgent messages can be sent to your provider as well.   To learn more about what you can do with MyChart, go to [email or web address].    Your next appointment:   6 week(s)  The format for your next appointment:   In Person  Provider:   Eleonore Chiquito, MD        Time Spent with Patient: I have spent a total of 25 minutes with patient reviewing hospital notes, telemetry, EKGs, labs and examining the patient as well as establishing an  assessment and plan that was discussed with the patient.  > 50% of time was spent in direct patient care.  Signed, Addison Naegeli. Audie Box, [place]  [address] [place], [place] [address] [phone]  08/08/2019 10:46 AM

## 2019-08-08 ENCOUNTER — Ambulatory Visit: Payer: 59 | Admitting: Adult Health

## 2019-08-08 ENCOUNTER — Encounter: Payer: Self-pay | Admitting: Cardiovascular Disease

## 2019-08-08 ENCOUNTER — Other Ambulatory Visit: Payer: Self-pay | Admitting: Cardiovascular Disease

## 2019-08-08 ENCOUNTER — Ambulatory Visit: Payer: 59 | Admitting: Cardiovascular Disease

## 2019-08-08 ENCOUNTER — Other Ambulatory Visit: Payer: Self-pay

## 2019-08-08 VITALS — BP 150/80 | HR 47 | Ht 64.0 in | Wt 161.0 lb

## 2019-08-08 DIAGNOSIS — I10 Essential (primary) hypertension: Secondary | ICD-10-CM

## 2019-08-08 DIAGNOSIS — R002 Palpitations: Secondary | ICD-10-CM

## 2019-08-08 DIAGNOSIS — E782 Mixed hyperlipidemia: Secondary | ICD-10-CM

## 2019-08-08 DIAGNOSIS — R0602 Shortness of breath: Secondary | ICD-10-CM

## 2019-08-08 DIAGNOSIS — R001 Bradycardia, unspecified: Secondary | ICD-10-CM

## 2019-08-08 MED ORDER — AMLODIPINE BESYLATE 10 MG PO TABS: 10.0000 mg | ORAL_TABLET | Freq: Every day | ORAL | 3 refills | Status: DC

## 2019-08-08 MED FILL — AMLODIPINE BESYLATE 10 MG T: 10 | 90 days supply | Qty: 90 | Fill #0

## 2019-08-08 NOTE — Patient Instructions (Signed)
Medication Instructions:  Start Amlodipine 10 mg daily   *If you need a refill on your cardiac medications before your next appointment, please call your pharmacy*   Lab Work: BNP today   If you have labs (blood work) drawn today and your tests are completely normal, you will receive your results only by: Marland Kitchen MyChart Message (if you have MyChart) OR . A paper copy in the mail If you have any lab test that is abnormal or we need to change your treatment, we will call you to review the results.    Follow-Up: At [facility], you and your health needs are our priority.  As part of our continuing mission to provide you with exceptional heart care, we have created designated Provider Care Teams.  These Care Teams include your primary Cardiologist (physician) and Advanced Practice Providers (APPs -  Physician Assistants and Nurse Practitioners) who all work together to provide you with the care you need, when you need it.  We recommend signing up for the patient portal called "MyChart".  Sign up information is provided on this After Visit Summary.  MyChart is used to connect with patients for Virtual Visits (Telemedicine).  Patients are able to view lab/test results, encounter notes, upcoming appointments, etc.  Non-urgent messages can be sent to your provider as well.   To learn more about what you can do with MyChart, go to [email or web address].    Your next appointment:   6 week(s)  The format for your next appointment:   In Person  Provider:   Eleonore Chiquito, MD

## 2019-08-09 LAB — BRAIN NATRIURETIC PEPTIDE: BNP: 98.7 pg/mL (ref 0.0–100.0)

## 2019-08-11 ENCOUNTER — Ambulatory Visit: Payer: 59 | Admitting: Cardiovascular Disease

## 2019-08-21 ENCOUNTER — Ambulatory Visit: Payer: 59 | Admitting: Adult Health

## 2019-08-21 ENCOUNTER — Other Ambulatory Visit: Payer: Self-pay

## 2019-08-21 ENCOUNTER — Encounter: Payer: Self-pay | Admitting: Adult Health

## 2019-08-21 DIAGNOSIS — J4550 Severe persistent asthma, uncomplicated: Secondary | ICD-10-CM | POA: Diagnosis not present

## 2019-08-21 NOTE — Progress Notes (Signed)
@Patient  ID: Elaine Luna, female    DOB: [birth year], 74 y.o.   MRN: [id number]  Chief Complaint  Patient presents with  . Follow-up    Asthma     Referring provider: Shon Baton, MD  HPI: 74 year old female former smoker  with severe persistent asthma COVID-19 infection in September 2020.  History of bilateral PE status post anticoagulation x3 months.  In the setting of COVID-19  aspergillus IgE antibody moderately + in 2017  TEST/EVENTS :  HRCT 04/02/16 - air trapping present on expiration, mild cylindrical bronchiectasis and scattered sub-centimeter pulmonary nodules < 4 mm that are unchanged  CTA 11/14/18 - several bilateral tiny pulmonary emboli in segmental branches of RUL, RLL, lingula and LLL. Stable bilateral pulmonary nodules  CT chest 03/2015 -Minimal cylindrical bronchiectasis in the right middle lobe and lingula. Clustered centrilobular pulmonary nodules in the peripheral right lower lobe, suggestive of mucoid impaction within dilated terminal bronchioles. 2. Prominent patchy air trapping throughout both lungs, indicating small airways disease. 3. Otherwise no evidence of interstitial lung disease. 4. Additional scattered solid pulmonary nodules in both lungs, largest 4 mm   08/21/2019 Follow up : Asthma  Patient returns for a 95-month follow-up.  She says overall she has been doing well.  She continues to work full-time.  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 She remains on Dulera twice daily and Qvar twice daily.  She is on Xolair every 4 weeks and does her self injections.  She does have an EpiPen.  She says she has been doing well with no flare of her cough or wheezing.  No increased albuterol use.  Covid vaccine is up-to-date.     No Known Allergies  Immunization History  Administered Date(s) Administered  . Influenza Split 11/02/2012, 11/05/2014, 10/04/2015  . Influenza Whole 10/13/2007, 11/03/2010, 11/06/2011  . Influenza, High Dose Seasonal PF  11/02/2016, 12/05/2018  . Influenza-Unspecified 11/02/2013  . PFIZER SARS-COV-2 Vaccination 01/20/2019, 02/10/2019  . Pneumococcal Conjugate-13 11/02/2012  . Pneumococcal Polysaccharide-23 02/03/2008, 12/05/2018  . Zoster Recombinat (Shingrix) 01/05/2019, 03/16/2019    Past Medical History:  Diagnosis Date  . Asthma   . Colon polyps   . DM type 2 (diabetes mellitus, type 2) ([place]) 2015   . Hyperlipidemia   . Hypertension   . Obesity   . Pulmonary embolism associated with COVID-19 ([place]) 12/25/2018  . Tubular adenoma     Tobacco History: Social History   Tobacco Use  Smoking Status Former Smoker  . Packs/day: 0.30  . Years: 10.00  . Pack years: 3.00  . Types: Cigarettes  . Start date: 82  . Quit date: 02/03/1975  . Years since quitting: 44.5  Smokeless Tobacco Never Used   Counseling given: Not Answered   Outpatient Medications Prior to Visit  Medication Sig Dispense Refill  . albuterol (PROVENTIL) (2.5 MG/3ML) 0.083% nebulizer solution USE 1 VIAL IN NEBULIZER EVERY 6 HOURS AS NEEDED FOR WHEEZING. 75 mL 1  . amLODipine (NORVASC) 10 MG tablet Take 1 tablet (10 mg total) by mouth daily. 180 tablet 3  . atorvastatin (LIPITOR) 20 MG tablet Take 20 mg by mouth daily.    . beclomethasone (QVAR REDIHALER) 80 MCG/ACT inhaler INHALE 2 PUFFS BY MOUTH 2 (TWO) TIMES DAILY. 31.8 g 6  . Denosumab (PROLIA [place]) Inject into the skin every 6 (six) months.    . hydrochlorothiazide (MICROZIDE) 12.5 MG capsule Take 12.5 mg by mouth daily.    . irbesartan (AVAPRO) 300 MG tablet Take 300 mg by mouth  daily.     . metFORMIN (GLUCOPHAGE-XR) 500 MG 24 hr tablet Take 1 tablet by mouth daily with breakfast. Currently taking only 3 times weekly    . mometasone-formoterol (DULERA) 200-5 MCG/ACT AERO INHALE 2 PUFFS BY MOUTH 2 TIMES DAILY. 13 g 1  . montelukast (SINGULAIR) 10 MG tablet TAKE 1 TABLET (10 MG TOTAL) BY MOUTH DAILY. 90 tablet 1  . omalizumab (XOLAIR) 150 MG/ML prefilled syringe INJECT 300 MG  INTO THE SKIN EVERY 28 (TWENTY-EIGHT) DAYS. 2 mL 12  . VENTOLIN HFA 108 (90 Base) MCG/ACT inhaler INHALE 1-2 PUFFS BY MOUTH 4 TIMES DAILY AS NEEDED 54 g 0  . Vitamin D, Ergocalciferol, (DRISDOL) 50000 UNITS CAPS Take 50,000 Units by mouth. Twice weekly     Facility-Administered Medications Prior to Visit  Medication Dose Route Frequency Provider Last Rate Last Admin  . omalizumab Arvid Right) injection 300 mg  300 mg Subcutaneous Q14 Days Simonne Maffucci B, MD   300 mg at 09/07/18 1417     Review of Systems:   Constitutional:   No  weight loss, night sweats,  Fevers, chills,  +fatigue, or  lassitude.  HEENT:   No headaches,  Difficulty swallowing,  Tooth/dental problems, or  Sore throat,                No sneezing, itching, ear ache, nasal congestion, post nasal drip,   CV:  No chest pain,  Orthopnea, PND, swelling in lower extremities, anasarca, dizziness, palpitations, syncope.   GI  No heartburn, indigestion, abdominal pain, nausea, vomiting, diarrhea, change in bowel habits, loss of appetite, bloody stools.   Resp: No chest wall deformity  Skin:   no rash or lesions.  GU: no dysuria, change in color of urine, no urgency or frequency.  No flank pain, no hematuria   MS:  No joint pain or swelling.  No decreased range of motion.  No back pain.    Physical Exam  BP 118/62 (BP Location: Left Arm, Cuff Size: Normal)   Pulse (!) 45   Temp 97.7 F (36.5 C) (Oral)   Ht 5\' 4"  (1.626 m)   Wt 160 lb 6.4 oz (72.8 kg)   SpO2 99%   BMI 27.53 kg/m   GEN: A/Ox3; pleasant , NAD, well nourished    HEENT:  [place]/AT,  NOSE-clear, THROAT-clear, no lesions, no postnasal drip or exudate noted.   NECK:  Supple w/ fair ROM; no JVD; normal carotid impulses w/o bruits; no thyromegaly or nodules palpated; no lymphadenopathy.    RESP  Clear  P & A; w/o, wheezes/ rales/ or rhonchi. no accessory muscle use, no dullness to percussion  CARD:  RRR, no m/r/g, no peripheral edema, pulses intact, no  cyanosis or clubbing.  GI:   Soft & nt; nml bowel sounds; no organomegaly or masses detected.   Musco: Warm bil, no deformities or joint swelling noted.   Neuro: alert, no focal deficits noted.    Skin: Warm, no lesions or rashes    Lab Results:  ProBNP  Imaging: No results found.    No flowsheet data found.  No results found for: NITRICOXIDE      Assessment & Plan:   No problem-specific Assessment & Plan notes found for this encounter.     Rexene Edison, NP 08/21/2019

## 2019-08-21 NOTE — Patient Instructions (Addendum)
Continue on Dulera and QVAR .  Continue on Xolair every 4 weeks  Activity as tolerated.  Albuterol as needed  Follow up with Dr. Loanne Drilling in 3-4 months and As needed

## 2019-08-25 NOTE — Assessment & Plan Note (Signed)
Currently well controlled on present regimen  Plan  Patient Instructions  Continue on Dulera and QVAR .  Continue on Xolair every 4 weeks  Activity as tolerated.  Albuterol as needed  Follow up with Dr. Loanne Drilling in 3-4 months and As needed

## 2019-09-06 MED FILL — XOLAIR 150 MG/ML SOSY: 150 | 28 days supply | Qty: 2 | Fill #1

## 2019-09-18 ENCOUNTER — Other Ambulatory Visit (HOSPITAL_COMMUNITY): Payer: Self-pay | Admitting: Internal Medicine

## 2019-09-18 MED FILL — IRBESARTAN 300 MG TAB: 300 | 90 days supply | Qty: 90 | Fill #1

## 2019-09-18 MED FILL — ATORVASTATIN 20 MG TABLET: 20 | 90 days supply | Qty: 90 | Fill #0

## 2019-09-24 NOTE — Progress Notes (Signed)
Cardiology Office Note:   Date:  09/25/2019  NAME:  Elaine Luna    MRN: [id number] DOB:  07-30-1945   PCP:  Shon Baton, MD  Cardiologist:  Evalina Field, MD   Referring MD: Shon Baton, MD   Chief Complaint  Patient presents with  . Follow-up   History of Present Illness:   Elaine Luna is a 74 y.o. female with a hx of PACs, HTN, DM who presents for follow-up.  We checked a BNP at her last visit was normal.  She had no evidence of heart failure.  She reports she still gets intermittently short of breath but symptoms last seconds.  Her asthma is well controlled.  Heart rate is still low but she has no symptoms from this.  Heart rate in the 40s today.  Her blood pressure is 122/70.  I recommend no change in medications.  Overall she seems to be doing well.  She denies chest pain, shortness of breath or palpitations.  Problem List 1. Covid 19 PNA -PE 2. Palpitations -PAC burden 3.4% 3. HTN 4. DM  Past Medical History: Past Medical History:  Diagnosis Date  . Asthma   . Colon polyps   . DM type 2 (diabetes mellitus, type 2) ([place]) 2015   . Hyperlipidemia   . Hypertension   . Obesity   . Pulmonary embolism associated with COVID-19 ([place]) 12/25/2018  . Tubular adenoma     Past Surgical History: Past Surgical History:  Procedure Laterality Date  . APPENDECTOMY    . CARDIAC CATHETERIZATION    . CATARACT EXTRACTION, BILATERAL  2011  . CHOLECYSTECTOMY    . ESOPHAGOGASTRODUODENOSCOPY  2011  . TONSILLECTOMY    . VESICOVAGINAL FISTULA CLOSURE W/ TAH      Current Medications: Current Meds  Medication Sig  . albuterol (PROVENTIL) (2.5 MG/3ML) 0.083% nebulizer solution USE 1 VIAL IN NEBULIZER EVERY 6 HOURS AS NEEDED FOR WHEEZING.  Marland Kitchen amLODipine (NORVASC) 10 MG tablet Take 1 tablet (10 mg total) by mouth daily.  Marland Kitchen atorvastatin (LIPITOR) 20 MG tablet Take 20 mg by mouth daily.  . beclomethasone (QVAR REDIHALER) 80 MCG/ACT inhaler INHALE 2 PUFFS BY MOUTH 2 (TWO) TIMES  DAILY.  Marland Kitchen Denosumab (PROLIA [place]) Inject into the skin every 6 (six) months.  . hydrochlorothiazide (MICROZIDE) 12.5 MG capsule Take 12.5 mg by mouth daily.  . irbesartan (AVAPRO) 300 MG tablet Take 300 mg by mouth daily.   . metFORMIN (GLUCOPHAGE-XR) 500 MG 24 hr tablet Take 1 tablet by mouth daily with breakfast. Currently taking only 3 times weekly  . mometasone-formoterol (DULERA) 200-5 MCG/ACT AERO INHALE 2 PUFFS BY MOUTH 2 TIMES DAILY.  . montelukast (SINGULAIR) 10 MG tablet TAKE 1 TABLET (10 MG TOTAL) BY MOUTH DAILY.  Marland Kitchen omalizumab (XOLAIR) 150 MG/ML prefilled syringe INJECT 300 MG INTO THE SKIN EVERY 28 (TWENTY-EIGHT) DAYS.  Marland Kitchen VENTOLIN HFA 108 (90 Base) MCG/ACT inhaler INHALE 1-2 PUFFS BY MOUTH 4 TIMES DAILY AS NEEDED  . Vitamin D, Ergocalciferol, (DRISDOL) 50000 UNITS CAPS Take 50,000 Units by mouth. Twice weekly   Current Facility-Administered Medications for the 09/25/19 encounter (Office Visit) with Geralynn Rile, MD  Medication  . omalizumab Arvid Right) injection 300 mg     Allergies:    Patient has no known allergies.   Social History: Social History   Socioeconomic History  . Marital status: Widowed    Spouse name: Not on file  . Number of children: 4  . Years of education: Not on file  .  Highest education level: Not on file  Occupational History  . Occupation: Programmer, multimedia: [employer]  Tobacco Use  . Smoking status: Former Smoker    Packs/day: 0.30    Years: 10.00    Pack years: 3.00    Types: Cigarettes    Start date: 73    Quit date: 02/03/1975    Years since quitting: 44.6  . Smokeless tobacco: Never Used  Substance and Sexual Activity  . Alcohol use: No  . Drug use: No  . Sexual activity: Not on file  Other Topics Concern  . Not on file  Social History Narrative  . Not on file   Social Determinants of Health   Financial Resource Strain:   . Difficulty of Paying Living Expenses: Not on file  Food Insecurity:   . Worried About Ship broker in the Last Year: Not on file  . Ran Out of Food in the Last Year: Not on file  Transportation Needs:   . Lack of Transportation (Medical): Not on file  . Lack of Transportation (Non-Medical): Not on file  Physical Activity:   . Days of Exercise per Week: Not on file  . Minutes of Exercise per Session: Not on file  Stress:   . Feeling of Stress : Not on file  Social Connections:   . Frequency of Communication with Friends and Family: Not on file  . Frequency of Social Gatherings with Friends and Family: Not on file  . Attends Religious Services: Not on file  . Active Member of Clubs or Organizations: Not on file  . Attends Archivist Meetings: Not on file  . Marital Status: Not on file     Family History: The patient's family history includes Asthma in an other family member; Diabetes in her brother, maternal grandfather, and maternal grandmother; Heart disease in her brother, father, and mother.  ROS:   All other ROS reviewed and negative. Pertinent positives noted in the HPI.     EKGs/Labs/Other Studies Reviewed:   The following studies were personally reviewed by me today:  TTE 11/17/2018 1. Left ventricular ejection fraction, by visual estimation, is 60 to  65%. The left ventricle has normal function. Normal left ventricular size.  There is no left ventricular hypertrophy.  2. Low normal GLS -14.8.  3. Global right ventricle has normal systolic function.The right  ventricular size is normal. No increase in right ventricular wall  thickness.  4. Left atrial size was normal.  5. Right atrial size was normal.  6. The mitral valve is normal in structure. Mild mitral valve  regurgitation. No evidence of mitral stenosis.  7. The tricuspid valve is normal in structure. Tricuspid valve  regurgitation was not visualized by color flow Doppler.  8. The aortic valve is normal in structure. Aortic valve regurgitation  was not visualized by color flow  Doppler. Structurally normal aortic  valve, with no evidence of sclerosis or stenosis.  9. The pulmonic valve was normal in structure. Pulmonic valve  regurgitation is not visualized by color flow Doppler.  10. Normal pulmonary artery systolic pressure.  11. The inferior vena cava is normal in size with greater than 50%  respiratory variability, suggesting right atrial pressure of 3 mmHg.   Zio 12/11/2018 1. Predominant rhythm was sinus bradycardia @ 49 bpm.  2. No significant arrhythmias to explain symptoms.  3. Isorhythmic dissociation present (benign finding).  4. Rare PACs and PVCs.      Recent Labs:  11/14/2018: BUN 22; Creatinine, Ser 1.27; Hemoglobin 12.6; Platelets 330; Potassium 4.0; Sodium 141; TSH 2.210 08/08/2019: BNP 98.7   Recent Lipid Panel No results found for: CHOL, TRIG, HDL, CHOLHDL, VLDL, LDLCALC, LDLDIRECT  Physical Exam:   VS:  BP 122/70   Pulse (!) 48   Ht 5\' 4"  (1.626 m)   Wt 162 lb (73.5 kg)   SpO2 99%   BMI 27.81 kg/m    Wt Readings from Last 3 Encounters:  09/25/19 162 lb (73.5 kg)  08/21/19 160 lb 6.4 oz (72.8 kg)  08/08/19 161 lb (73 kg)    General: Well nourished, well developed, in no acute distress Heart: Atraumatic, normal size  Eyes: PEERLA, EOMI  Neck: Supple, no JVD Endocrine: No thryomegaly Cardiac: Normal S1, S2; RRR; no murmurs, rubs, or gallops Lungs: Clear to auscultation bilaterally, no wheezing, rhonchi or rales  Abd: Soft, nontender, no hepatomegaly  Ext: No edema, pulses 2+ Musculoskeletal: No deformities, BUE and BLE strength normal and equal Skin: Warm and dry, no rashes   Neuro: Alert and oriented to person, place, time, and situation, CNII-XII grossly intact, no focal deficits  Psych: Normal mood and affect   ASSESSMENT:   Elaine Luna is a 74 y.o. female who presents for the following: 1. PAC (premature atrial contraction)   2. Sinus bradycardia   3. Palpitations   4. Essential hypertension     PLAN:   1.  PAC (premature atrial contraction) 2. Sinus bradycardia 3. Palpitations -She had 3.4% PVC burden on recent ZIO.  This was in the setting of recovering from Covid.  She seems to be doing well and has no symptoms of palpitations.  Due to her underlying sinus bradycardia I recommended no medications.  She seems to have no further symptoms.  We will continue to monitor this.  4. Essential hypertension -Blood pressure is 122/70.  She will continue her Norvasc 10 mg daily, Avapro 300 mg daily, HCTZ 12.5 mg daily.  Seems to be doing well on this regimen.  Disposition: Return in about 6 months (around 03/27/2020).  Medication Adjustments/Labs and Tests Ordered: Current medicines are reviewed at length with the patient today.  Concerns regarding medicines are outlined above.  No orders of the defined types were placed in this encounter.  No orders of the defined types were placed in this encounter.   Patient Instructions  Medication Instructions:  The current medical regimen is effective;  continue present plan and medications.  *If you need a refill on your cardiac medications before your next appointment, please call your pharmacy*   Follow-Up: At [facility], you and your health needs are our priority.  As part of our continuing mission to provide you with exceptional heart care, we have created designated Provider Care Teams.  These Care Teams include your primary Cardiologist (physician) and Advanced Practice Providers (APPs -  Physician Assistants and Nurse Practitioners) who all work together to provide you with the care you need, when you need it.  We recommend signing up for the patient portal called "MyChart".  Sign up information is provided on this After Visit Summary.  MyChart is used to connect with patients for Virtual Visits (Telemedicine).  Patients are able to view lab/test results, encounter notes, upcoming appointments, etc.  Non-urgent messages can be sent to your provider as  well.   To learn more about what you can do with MyChart, go to [email or web address].    Your next appointment:   6 month(s)  The format  for your next appointment:   In Person  Provider:   Eleonore Chiquito, MD         Time Spent with Patient: I have spent a total of 25 minutes with patient reviewing hospital notes, telemetry, EKGs, labs and examining the patient as well as establishing an assessment and plan that was discussed with the patient.  > 50% of time was spent in direct patient care.  Signed, Addison Naegeli. Audie Box, [facility]  [address] [place]  [place], [place] [address] [phone]  09/25/2019 10:49 AM

## 2019-09-25 ENCOUNTER — Encounter: Payer: Self-pay | Admitting: Cardiovascular Disease

## 2019-09-25 ENCOUNTER — Ambulatory Visit: Payer: 59 | Admitting: Cardiovascular Disease

## 2019-09-25 ENCOUNTER — Other Ambulatory Visit: Payer: Self-pay

## 2019-09-25 VITALS — BP 122/70 | HR 48 | Ht 64.0 in | Wt 162.0 lb

## 2019-09-25 DIAGNOSIS — R001 Bradycardia, unspecified: Secondary | ICD-10-CM

## 2019-09-25 DIAGNOSIS — I491 Atrial premature depolarization: Secondary | ICD-10-CM

## 2019-09-25 DIAGNOSIS — R002 Palpitations: Secondary | ICD-10-CM | POA: Diagnosis not present

## 2019-09-25 DIAGNOSIS — I10 Essential (primary) hypertension: Secondary | ICD-10-CM | POA: Diagnosis not present

## 2019-09-25 NOTE — Patient Instructions (Signed)
Medication Instructions:  The current medical regimen is effective;  continue present plan and medications.  *If you need a refill on your cardiac medications before your next appointment, please call your pharmacy*   Follow-Up: At [facility], you and your health needs are our priority.  As part of our continuing mission to provide you with exceptional heart care, we have created designated Provider Care Teams.  These Care Teams include your primary Cardiologist (physician) and Advanced Practice Providers (APPs -  Physician Assistants and Nurse Practitioners) who all work together to provide you with the care you need, when you need it.  We recommend signing up for the patient portal called "MyChart".  Sign up information is provided on this After Visit Summary.  MyChart is used to connect with patients for Virtual Visits (Telemedicine).  Patients are able to view lab/test results, encounter notes, upcoming appointments, etc.  Non-urgent messages can be sent to your provider as well.   To learn more about what you can do with MyChart, go to [email or web address].    Your next appointment:   6 month(s)  The format for your next appointment:   In Person  Provider:   Delmont O'Neal, MD      

## 2019-09-27 ENCOUNTER — Telehealth: Payer: 59 | Admitting: Cardiovascular Disease

## 2019-10-13 NOTE — Telephone Encounter (Signed)
Rachael please advise on message from patient  The Pharmacy says they can't refill my Xolair without prior authorization.  Can your office take care of this please? Thank you so much, Shon Baton

## 2019-10-13 NOTE — Telephone Encounter (Signed)
Received notification from [employer] regarding a prior authorization for XOLAIR. Authorization has been APPROVED from 10/13/19 to 10/11/20.   Authorization # [id number].

## 2019-10-13 NOTE — Telephone Encounter (Signed)
Submitted a Urgent Prior Authorization request to [facility] for Arvid Right via Cover My Meds. Will update once we receive a response. Turn around can be up to 24 hours.   (Key: [id number]

## 2019-10-16 MED FILL — XOLAIR 150 MG/ML SOSY: 150 | 28 days supply | Qty: 2 | Fill #2

## 2019-10-19 DIAGNOSIS — Z23 Encounter for immunization: Secondary | ICD-10-CM | POA: Diagnosis not present

## 2019-10-19 DIAGNOSIS — E1122 Type 2 diabetes mellitus with diabetic chronic kidney disease: Secondary | ICD-10-CM | POA: Diagnosis not present

## 2019-10-19 DIAGNOSIS — I700 Atherosclerosis of aorta: Secondary | ICD-10-CM | POA: Diagnosis not present

## 2019-10-19 DIAGNOSIS — N1832 Chronic kidney disease, stage 3b: Secondary | ICD-10-CM | POA: Diagnosis not present

## 2019-10-19 DIAGNOSIS — J45909 Unspecified asthma, uncomplicated: Secondary | ICD-10-CM | POA: Diagnosis not present

## 2019-10-19 DIAGNOSIS — I2699 Other pulmonary embolism without acute cor pulmonale: Secondary | ICD-10-CM | POA: Diagnosis not present

## 2019-10-19 DIAGNOSIS — I129 Hypertensive chronic kidney disease with stage 1 through stage 4 chronic kidney disease, or unspecified chronic kidney disease: Secondary | ICD-10-CM | POA: Diagnosis not present

## 2019-10-19 DIAGNOSIS — Z7901 Long term (current) use of anticoagulants: Secondary | ICD-10-CM | POA: Diagnosis not present

## 2019-10-19 DIAGNOSIS — E669 Obesity, unspecified: Secondary | ICD-10-CM | POA: Diagnosis not present

## 2019-10-30 ENCOUNTER — Other Ambulatory Visit (HOSPITAL_COMMUNITY): Payer: Self-pay | Admitting: Internal Medicine

## 2019-10-30 ENCOUNTER — Other Ambulatory Visit: Payer: Self-pay | Admitting: Pulmonary Disease

## 2019-10-30 MED FILL — AMLODIPINE BESYLATE 10 MG T: 10 | 90 days supply | Qty: 90 | Fill #1

## 2019-10-30 MED FILL — VIT D2 1.25 MG (50,000 UNIT: 1.25 MG | 84 days supply | Qty: 24 | Fill #0

## 2019-10-30 MED FILL — QVAR REDIHALER 80 MCG/ACT A: 80 | 30 days supply | Qty: 11 | Fill #2

## 2019-10-30 MED FILL — MONTELUKAST SOD 10 MG TAB: 10 | 90 days supply | Qty: 90 | Fill #0

## 2019-11-08 ENCOUNTER — Encounter (HOSPITAL_COMMUNITY): Payer: 59

## 2019-11-10 ENCOUNTER — Other Ambulatory Visit (HOSPITAL_COMMUNITY): Payer: Self-pay

## 2019-11-13 ENCOUNTER — Other Ambulatory Visit: Payer: Self-pay

## 2019-11-13 ENCOUNTER — Encounter (HOSPITAL_COMMUNITY)
Admission: RE | Admit: 2019-11-13 | Discharge: 2019-11-13 | Disposition: A | Discharge status: Home / Self Care | Payer: 59 | Source: Ambulatory Visit | Attending: Internal Medicine | Admitting: Internal Medicine

## 2019-11-13 DIAGNOSIS — M810 Age-related osteoporosis without current pathological fracture: Secondary | ICD-10-CM | POA: Diagnosis not present

## 2019-11-13 MED ORDER — DENOSUMAB 60 MG/ML ~~LOC~~ SOSY
60.0000 mg | PREFILLED_SYRINGE | Freq: Once | SUBCUTANEOUS | Status: AC
  Administered 2019-11-13: 60 mg via SUBCUTANEOUS

## 2019-11-13 MED ORDER — DENOSUMAB 60 MG/ML ~~LOC~~ SOSY
PREFILLED_SYRINGE | SUBCUTANEOUS | Status: AC
  Filled 2019-11-13: qty 1

## 2019-11-13 MED FILL — XOLAIR 150 MG/ML SOSY: 150 | 28 days supply | Qty: 2 | Fill #3

## 2019-11-27 MED FILL — HYDROCHLOROTHIAZIDE 25 MG T: 25 | 90 days supply | Qty: 90 | Fill #3

## 2019-12-19 MED FILL — ATORVASTATIN 20 MG TABLET: 20 | 90 days supply | Qty: 90 | Fill #1

## 2019-12-19 MED FILL — IRBESARTAN 300 MG TABLET: 300 | 90 days supply | Qty: 90 | Fill #2

## 2019-12-20 MED FILL — XOLAIR 150 MG/ML SOSY: 150 | 28 days supply | Qty: 2 | Fill #4

## 2020-01-09 DIAGNOSIS — Z0000 Encounter for general adult medical examination without abnormal findings: Secondary | ICD-10-CM | POA: Diagnosis not present

## 2020-01-09 DIAGNOSIS — E1122 Type 2 diabetes mellitus with diabetic chronic kidney disease: Secondary | ICD-10-CM | POA: Diagnosis not present

## 2020-01-09 DIAGNOSIS — E785 Hyperlipidemia, unspecified: Secondary | ICD-10-CM | POA: Diagnosis not present

## 2020-01-10 DIAGNOSIS — R7989 Other specified abnormal findings of blood chemistry: Secondary | ICD-10-CM | POA: Diagnosis not present

## 2020-01-15 ENCOUNTER — Other Ambulatory Visit: Payer: Self-pay | Admitting: Adult Health

## 2020-01-15 MED FILL — XOLAIR 150 MG/ML SOSY: 150 | 28 days supply | Qty: 2 | Fill #5

## 2020-01-16 ENCOUNTER — Other Ambulatory Visit: Payer: Self-pay | Admitting: Adult Health

## 2020-01-16 DIAGNOSIS — N1832 Chronic kidney disease, stage 3b: Secondary | ICD-10-CM | POA: Diagnosis not present

## 2020-01-16 DIAGNOSIS — R82998 Other abnormal findings in urine: Secondary | ICD-10-CM | POA: Diagnosis not present

## 2020-01-16 DIAGNOSIS — M810 Age-related osteoporosis without current pathological fracture: Secondary | ICD-10-CM | POA: Diagnosis not present

## 2020-01-16 DIAGNOSIS — Z0000 Encounter for general adult medical examination without abnormal findings: Secondary | ICD-10-CM | POA: Diagnosis not present

## 2020-01-16 DIAGNOSIS — J45909 Unspecified asthma, uncomplicated: Secondary | ICD-10-CM | POA: Diagnosis not present

## 2020-01-16 DIAGNOSIS — F4321 Adjustment disorder with depressed mood: Secondary | ICD-10-CM | POA: Diagnosis not present

## 2020-01-16 DIAGNOSIS — I129 Hypertensive chronic kidney disease with stage 1 through stage 4 chronic kidney disease, or unspecified chronic kidney disease: Secondary | ICD-10-CM | POA: Diagnosis not present

## 2020-01-16 DIAGNOSIS — E785 Hyperlipidemia, unspecified: Secondary | ICD-10-CM | POA: Diagnosis not present

## 2020-01-16 DIAGNOSIS — Z1212 Encounter for screening for malignant neoplasm of rectum: Secondary | ICD-10-CM | POA: Diagnosis not present

## 2020-01-16 DIAGNOSIS — I700 Atherosclerosis of aorta: Secondary | ICD-10-CM | POA: Diagnosis not present

## 2020-01-16 DIAGNOSIS — E1122 Type 2 diabetes mellitus with diabetic chronic kidney disease: Secondary | ICD-10-CM | POA: Diagnosis not present

## 2020-01-16 DIAGNOSIS — I10 Essential (primary) hypertension: Secondary | ICD-10-CM | POA: Diagnosis not present

## 2020-01-16 DIAGNOSIS — R7989 Other specified abnormal findings of blood chemistry: Secondary | ICD-10-CM | POA: Diagnosis not present

## 2020-01-22 ENCOUNTER — Telehealth: Payer: Self-pay

## 2020-01-22 MED FILL — ALBUTEROL SULFATE HFA 108 (: 108 (90 BAS | 25 days supply | Qty: 18 | Fill #0

## 2020-01-22 NOTE — Telephone Encounter (Signed)
Received a PA request for Ventolin HFA.  Unable to reach pharmacist directly, left a voicemail asking to prescribe the insurance preferred brand of albuterol.  Nothing further needed at this time- will close encounter.

## 2020-01-23 DIAGNOSIS — N1832 Chronic kidney disease, stage 3b: Secondary | ICD-10-CM | POA: Diagnosis not present

## 2020-01-30 ENCOUNTER — Emergency Department (HOSPITAL_COMMUNITY): Payer: 59

## 2020-01-30 ENCOUNTER — Encounter (HOSPITAL_COMMUNITY): Payer: Self-pay

## 2020-01-30 ENCOUNTER — Emergency Department (HOSPITAL_COMMUNITY)
Admission: EM | Admit: 2020-01-30 | Discharge: 2020-01-30 | Disposition: A | Discharge status: Home / Self Care | Payer: 59 | Attending: Emergency Medicine | Admitting: Emergency Medicine

## 2020-01-30 ENCOUNTER — Other Ambulatory Visit: Payer: Self-pay

## 2020-01-30 DIAGNOSIS — Y9259 Other trade areas as the place of occurrence of the external cause: Secondary | ICD-10-CM | POA: Insufficient documentation

## 2020-01-30 DIAGNOSIS — Z79899 Other long term (current) drug therapy: Secondary | ICD-10-CM | POA: Insufficient documentation

## 2020-01-30 DIAGNOSIS — S0990XA Unspecified injury of head, initial encounter: Secondary | ICD-10-CM | POA: Diagnosis present

## 2020-01-30 DIAGNOSIS — Z043 Encounter for examination and observation following other accident: Secondary | ICD-10-CM | POA: Diagnosis not present

## 2020-01-30 DIAGNOSIS — W19XXXA Unspecified fall, initial encounter: Secondary | ICD-10-CM

## 2020-01-30 DIAGNOSIS — E119 Type 2 diabetes mellitus without complications: Secondary | ICD-10-CM | POA: Diagnosis not present

## 2020-01-30 DIAGNOSIS — Z87891 Personal history of nicotine dependence: Secondary | ICD-10-CM | POA: Diagnosis not present

## 2020-01-30 DIAGNOSIS — R58 Hemorrhage, not elsewhere classified: Secondary | ICD-10-CM | POA: Diagnosis not present

## 2020-01-30 DIAGNOSIS — I10 Essential (primary) hypertension: Secondary | ICD-10-CM | POA: Diagnosis not present

## 2020-01-30 DIAGNOSIS — S60222A Contusion of left hand, initial encounter: Secondary | ICD-10-CM | POA: Insufficient documentation

## 2020-01-30 DIAGNOSIS — S0101XA Laceration without foreign body of scalp, initial encounter: Secondary | ICD-10-CM | POA: Diagnosis not present

## 2020-01-30 DIAGNOSIS — W2201XA Walked into wall, initial encounter: Secondary | ICD-10-CM | POA: Diagnosis not present

## 2020-01-30 DIAGNOSIS — S0191XA Laceration without foreign body of unspecified part of head, initial encounter: Secondary | ICD-10-CM

## 2020-01-30 DIAGNOSIS — J45909 Unspecified asthma, uncomplicated: Secondary | ICD-10-CM | POA: Insufficient documentation

## 2020-01-30 DIAGNOSIS — S0181XA Laceration without foreign body of other part of head, initial encounter: Secondary | ICD-10-CM | POA: Diagnosis not present

## 2020-01-30 DIAGNOSIS — R52 Pain, unspecified: Secondary | ICD-10-CM | POA: Diagnosis not present

## 2020-01-30 MED ORDER — LIDOCAINE-EPINEPHRINE 1 %-1:100000 IJ SOLN
10.0000 mL | Freq: Once | INTRAMUSCULAR | Status: AC
  Administered 2020-01-30: 10 mL
  Filled 2020-01-30: qty 1

## 2020-01-30 NOTE — ED Provider Notes (Signed)
[facility] EMERGENCY DEPARTMENT Provider Note   CSN: [id number] Arrival date & time: 01/30/20  0957     History Chief Complaint  Patient presents with  . Fall  . Head Laceration    Elaine Luna is a 74 y.o. female.  Patient presents the emergency department for evaluation of fall and head laceration.  Patient was walking to an ATM and she tripped over the lip of concrete.  She fell forward and caught herself on her hands and struck the top of her head on the wall.  No loss of consciousness.  She had bleeding and pressure was applied.  Patient complains of pain in her left hand as well as a headache.  No vomiting, confusion, or repetitive questioning per patient or EMS.  She denies neck pain, numbness, weakness, or tingling in her arms or legs.  No other complaints. Tetanus updated.         Past Medical History:  Diagnosis Date  . Asthma   . Colon polyps   . DM type 2 (diabetes mellitus, type 2) ([place]) 2015   . Hyperlipidemia   . Hypertension   . Obesity   . Pulmonary embolism associated with COVID-19 ([place]) 12/25/2018  . Tubular adenoma     Patient Active Problem List   Diagnosis Date Noted  . Bradycardia 11/10/2018  . Allergic rhinitis 01/18/2017  . Pulmonary nodule 03/08/2015  . DM2 (diabetes mellitus, type 2) ([place]) 07/26/2013  . DYSPHAGIA 12/31/2008  . PERSONAL HX COLONIC POLYPS 12/31/2008  . Severe persistent asthma 04/21/2007  . HYPERLIPIDEMIA 10/24/2006  . HYPERTENSION 10/24/2006    Past Surgical History:  Procedure Laterality Date  . APPENDECTOMY    . CARDIAC CATHETERIZATION    . CATARACT EXTRACTION, BILATERAL  2011  . CHOLECYSTECTOMY    . ESOPHAGOGASTRODUODENOSCOPY  2011  . TONSILLECTOMY    . VESICOVAGINAL FISTULA CLOSURE W/ TAH       OB History   No obstetric history on file.     Family History  Problem Relation Age of Onset  . Asthma Other        all children  . Diabetes Maternal Grandfather   . Diabetes Maternal  Grandmother   . Diabetes Brother   . Heart disease Mother   . Heart disease Brother   . Heart disease Father     Social History   Tobacco Use  . Smoking status: Former Smoker    Packs/day: 0.30    Years: 10.00    Pack years: 3.00    Types: Cigarettes    Start date: 71    Quit date: 02/03/1975    Years since quitting: 45.0  . Smokeless tobacco: Never Used  Substance Use Topics  . Alcohol use: No  . Drug use: No    Home Medications Prior to Admission medications   Medication Sig Start Date End Date Taking? Authorizing Provider  albuterol (PROVENTIL) (2.5 MG/3ML) 0.083% nebulizer solution USE 1 VIAL IN NEBULIZER EVERY 6 HOURS AS NEEDED FOR WHEEZING. 03/14/18   Juanito Doom, MD  amLODipine (NORVASC) 10 MG tablet Take 1 tablet (10 mg total) by mouth daily. 08/08/19 11/06/19  O'NealCassie Freer, MD  atorvastatin (LIPITOR) 20 MG tablet Take 20 mg by mouth daily.    [provider]  beclomethasone (QVAR REDIHALER) 80 MCG/ACT inhaler INHALE 2 PUFFS BY MOUTH 2 (TWO) TIMES DAILY. 11/10/18   Margaretha Seeds, MD  Denosumab (PROLIA Kennedy) Inject into the skin every 6 (six) months.  [provider]  hydrochlorothiazide (MICROZIDE) 12.5 MG capsule Take 12.5 mg by mouth daily.    [provider]  irbesartan (AVAPRO) 300 MG tablet Take 300 mg by mouth daily.  11/15/18   [provider]  metFORMIN (GLUCOPHAGE-XR) 500 MG 24 hr tablet Take 1 tablet by mouth daily with breakfast. Currently taking only 3 times weekly    [provider]  mometasone-formoterol (DULERA) 200-5 MCG/ACT AERO INHALE 2 PUFFS BY MOUTH 2 TIMES DAILY. 05/10/19   Margaretha Seeds, MD  montelukast (SINGULAIR) 10 MG tablet TAKE 1 TABLET (10 MG TOTAL) BY MOUTH DAILY. 10/30/19   Margaretha Seeds, MD  omalizumab Arvid Right) 150 MG/ML prefilled syringe INJECT 300 MG INTO THE SKIN EVERY 28 (TWENTY-EIGHT) DAYS. 07/20/19   Tresa Garter, MD  VENTOLIN HFA 108 (90 Base) MCG/ACT inhaler  INHALE 1-2 PUFFS BY MOUTH 4 TIMES DAILY AS NEEDED 01/16/20   Parrett, Fonnie Mu, NP  Vitamin D, Ergocalciferol, (DRISDOL) 50000 UNITS CAPS Take 50,000 Units by mouth. Twice weekly    [provider]    Allergies    Patient has no known allergies.  Review of Systems   Review of Systems  Constitutional: Negative for fever.  HENT: Negative for rhinorrhea and sore throat.   Eyes: Negative for redness.  Respiratory: Negative for cough.   Cardiovascular: Negative for chest pain.  Gastrointestinal: Negative for abdominal pain, diarrhea, nausea and vomiting.  Genitourinary: Negative for dysuria, frequency, hematuria and urgency.  Musculoskeletal: Positive for arthralgias. Negative for myalgias.  Skin: Positive for wound. Negative for rash.  Neurological: Positive for headaches.    Physical Exam Updated Vital Signs BP (!) 168/58 (BP Location: Right Arm)   Pulse 61   Resp 16   Ht _0  (1.626 m)   Wt 69.9 kg   SpO2 100%   BMI 26.43 kg/m   Physical Exam Vitals and nursing note reviewed.  Constitutional:      General: She is not in acute distress.    Appearance: She is well-developed and well-nourished.  HENT:     Head: Normocephalic. No raccoon eyes or Battle's sign.     Comments: 4cm linear, vertically oriented, clean, hemostatic laceration to the left forehead.     Right Ear: Tympanic membrane, ear canal and external ear normal. No hemotympanum.     Left Ear: Tympanic membrane, ear canal and external ear normal. No hemotympanum.     Nose: Nose normal. No nasal septal hematoma.     Mouth/Throat:     Mouth: Oropharynx is clear and moist and mucous membranes are normal.     Pharynx: Uvula midline.  Eyes:     General: Lids are normal.     Extraocular Movements: EOM normal.     Right eye: No nystagmus.     Left eye: No nystagmus.     Conjunctiva/sclera: Conjunctivae normal.     Pupils: Pupils are equal, round, and reactive to light.     Comments: No visible hyphema noted   Cardiovascular:     Rate and Rhythm: Normal rate and regular rhythm.     Heart sounds: No murmur heard.   Pulmonary:     Effort: Pulmonary effort is normal. No respiratory distress.     Breath sounds: Normal breath sounds. No wheezing, rhonchi or rales.  Abdominal:     Palpations: Abdomen is soft.     Tenderness: There is no abdominal tenderness. There is no guarding or rebound.  Musculoskeletal:     Cervical back:  Normal range of motion and neck supple. No tenderness or bony tenderness. Normal range of motion.     Thoracic back: No tenderness or bony tenderness.     Lumbar back: No tenderness or bony tenderness.     Right lower leg: No edema.     Left lower leg: No edema.  Skin:    General: Skin is warm and dry.     Findings: No rash.  Neurological:     General: No focal deficit present.     Mental Status: She is alert and oriented to person, place, and time. Mental status is at baseline.     GCS: GCS eye subscore is 4. GCS verbal subscore is 5. GCS motor subscore is 6.     Cranial Nerves: No cranial nerve deficit.     Sensory: No sensory deficit.     Motor: No weakness.     Coordination: Coordination normal.     Deep Tendon Reflexes: Strength normal and reflexes are normal and symmetric.  Psychiatric:        Mood and Affect: Mood and affect and mood normal.     ED Results / Procedures / Treatments   Labs (all labs ordered are listed, but only abnormal results are displayed) Labs Reviewed - No data to display  EKG None  Radiology CT Head Wo Contrast  Result Date: 01/30/2020 CLINICAL DATA:  74 year old female status post fall with forehead laceration. EXAM: CT HEAD WITHOUT CONTRAST TECHNIQUE: Contiguous axial images were obtained from the base of the skull through the vertex without intravenous contrast. COMPARISON:  Paranasal sinus CT 09/01/2005. FINDINGS: Brain: Asymmetric hyperdensity at the medial left basal ganglia is favored to be dystrophic calcification on  series 3, image 14. There is no surrounding edema, mass effect. Elsewhere gray-white matter differentiation is within normal limits for age. Cerebral volume is within normal limits for age. No midline shift, ventriculomegaly, mass effect, evidence of mass lesion, intracranial hemorrhage or evidence of cortically based acute infarction. Vascular: Calcified atherosclerosis at the skull base. No suspicious intracranial vascular hyperdensity. Skull: No fracture identified.  Osteopenia at the skull base. Sinuses/Orbits: Visualized paranasal sinuses and mastoids are stable and well pneumatized. Other: Left forehead scalp hematoma/contusion (series 4, image 32). And superior to that there is a prominent soft tissue laceration which extends to near the skull periosteum (series 4, image 50). Underlying left frontal bone intact. Other scalp and orbits soft tissues appear negative. IMPRESSION: 1. Deep laceration of the left scalp without underlying skull fracture. 2. Negative for age non contrast CT appearance of the brain. Electronically Signed   By: Genevie Ann M.D.   On: 01/30/2020 10:47   CT Cervical Spine Wo Contrast  Result Date: 01/30/2020 CLINICAL DATA:  74 year old female status post fall with forehead laceration. EXAM: CT CERVICAL SPINE WITHOUT CONTRAST TECHNIQUE: Multidetector CT imaging of the cervical spine was performed without intravenous contrast. Multiplanar CT image reconstructions were also generated. COMPARISON:  Head CT today reported separately. Cervical spine radiographs 05/04/2007. Chest CT 03/07/2015. FINDINGS: Alignment: Chronic straightening of cervical lordosis. Cervicothoracic junction alignment is within normal limits. Bilateral posterior element alignment is within normal limits. Skull base and vertebrae: Visualized skull base is intact. No atlanto-occipital dissociation. Osteopenia. No acute osseous abnormality identified. Soft tissues and spinal canal: No prevertebral fluid or swelling. No  visible canal hematoma. Calcified cervical carotid atherosclerosis greater on the right. Thyroid is negative for age (chronic left lobe hypodense nodule not significantly changed from chest CT 03/07/2015), full no follow-up  recommended (ref: J Am Coll Radiol. 2015 Feb;12(2): 143-50). Disc levels: Widespread cervical disc and endplate degeneration. Mild multilevel cervical spinal stenosis suspected at C4-C5 and C5-C6. Upper chest: Chronic mild apical lung scarring. Visible upper thoracic levels appear intact. IMPRESSION: 1. No acute traumatic injury identified in the cervical spine. 2. Cervical spine degeneration with mild spinal stenosis suspected at C4-C5 and C5-C6. Electronically Signed   By: Genevie Ann M.D.   On: 01/30/2020 10:51   DG Hand Complete Left  Result Date: 01/30/2020 CLINICAL DATA:  Fall on outstretched hand. EXAM: LEFT HAND - COMPLETE 3+ VIEW COMPARISON:  No prior. FINDINGS: Diffuse degenerative change, most prominent about the first carpometacarpal joint. No acute bony or joint abnormality identified. No evidence of fracture or dislocation. IMPRESSION: Diffuse degenerative change, most prominent about the first carpometacarpal joint. No acute abnormality identified. Electronically Signed   By: Marcello Moores  Register   On: 01/30/2020 10:33    Procedures .Marland KitchenLaceration Repair  Date/Time: 01/30/2020 12:49 PM Performed by: Carlisle Cater, PA-C Authorized by: Carlisle Cater, PA-C   Consent:    Consent obtained:  Verbal   Consent given by:  Patient   Risks discussed:  Infection, pain, poor cosmetic result, nerve damage and poor wound healing   Alternatives discussed:  No treatment Universal protocol:    Patient identity confirmed:  Verbally with patient, arm band, provided demographic data and hospital-assigned identification number Anesthesia:    Anesthesia method:  Local infiltration   Local anesthetic:  Lidocaine 2% WITH epi Laceration details:    Location:  Scalp   Scalp location:   Frontal   Length (cm):  4 Pre-procedure details:    Preparation:  Patient was prepped and draped in usual sterile fashion Exploration:    Limited defect created (wound extended): no     Hemostasis achieved with:  Direct pressure and epinephrine   Imaging obtained: x-ray     Imaging outcome: foreign body not noted     Wound exploration: wound explored through full range of motion and entire depth of wound visualized     Wound extent: no foreign bodies/material noted and no underlying fracture noted   Treatment:    Area cleansed with:  Saline   Amount of cleaning:  Extensive   Visualized foreign bodies/material removed: no     Debridement:  None Skin repair:    Repair method:  Sutures   Suture size:  5-0   Suture material:  Nylon   Suture technique:  Simple interrupted   Number of sutures:  7 Approximation:    Approximation:  Close Repair type:    Repair type:  Simple Post-procedure details:    Dressing:  Open (no dressing)   Procedure completion:  Tolerated well, no immediate complications   (including critical care time)  Medications Ordered in ED Medications  lidocaine-EPINEPHrine (XYLOCAINE W/EPI) 1 %-1:100000 (with pres) injection 10 mL (10 mLs Infiltration Given by Other 01/30/20 1052)    ED Course  I have reviewed the triage vital signs and the nursing notes.  Pertinent labs & imaging results that were available during my care of the patient were reviewed by me and considered in my medical decision making (see chart for details).  Patient seen and examined. Work-up initiated. Medications ordered. Tetanus updated.   Vital signs reviewed and are as follows: BP (!) 168/58 (BP Location: Right Arm)   Pulse 61   Resp 16   Ht _0  (1.626 m)   Wt 69.9 kg   SpO2 100%  BMI 26.43 kg/m   12:45 PM patient without any decompensation during ED stay.  She was updated on CT imaging results.  Wound was then anesthetized, cleaned, and closed as above.  Patient tolerated  well.  Patient counseled on wound care. Patient counseled on need to return or see PCP/urgent care for suture removal in 7 days. Patient was urged to return to the Emergency Department urgently with worsening pain, swelling, expanding erythema especially if it streaks away from the affected area, fever, or if they have any other concerns. Patient verbalized understanding.   Patient was counseled on head injury precautions and symptoms that should indicate their return to the ED.  These include severe worsening headache, vision changes, confusion, loss of consciousness, trouble walking, nausea & vomiting, or weakness/tingling in extremities.      MDM Rules/Calculators/A&P                          Laceration: Clean and linear, repaired without any complications.  Minor head injury/fall: Mechanical in nature.  CT imaging of the head, cervical spine negative.  Patient at baseline during ED stay and prior to discharge.  No obvious signs of a concussion.  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  Hand contusion: Imaging negative.  Encouraged rice protocol.   Final Clinical Impression(s) / ED Diagnoses Final diagnoses:  Fall, initial encounter  Contusion of left hand, initial encounter  Laceration of head without foreign body, unspecified part of head, initial encounter    Rx / DC Orders ED Discharge Orders    None       Carlisle Cater, PA-C 01/30/20 1251    Horton, Barbette Hair, MD 01/30/20 1540

## 2020-01-30 NOTE — ED Notes (Signed)
Pt transported to imaging at this time

## 2020-01-30 NOTE — Discharge Instructions (Signed)
Please read and follow all provided instructions.  Your diagnoses today include:  1. Traumatic head injury with multiple lacerations, initial encounter   2. Fall, initial encounter   3. Contusion of left hand, initial encounter     Tests performed today include:  CT scan of your head and cervical spine that showed a deep laceration but did not show any other serious injury.  X-ray of the hand - no broken bones  Vital signs. See below for your results today.   Medications prescribed:   None  Take any prescribed medications only as directed.  Home care instructions:  Follow any educational materials contained in this packet.  BE VERY CAREFUL not to take multiple medicines containing Tylenol (also called acetaminophen). Doing so can lead to an overdose which can damage your liver and cause liver failure and possibly death.   Follow-up instructions: Please follow-up with your primary care provider in 7 days to have the sutures checked and removed.   Return instructions:  SEEK IMMEDIATE MEDICAL ATTENTION IF:  There is confusion or drowsiness (although children frequently become drowsy after injury).   You cannot awaken the injured person.   You have more than one episode of vomiting.   You notice dizziness or unsteadiness which is getting worse, or inability to walk.   You have convulsions or unconsciousness.   You experience severe, persistent headaches not relieved by Tylenol.  You cannot use arms or legs normally.   There are changes in pupil sizes. (This is the black center in the colored part of the eye)   There is clear or bloody discharge from the nose or ears.   You have change in speech, vision, swallowing, or understanding.   Localized weakness, numbness, tingling, or change in bowel or bladder control.  You have any other emergent concerns.  Additional Information: You have had a head injury which does not appear to require admission at this  time.  Your vital signs today were: BP (!) 156/79   Pulse (!) 59   Temp 97.7 F (36.5 C) (Oral)   Resp 17   Ht 5\' 4"  (1.626 m)   Wt 69.9 kg   SpO2 100%   BMI 26.43 kg/m  If your blood pressure (BP) was elevated above 135/85 this visit, please have this repeated by your doctor within one month. --------------

## 2020-01-30 NOTE — ED Triage Notes (Signed)
Pt arrived to ED via EMS from [facility] after a mechanical fall. Pt tripped on concrete and hit her head on part of the ATM resulting in an approximated 3cm vertical laceration to head. No LOC, no blood thinners. Pt broke fall w/ L arm and is also c/o L hand pain.

## 2020-02-06 DIAGNOSIS — R519 Headache, unspecified: Secondary | ICD-10-CM | POA: Diagnosis not present

## 2020-02-06 DIAGNOSIS — Z4802 Encounter for removal of sutures: Secondary | ICD-10-CM | POA: Diagnosis not present

## 2020-02-06 DIAGNOSIS — S0101XS Laceration without foreign body of scalp, sequela: Secondary | ICD-10-CM | POA: Diagnosis not present

## 2020-02-13 MED FILL — XOLAIR 150 MG/ML SOSY: 150 | 28 days supply | Qty: 2 | Fill #6

## 2020-02-21 MED FILL — AMLODIPINE BESYLATE 10 MG T: 10 | 90 days supply | Qty: 90 | Fill #2

## 2020-02-21 MED FILL — VIT D2 1.25 MG (50,000 UNIT: 1.25 MG | 84 days supply | Qty: 24 | Fill #1

## 2020-03-08 ENCOUNTER — Other Ambulatory Visit: Payer: Self-pay | Admitting: Pulmonary Disease

## 2020-03-08 MED FILL — HYDROCHLOROTHIAZIDE 25 MG T: 25 | 90 days supply | Qty: 90 | Fill #0

## 2020-03-08 MED FILL — MONTELUKAST SOD 10 MG TAB: 10 | 90 days supply | Qty: 90 | Fill #0

## 2020-03-11 MED FILL — XOLAIR 150 MG/ML SOSY: 150 | 28 days supply | Qty: 2 | Fill #7

## 2020-03-15 NOTE — Telephone Encounter (Signed)
Patient sent email  I'm in the process of securing medical insurance coverage due to upcoming retirement.  The pharmacy plans I'm looking at do not cover My monthly Xolair injections or Qvar and Dulera inhalers.  Are there drugs available that can be substituted?  We can discuss. Thank you, Elaine Luna    Dr. Loanne Drilling please advise.

## 2020-03-18 NOTE — Telephone Encounter (Signed)
Called patient and discussed Xolair patient assistance. Will mail forms and patient will return to office to start process once her Medicare plan is active.

## 2020-03-24 NOTE — Progress Notes (Signed)
Cardiology Office Note:   Date:  03/26/2020  NAME:  Elaine Luna    MRN: [id number] DOB:  04-11-1945   PCP:  Shon Baton, MD  Cardiologist:  Evalina Field, MD   Referring MD: Shon Baton, MD   Chief Complaint  Patient presents with  . Bradycardia   History of Present Illness:   Elaine Luna is a 75 y.o. female with a hx of PACs, palpitations, HTN, DM who presents for follow-up.  She is doing well since her last visit.  Still bradycardic but no symptoms from this.  She denies any chest pain or shortness of breath.  Asthma symptoms are stable.  BP 120/60.  She reports that he will retire soon.  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  She has been asked to reassess this by her daughter.  Overall seems to be doing well.  Cholesterol level within normal limits.  All of her testing for bradycardia and shortness of breath were negative for cardiac source.  Problem List 1. Covid 19 PNA 2. PE 11/14/2018 3. Palpitations -PAC burden 3.4% 4. HTN 5. DM -A1c 5.5 -0 coronary calcium on CT PE study  Past Medical History: Past Medical History:  Diagnosis Date  . Asthma   . Colon polyps   . DM type 2 (diabetes mellitus, type 2) ([place]) 2015   . Hyperlipidemia   . Hypertension   . Obesity   . Pulmonary embolism associated with COVID-19 ([place]) 12/25/2018  . Tubular adenoma     Past Surgical History: Past Surgical History:  Procedure Laterality Date  . APPENDECTOMY    . CARDIAC CATHETERIZATION    . CATARACT EXTRACTION, BILATERAL  2011  . CHOLECYSTECTOMY    . ESOPHAGOGASTRODUODENOSCOPY  2011  . TONSILLECTOMY    . VESICOVAGINAL FISTULA CLOSURE W/ TAH      Current Medications: Current Meds  Medication Sig  . albuterol (PROVENTIL) (2.5 MG/3ML) 0.083% nebulizer solution USE 1 VIAL IN NEBULIZER EVERY 6 HOURS AS NEEDED FOR WHEEZING.  Marland Kitchen amLODipine (NORVASC) 10 MG tablet Take 1 tablet (10 mg total) by mouth daily.  Marland Kitchen atorvastatin (LIPITOR) 20 MG tablet Take 20 mg by  mouth daily.  . beclomethasone (QVAR REDIHALER) 80 MCG/ACT inhaler INHALE 2 PUFFS BY MOUTH INTO THE LUNGS 2 (TWO) TIMES DAILY.  Marland Kitchen Denosumab (PROLIA [place]) Inject into the skin every 6 (six) months.  . hydrochlorothiazide (MICROZIDE) 12.5 MG capsule Take 12.5 mg by mouth daily.  . irbesartan (AVAPRO) 300 MG tablet Take 300 mg by mouth daily.   . metFORMIN (GLUCOPHAGE-XR) 500 MG 24 hr tablet Take 1 tablet by mouth daily with breakfast. Currently taking only 3 times weekly  . mometasone-formoterol (DULERA) 200-5 MCG/ACT AERO INHALE 2 PUFFS BY MOUTH 2 TIMES DAILY.  . montelukast (SINGULAIR) 10 MG tablet TAKE 1 TABLET (10 MG TOTAL) BY MOUTH DAILY.  Marland Kitchen omalizumab (XOLAIR) 150 MG/ML prefilled syringe INJECT 300 MG INTO THE SKIN EVERY 28 (TWENTY-EIGHT) DAYS.  Marland Kitchen VENTOLIN HFA 108 (90 Base) MCG/ACT inhaler INHALE 1-2 PUFFS BY MOUTH 4 TIMES DAILY AS NEEDED  . Vitamin D, Ergocalciferol, (DRISDOL) 50000 UNITS CAPS Take 50,000 Units by mouth. Twice weekly   Current Facility-Administered Medications for the 03/26/20 encounter (Office Visit) with Geralynn Rile, MD  Medication  . omalizumab Arvid Right) injection 300 mg     Allergies:    Patient has no known allergies.   Social History: Social History   Socioeconomic History  . Marital status: Widowed  Spouse name: Not on file  . Number of children: 4  . Years of education: Not on file  . Highest education level: Not on file  Occupational History  . Occupation: Programmer, multimedia: [employer]  Tobacco Use  . Smoking status: Former Smoker    Packs/day: 0.30    Years: 10.00    Pack years: 3.00    Types: Cigarettes    Start date: 72    Quit date: 02/03/1975    Years since quitting: 45.1  . Smokeless tobacco: Never Used  Substance and Sexual Activity  . Alcohol use: No  . Drug use: No  . Sexual activity: Not on file  Other Topics Concern  . Not on file  Social History Narrative  . Not on file   Social Determinants of Health   Financial  Resource Strain: Not on file  Food Insecurity: Not on file  Transportation Needs: Not on file  Physical Activity: Not on file  Stress: Not on file  Social Connections: Not on file     Family History: The patient's family history includes Asthma in an other family member; Diabetes in her brother, maternal grandfather, and maternal grandmother; Heart disease in her brother, father, and mother.  ROS:   All other ROS reviewed and negative. Pertinent positives noted in the HPI.     EKGs/Labs/Other Studies Reviewed:   The following studies were personally reviewed by me today:   TTE 11/17/2018  1. Left ventricular ejection fraction, by visual estimation, is 60 to  65%. The left ventricle has normal function. Normal left ventricular size.  There is no left ventricular hypertrophy.  2. Low normal GLS -14.8.  3. Global right ventricle has normal systolic function.The right  ventricular size is normal. No increase in right ventricular wall  thickness.  4. Left atrial size was normal.  5. Right atrial size was normal.  6. The mitral valve is normal in structure. Mild mitral valve  regurgitation. No evidence of mitral stenosis.  7. The tricuspid valve is normal in structure. Tricuspid valve  regurgitation was not visualized by color flow Doppler.  8. The aortic valve is normal in structure. Aortic valve regurgitation  was not visualized by color flow Doppler. Structurally normal aortic  valve, with no evidence of sclerosis or stenosis.  9. The pulmonic valve was normal in structure. Pulmonic valve  regurgitation is not visualized by color flow Doppler.  10. Normal pulmonary artery systolic pressure.  11. The inferior vena cava is normal in size with greater than 50%  respiratory variability, suggesting right atrial pressure of 3 mmHg.   Recent Labs: 08/08/2019: BNP 98.7   Recent Lipid Panel No results found for: CHOL, TRIG, HDL, CHOLHDL, VLDL, LDLCALC, LDLDIRECT  Physical  Exam:   VS:  BP 120/60   Pulse (!) 52   Ht 5\' 4"  (1.626 m)   Wt 159 lb (72.1 kg)   SpO2 97%   BMI 27.29 kg/m    Wt Readings from Last 3 Encounters:  03/26/20 159 lb (72.1 kg)  01/30/20 154 lb (69.9 kg)  09/25/19 162 lb (73.5 kg)    General: Well nourished, well developed, in no acute distress Head: Atraumatic, normal size  Eyes: PEERLA, EOMI  Neck: Supple, no JVD Endocrine: No thryomegaly Cardiac: Normal S1, S2; RRR; no murmurs, rubs, or gallops Lungs: Clear to auscultation bilaterally, no wheezing, rhonchi or rales  Abd: Soft, nontender, no hepatomegaly  Ext: No edema, pulses 2+ Musculoskeletal: No deformities, BUE and  BLE strength normal and equal Skin: Warm and dry, no rashes   Neuro: Alert and oriented to person, place, time, and situation, CNII-XII grossly intact, no focal deficits  Psych: Normal mood and affect   ASSESSMENT:   JHANIYA BRISKI is a 75 y.o. female who presents for the following: 1. PAC (premature atrial contraction)   2. Sinus bradycardia   3. Palpitations   4. Essential hypertension     PLAN:   1. PAC (premature atrial contraction) 2. Sinus bradycardia 3. Palpitations -Had symptoms of PACs and palpitations after Covid.  Symptoms have improved.  Also with sinus bradycardia. -Echocardiogram with normal LV function. -Monitor without any significant conduction disease. -For now we are recommending to monitor her.  She may end up needing a pacemaker later in life but now she is without symptoms.  We will continue to see her every 6 months.  4. Essential hypertension -Blood pressure well controlled.  Continue current medications.   Disposition: Return in about 6 months (around 09/23/2020).  Medication Adjustments/Labs and Tests Ordered: Current medicines are reviewed at length with the patient today.  Concerns regarding medicines are outlined above.  No orders of the defined types were placed in this encounter.  No orders of the defined types  were placed in this encounter.   Patient Instructions  Medication Instructions:  The current medical regimen is effective;  continue present plan and medications.  *If you need a refill on your cardiac medications before your next appointment, please call your pharmacy*  Follow-Up: At [facility], you and your health needs are our priority.  As part of our continuing mission to provide you with exceptional heart care, we have created designated Provider Care Teams.  These Care Teams include your primary Cardiologist (physician) and Advanced Practice Providers (APPs -  Physician Assistants and Nurse Practitioners) who all work together to provide you with the care you need, when you need it.  We recommend signing up for the patient portal called "MyChart".  Sign up information is provided on this After Visit Summary.  MyChart is used to connect with patients for Virtual Visits (Telemedicine).  Patients are able to view lab/test results, encounter notes, upcoming appointments, etc.  Non-urgent messages can be sent to your provider as well.   To learn more about what you can do with MyChart, go to [email or web address].    Your next appointment:   6 month(s)  The format for your next appointment:   In Person  Provider:   Eleonore Chiquito, MD       Time Spent with Patient: I have spent a total of 25 minutes with patient reviewing hospital notes, telemetry, EKGs, labs and examining the patient as well as establishing an assessment and plan that was discussed with the patient.  > 50% of time was spent in direct patient care.  Signed, Addison Naegeli. Audie Box, [place]  [address] [place], [place] [address] [phone]  03/26/2020 4:54 PM

## 2020-03-25 ENCOUNTER — Other Ambulatory Visit: Payer: Self-pay | Admitting: Pulmonary Disease

## 2020-03-25 MED FILL — IRBESARTAN 300 MG TABLET: 300 | 90 days supply | Qty: 90 | Fill #0

## 2020-03-25 MED FILL — QVAR REDIHALER 80 MCG/ACT A: 80 | 30 days supply | Qty: 11 | Fill #0

## 2020-03-25 MED FILL — ATORVASTATIN CALCIUM 20 MG: 20 | 90 days supply | Qty: 90 | Fill #2

## 2020-03-25 MED FILL — METFORMIN HCL ER 500 MG TB2: 500 | 90 days supply | Qty: 90 | Fill #0

## 2020-03-26 ENCOUNTER — Ambulatory Visit: Payer: 59 | Admitting: Cardiovascular Disease

## 2020-03-26 ENCOUNTER — Encounter: Payer: Self-pay | Admitting: Cardiovascular Disease

## 2020-03-26 ENCOUNTER — Other Ambulatory Visit: Payer: Self-pay

## 2020-03-26 VITALS — BP 120/60 | HR 52 | Ht 64.0 in | Wt 159.0 lb

## 2020-03-26 DIAGNOSIS — R002 Palpitations: Secondary | ICD-10-CM

## 2020-03-26 DIAGNOSIS — R001 Bradycardia, unspecified: Secondary | ICD-10-CM

## 2020-03-26 DIAGNOSIS — I491 Atrial premature depolarization: Secondary | ICD-10-CM | POA: Diagnosis not present

## 2020-03-26 DIAGNOSIS — I10 Essential (primary) hypertension: Secondary | ICD-10-CM | POA: Diagnosis not present

## 2020-03-26 NOTE — Patient Instructions (Signed)
Medication Instructions:  The current medical regimen is effective;  continue present plan and medications.  *If you need a refill on your cardiac medications before your next appointment, please call your pharmacy*   Follow-Up: At [facility], you and your health needs are our priority.  As part of our continuing mission to provide you with exceptional heart care, we have created designated Provider Care Teams.  These Care Teams include your primary Cardiologist (physician) and Advanced Practice Providers (APPs -  Physician Assistants and Nurse Practitioners) who all work together to provide you with the care you need, when you need it.  We recommend signing up for the patient portal called "MyChart".  Sign up information is provided on this After Visit Summary.  MyChart is used to connect with patients for Virtual Visits (Telemedicine).  Patients are able to view lab/test results, encounter notes, upcoming appointments, etc.  Non-urgent messages can be sent to your provider as well.   To learn more about what you can do with MyChart, go to [email or web address].    Your next appointment:   6 month(s)  The format for your next appointment:   In Person  Provider:   [place] O'Neal, MD      

## 2020-04-03 MED FILL — XOLAIR 150 MG/ML SOSY: 150 | 28 days supply | Qty: 2 | Fill #8

## 2020-04-24 MED FILL — QVAR REDIHALER 80 MCG/ACT A: 80 | 30 days supply | Qty: 11 | Fill #1

## 2020-04-24 MED FILL — ALBUTEROL SULFATE HFA 108 (: 108 (90 BAS | 25 days supply | Qty: 18 | Fill #1

## 2020-04-29 MED FILL — VIT D2 1.25 MG (50,000 UNIT: 1.25 MG | 84 days supply | Qty: 24 | Fill #2

## 2020-04-30 ENCOUNTER — Other Ambulatory Visit (HOSPITAL_COMMUNITY): Payer: Self-pay

## 2020-04-30 MED FILL — XOLAIR 150 MG/ML SOSY: 150 | 28 days supply | Qty: 2 | Fill #9

## 2020-05-10 ENCOUNTER — Other Ambulatory Visit (HOSPITAL_BASED_OUTPATIENT_CLINIC_OR_DEPARTMENT_OTHER): Payer: Self-pay

## 2020-05-14 ENCOUNTER — Other Ambulatory Visit (HOSPITAL_COMMUNITY): Payer: Self-pay

## 2020-05-18 ENCOUNTER — Emergency Department (HOSPITAL_BASED_OUTPATIENT_CLINIC_OR_DEPARTMENT_OTHER): Payer: 59

## 2020-05-18 ENCOUNTER — Encounter (HOSPITAL_BASED_OUTPATIENT_CLINIC_OR_DEPARTMENT_OTHER): Payer: Self-pay | Admitting: *Deleted

## 2020-05-18 ENCOUNTER — Emergency Department (HOSPITAL_BASED_OUTPATIENT_CLINIC_OR_DEPARTMENT_OTHER)
Admission: EM | Admit: 2020-05-18 | Discharge: 2020-05-18 | Disposition: A | Discharge status: Home / Self Care | Payer: 59 | Attending: Emergency Medicine | Admitting: Emergency Medicine

## 2020-05-18 ENCOUNTER — Other Ambulatory Visit: Payer: Self-pay

## 2020-05-18 DIAGNOSIS — E119 Type 2 diabetes mellitus without complications: Secondary | ICD-10-CM | POA: Insufficient documentation

## 2020-05-18 DIAGNOSIS — I10 Essential (primary) hypertension: Secondary | ICD-10-CM | POA: Insufficient documentation

## 2020-05-18 DIAGNOSIS — Z87891 Personal history of nicotine dependence: Secondary | ICD-10-CM | POA: Diagnosis not present

## 2020-05-18 DIAGNOSIS — Z79899 Other long term (current) drug therapy: Secondary | ICD-10-CM | POA: Diagnosis not present

## 2020-05-18 DIAGNOSIS — R0602 Shortness of breath: Secondary | ICD-10-CM | POA: Diagnosis not present

## 2020-05-18 DIAGNOSIS — J45909 Unspecified asthma, uncomplicated: Secondary | ICD-10-CM | POA: Diagnosis not present

## 2020-05-18 DIAGNOSIS — J449 Chronic obstructive pulmonary disease, unspecified: Secondary | ICD-10-CM | POA: Diagnosis not present

## 2020-05-18 DIAGNOSIS — Z7984 Long term (current) use of oral hypoglycemic drugs: Secondary | ICD-10-CM | POA: Insufficient documentation

## 2020-05-18 DIAGNOSIS — Z8616 Personal history of COVID-19: Secondary | ICD-10-CM | POA: Insufficient documentation

## 2020-05-18 DIAGNOSIS — R001 Bradycardia, unspecified: Secondary | ICD-10-CM | POA: Insufficient documentation

## 2020-05-18 DIAGNOSIS — R079 Chest pain, unspecified: Secondary | ICD-10-CM

## 2020-05-18 DIAGNOSIS — R0789 Other chest pain: Secondary | ICD-10-CM | POA: Insufficient documentation

## 2020-05-18 LAB — CBC WITH DIFFERENTIAL/PLATELET
Abs Immature Granulocytes: 0.02 10*3/uL (ref 0.00–0.07)
Basophils Absolute: 0.1 10*3/uL (ref 0.0–0.1)
Basophils Relative: 1 %
Eosinophils Absolute: 0.1 10*3/uL (ref 0.0–0.5)
Eosinophils Relative: 1 %
HCT: 36.4 % (ref 36.0–46.0)
Hemoglobin: 12.4 g/dL (ref 12.0–15.0)
Immature Granulocytes: 0 %
Lymphocytes Relative: 21 %
Lymphs Abs: 1.8 10*3/uL (ref 0.7–4.0)
MCH: 30.5 pg (ref 26.0–34.0)
MCHC: 34.1 g/dL (ref 30.0–36.0)
MCV: 89.7 fL (ref 80.0–100.0)
Monocytes Absolute: 0.5 10*3/uL (ref 0.1–1.0)
Monocytes Relative: 6 %
Neutro Abs: 6.1 10*3/uL (ref 1.7–7.7)
Neutrophils Relative %: 71 %
Platelets: 369 10*3/uL (ref 150–400)
RBC: 4.06 MIL/uL (ref 3.87–5.11)
RDW: 12.5 % (ref 11.5–15.5)
WBC: 8.6 10*3/uL (ref 4.0–10.5)
nRBC: 0 % (ref 0.0–0.2)

## 2020-05-18 LAB — BASIC METABOLIC PANEL
Anion gap: 10 (ref 5–15)
BUN: 27 mg/dL — ABNORMAL HIGH (ref 8–23)
CO2: 24 mmol/L (ref 22–32)
Calcium: 9.3 mg/dL (ref 8.9–10.3)
Chloride: 104 mmol/L (ref 98–111)
Creatinine, Ser: 1.4 mg/dL — ABNORMAL HIGH (ref 0.44–1.00)
GFR, Estimated: 39 mL/min — ABNORMAL LOW (ref >60)
Glucose, Bld: 234 mg/dL — ABNORMAL HIGH (ref 70–99)
Potassium: 3.9 mmol/L (ref 3.5–5.1)
Sodium: 138 mmol/L (ref 135–145)

## 2020-05-18 LAB — D-DIMER, QUANTITATIVE: D-Dimer, Quant: 0.44 ug/mL-FEU (ref 0.00–0.50)

## 2020-05-18 LAB — TROPONIN I (HIGH SENSITIVITY)
Troponin I (High Sensitivity): 4 ng/L (ref <18)
Troponin I (High Sensitivity): 4 ng/L (ref <18)

## 2020-05-18 LAB — BRAIN NATRIURETIC PEPTIDE: B Natriuretic Peptide: 132.5 pg/mL — ABNORMAL HIGH (ref 0.0–100.0)

## 2020-05-18 NOTE — ED Provider Notes (Signed)
[facility] DEPT Provider Note   CSN: [id number] Arrival date & time: 05/18/20  1259     History Chief Complaint  Patient presents with  . Shortness of Breath  . Chest Pain    Elaine Luna is a 75 y.o. female.  Patient presents chief complaint of sensation of shortness of breath and chest pain.  Describes the chest discomfort as a vague pressure-like sensation in the mid chest that comes and goes over the course the last 2 days.  Associated with shortness of breath as well.  She denies any significant pain.  Does not appear exertional per patient.  Denies any fevers or cough or vomiting or diarrhea.  Patient has a history of small PE a few years ago and was on Eliquis for 3 months and then has since stopped.        Past Medical History:  Diagnosis Date  . Asthma   . Colon polyps   . DM type 2 (diabetes mellitus, type 2) ([place]) 2015   . Hyperlipidemia   . Hypertension   . Obesity   . Pulmonary embolism associated with COVID-19 ([place]) 12/25/2018  . Tubular adenoma     Patient Active Problem List   Diagnosis Date Noted  . Bradycardia 11/10/2018  . Allergic rhinitis 01/18/2017  . Pulmonary nodule 03/08/2015  . DM2 (diabetes mellitus, type 2) ([place]) 07/26/2013  . DYSPHAGIA 12/31/2008  . PERSONAL HX COLONIC POLYPS 12/31/2008  . Severe persistent asthma 04/21/2007  . HYPERLIPIDEMIA 10/24/2006  . HYPERTENSION 10/24/2006    Past Surgical History:  Procedure Laterality Date  . APPENDECTOMY    . CARDIAC CATHETERIZATION    . CATARACT EXTRACTION, BILATERAL  2011  . CHOLECYSTECTOMY    . ESOPHAGOGASTRODUODENOSCOPY  2011  . TONSILLECTOMY    . VESICOVAGINAL FISTULA CLOSURE W/ TAH       OB History   No obstetric history on file.     Family History  Problem Relation Age of Onset  . Asthma Other        all children  . Diabetes Maternal Grandfather   . Diabetes Maternal Grandmother   . Diabetes Brother   . Heart disease Mother   . Heart  disease Brother   . Heart disease Father     Social History   Tobacco Use  . Smoking status: Former Smoker    Packs/day: 0.30    Years: 10.00    Pack years: 3.00    Types: Cigarettes    Start date: 69    Quit date: 02/03/1975    Years since quitting: 45.3  . Smokeless tobacco: Never Used  Substance Use Topics  . Alcohol use: No  . Drug use: No    Home Medications Prior to Admission medications   Medication Sig Start Date End Date Taking? Authorizing Provider  albuterol (PROVENTIL) (2.5 MG/3ML) 0.083% nebulizer solution USE 1 VIAL IN NEBULIZER EVERY 6 HOURS AS NEEDED FOR WHEEZING. 03/14/18   Juanito Doom, MD  albuterol (VENTOLIN HFA) 108 (90 Base) MCG/ACT inhaler INHALE 1-2 PUFFS BY MOUTH 4 TIMES DAILY AS NEEDED 01/16/20 01/15/21  Parrett, Tammy S, NP  amLODipine (NORVASC) 10 MG tablet TAKE 1 TABLET BY MOUTH ONCE DAILY. 08/08/19 08/07/20  Geralynn Rile, MD  atorvastatin (LIPITOR) 20 MG tablet Take 20 mg by mouth daily.    [provider]  atorvastatin (LIPITOR) 20 MG tablet TAKE 1 TABLET BY MOUTH ONCE DAILY 09/18/19 09/17/20  Shon Baton, MD  beclomethasone (QVAR) 80 MCG/ACT inhaler INHALE  2 PUFFS BY MOUTH INTO THE LUNGS 2 (TWO) TIMES DAILY. 03/25/20 03/25/21  Margaretha Seeds, MD  Denosumab (PROLIA [place]) Inject into the skin every 6 (six) months.    [provider]  hydrochlorothiazide (HYDRODIURIL) 25 MG tablet TAKE 1 TABLET BY MOUTH ONCE A DAY AS NEEDED 03/08/20 03/08/21  Shon Baton, MD  hydrochlorothiazide (MICROZIDE) 12.5 MG capsule Take 12.5 mg by mouth daily.    [provider]  irbesartan (AVAPRO) 300 MG tablet Take 300 mg by mouth daily.  11/15/18   [provider]  irbesartan (AVAPRO) 300 MG tablet TAKE 1 TABLET BY MOUTH ONCE DAILY 03/25/20 03/25/21  Shon Baton, MD  irbesartan (AVAPRO) 300 MG tablet TAKE 1 TABLET BY MOUTH ONCE DAILY 05/30/19 05/29/20  Shon Baton, MD  metFORMIN (GLUCOPHAGE-XR) 500 MG 24 hr tablet Take 1 tablet by mouth  daily with breakfast. Currently taking only 3 times weekly    [provider]  metFORMIN (GLUCOPHAGE-XR) 500 MG 24 hr tablet TAKE 1 TABLET BY MOUTH ONCE DAILY WITH SUPPER Patient taking differently: Take by mouth every 3 (three) days. 03/25/20 03/25/21  Shon Baton, MD  mometasone-formoterol (DULERA) 200-5 MCG/ACT AERO INHALE 2 PUFFS BY MOUTH 2 TIMES DAILY. 05/10/19   Margaretha Seeds, MD  montelukast (SINGULAIR) 10 MG tablet TAKE 1 TABLET (10 MG TOTAL) BY MOUTH DAILY. 03/08/20 03/08/21  Margaretha Seeds, MD  omalizumab Arvid Right) 150 MG/ML prefilled syringe INJECT 300 MG INTO THE SKIN EVERY 28 DAYS. 07/20/19 07/19/20  Tresa Garter, MD  Vitamin D, Ergocalciferol, (DRISDOL) 1.25 MG (50000 UNIT) CAPS capsule TAKE 1 CAPSULE BY MOUTH TWICE WEEKLY 10/30/19 10/29/20  Shon Baton, MD  Vitamin D, Ergocalciferol, (DRISDOL) 50000 UNITS CAPS Take 50,000 Units by mouth. Twice weekly    [provider]    Allergies    Patient has no known allergies.  Review of Systems   Review of Systems  Constitutional: Negative for fever.  HENT: Negative for ear pain.   Eyes: Negative for pain.  Respiratory: Positive for chest tightness and shortness of breath. Negative for cough.   Gastrointestinal: Negative for abdominal pain.  Genitourinary: Negative for flank pain.  Musculoskeletal: Negative for back pain.  Skin: Negative for rash.  Neurological: Negative for headaches.    Physical Exam Updated Vital Signs BP (!) 123/54   Pulse (!) 46   Temp 97.9 F (36.6 C) (Oral)   Resp 18   Ht 5\' 4"  (1.626 m)   Wt 68 kg   SpO2 98%   BMI 25.75 kg/m   Physical Exam Constitutional:      General: She is not in acute distress.    Appearance: Normal appearance.  HENT:     Head: Normocephalic.     Nose: Nose normal.  Eyes:     Extraocular Movements: Extraocular movements intact.  Cardiovascular:     Rate and Rhythm: Bradycardia present.  Pulmonary:     Effort: Pulmonary effort is normal.   Musculoskeletal:        General: Normal range of motion.     Cervical back: Normal range of motion.  Neurological:     General: No focal deficit present.     Mental Status: She is alert. Mental status is at baseline.     ED Results / Procedures / Treatments   Labs (all labs ordered are listed, but only abnormal results are displayed) Labs Reviewed  BRAIN NATRIURETIC PEPTIDE - Abnormal; Notable for the following components:      Result Value  B Natriuretic Peptide 132.5 (*)    All other components within normal limits  BASIC METABOLIC PANEL - Abnormal; Notable for the following components:   Glucose, Bld 234 (*)    BUN 27 (*)    Creatinine, Ser 1.40 (*)    GFR, Estimated 39 (*)    All other components within normal limits  CBC WITH DIFFERENTIAL/PLATELET  D-DIMER, QUANTITATIVE  TROPONIN I (HIGH SENSITIVITY)  TROPONIN I (HIGH SENSITIVITY)    EKG EKG Interpretation  Date/Time:  Saturday May 18 2020 14:13:30 EDT Ventricular Rate:  86 PR Interval:    QRS Duration: 78 QT Interval:  455 QTC Calculation: 381 R Axis:   47 Text Interpretation: Accelerated junctional rhythm Ventricular bigeminy Abnormal R-wave progression, early transition Borderline repolarization abnormality Confirmed by Thamas Jaegers ([id number]) on 05/18/2020 2:51:58 PM   Radiology DG Chest Port 1 View  Result Date: 05/18/2020 CLINICAL DATA:  Shortness of breath since yesterday. EXAM: PORTABLE CHEST 1 VIEW COMPARISON:  November 14, 2018 FINDINGS: The heart size and mediastinal contours are within normal limits. Both lungs are clear. Lungs are hyperinflated. The visualized skeletal structures are unremarkable. IMPRESSION: No active cardiopulmonary disease. COPD. Electronically Signed   By: Abelardo Diesel M.D.   On: 05/18/2020 14:25    Procedures Procedures   Medications Ordered in ED Medications - No data to display  ED Course  I have reviewed the triage vital signs and the nursing notes.  Pertinent labs &  imaging results that were available during my care of the patient were reviewed by me and considered in my medical decision making (see chart for details).    MDM Rules/Calculators/A&P                          Patient has a history of bradycardia in the last several months and continues to bradycardic heart rate in the 50 to 60 bpm range.  Troponin is negative D-dimer is negative as well.  Chest x-ray shows no acute cardiopulmonary disease process.  Second troponin is pending.  Patient was signed out oncoming physician provider.  Final Clinical Impression(s) / ED Diagnoses Final diagnoses:  Chest pain, unspecified type    Rx / DC Orders ED Discharge Orders    None       Luna Fuse, MD 05/18/20 1454

## 2020-05-18 NOTE — ED Provider Notes (Signed)
Patient second troponin was normal.  Patient's D-dimer negative.  No evidence of any acute cardiac findings or abnormalities here today.  Recommend she follow-up with her cardiologist which patient will make arrangements to do.   Fredia Sorrow, MD 05/18/20 [phone]

## 2020-05-18 NOTE — Discharge Instructions (Signed)
Work-up here for the chest pain without any acute findings.  Make an appointment to follow-up with your cardiologist.  Return for any new or worse symptoms.

## 2020-05-18 NOTE — ED Notes (Addendum)
C/o SOB since yesterday - increases with activity  Denies N/V/D  Labs drawn and ED process explained  Has call bell, TV remote, blanket and lights turned down

## 2020-05-18 NOTE — ED Triage Notes (Signed)
Pt developed shob yesterday with fullness in chest. She denies actual chest pain. After Covid she had an emboli.

## 2020-05-20 ENCOUNTER — Telehealth: Payer: Self-pay | Admitting: Cardiovascular Disease

## 2020-05-20 NOTE — Telephone Encounter (Signed)
Called patient, she states that she has always had the bradycardia- per last OV patient was doing well with no symptoms, but since the visit she has been having SOB and becoming lightheaded.  Patient states HR is consistently in the high 40's, and 50's. She did go to the ED this weekend and they reported HR was 33. She was sent home after HR increased and told to follow with cardiologist- patient did make an appointment for when Dr.O'Neal returns to office next week, but I did advise her to call EMS and go to ED if lower HR continues or symptoms worsen. Patient verbalized understanding and will do this.   Do you want to go ahead and send her to EP as a stat referral, discussion was for pacemaker at previous visit.

## 2020-05-20 NOTE — Telephone Encounter (Signed)
Pt c/o Shortness Of Breath: STAT if SOB developed within the last 24 hours or pt is noticeably SOB on the phone  1. Are you currently SOB (can you hear that pt is SOB on the phone)? No because Pt is laying down  2. How long have you been experiencing SOB? SOB since 05/17/20  3. Are you SOB when sitting or when up moving around? All the time   4. Are you currently experiencing any other symptoms? Palpitations  Patient c/o Palpitations:  High priority if patient c/o lightheadedness, shortness of breath, or chest pain  1) How long have you had palpitations/irregular HR/ Afib? Are you having the symptoms now? Since 05/17/2020  2) Are you currently experiencing lightheadedness, SOB or CP?  A little lightheadedness/No CP  3) Do you have a history of afib (atrial fibrillation) or irregular heart rhythm? No history of either.  4) Have you checked your BP or HR? (document readings if available): 42 while laying in bed  5) Are you experiencing any other symptoms? Chest Fullness   Offered Pt an appt but she declined because she wants to be able to visit her grand kids in [place]

## 2020-05-21 NOTE — Telephone Encounter (Signed)
She is always bradycardic. Nothing new.   Elaine Bells T. Audie Box, MD, [place]  [address] [place], [place] [address] [phone]  9:40 AM

## 2020-05-21 NOTE — Telephone Encounter (Signed)
Called patient, advised to keep appointment for next week.  Patient verbalized understanding.

## 2020-05-24 ENCOUNTER — Other Ambulatory Visit (HOSPITAL_COMMUNITY): Payer: Self-pay

## 2020-05-24 MED FILL — Omalizumab Subcutaneous Soln Prefilled Syringe 150 MG/ML: SUBCUTANEOUS | 28 days supply | Qty: 2 | Fill #0 | Status: AC

## 2020-05-26 NOTE — Progress Notes (Signed)
Cardiology Office Note:   Date:  05/28/2020  NAME:  Elaine Luna    MRN: [id number] DOB:  04/17/1945   PCP:  Shon Baton, MD  Cardiologist:  Evalina Field, MD   Referring MD: Shon Baton, MD   Chief Complaint  Patient presents with  . Follow-up   History of Present Illness:   Elaine Luna is a 75 y.o. female with a hx of sinus bradycardia, HTN, DM who presents for follow-up. Seen in the ER 4/15 for chest pain and SOB. Troponin negative x 2. BNP 134. Needs ETT for heart rate.  She reports on Saturday she felt tightness in her chest.  She reports her symptoms of shortness of breath have never really improved.  She was seen in the emergency room and found to have no significant pathology found.  She ruled out for myocardial infarction.  BNP was mildly elevated but close to normal for her.  Her EKG today demonstrates sinus rhythm heart rate 58 with occasional PVCs.  I did review the EKGs which show sinus rhythm and occasional isorhythmic dissociation.  None of this is new.  She reports occasional dizziness as well.  We discussed pursuing an exercise treadmill stress test just to make sure she does not have chronotropic incompetence.  She has a narrow QRS and I really do not feel that she will have an indication for pacemaker.  She is reassured by this.  I did discuss possibly stopping her amlodipine as there is an off chance this could be contributing to her bradycardia.  Overall, it appears nothing really has changed.  Her lungs appear to be stable.  We also discussed pursuing a coronary CTA just to make doubly sure she does not have CAD.  She had a CT PE study 2 years ago that had no evidence of coronary calcium.  Overall I feel she has very low risk for CAD.  She would like to start with an exercise treadmill stress test.  No recent fevers or chills.  No recent medication changes.  Unclear what is changed.   Problem List 1. Covid 19 PNA 2. PE 11/14/2018 3. Palpitations -PAC burden  3.4% 4. HTN 5. DM -A1c 5.5 -0 coronary calcium on CT PE study  Past Medical History: Past Medical History:  Diagnosis Date  . Asthma   . Colon polyps   . DM type 2 (diabetes mellitus, type 2) ([place]) 2015   . Hyperlipidemia   . Hypertension   . Obesity   . Pulmonary embolism associated with COVID-19 ([place]) 12/25/2018  . Tubular adenoma     Past Surgical History: Past Surgical History:  Procedure Laterality Date  . APPENDECTOMY    . CARDIAC CATHETERIZATION    . CATARACT EXTRACTION, BILATERAL  2011  . CHOLECYSTECTOMY    . ESOPHAGOGASTRODUODENOSCOPY  2011  . TONSILLECTOMY    . VESICOVAGINAL FISTULA CLOSURE W/ TAH      Current Medications: Current Meds  Medication Sig  . albuterol (PROVENTIL) (2.5 MG/3ML) 0.083% nebulizer solution USE 1 VIAL IN NEBULIZER EVERY 6 HOURS AS NEEDED FOR WHEEZING.  Marland Kitchen albuterol (VENTOLIN HFA) 108 (90 Base) MCG/ACT inhaler INHALE 1-2 PUFFS BY MOUTH 4 TIMES DAILY AS NEEDED  . atorvastatin (LIPITOR) 20 MG tablet Take 20 mg by mouth daily.  . beclomethasone (QVAR) 80 MCG/ACT inhaler INHALE 2 PUFFS BY MOUTH INTO THE LUNGS 2 (TWO) TIMES DAILY.  Marland Kitchen Denosumab (PROLIA [place]) Inject into the skin every 6 (six) months.  . hydrochlorothiazide (HYDRODIURIL) 25 MG  tablet TAKE 1 TABLET BY MOUTH ONCE A DAY AS NEEDED  . irbesartan (AVAPRO) 300 MG tablet Take 300 mg by mouth daily.   . metFORMIN (GLUCOPHAGE-XR) 500 MG 24 hr tablet Take 1 tablet by mouth daily with breakfast. Currently taking only 3 times weekly  . mometasone-formoterol (DULERA) 200-5 MCG/ACT AERO INHALE 2 PUFFS BY MOUTH 2 TIMES DAILY.  . montelukast (SINGULAIR) 10 MG tablet TAKE 1 TABLET (10 MG TOTAL) BY MOUTH DAILY.  Marland Kitchen omalizumab (XOLAIR) 150 MG/ML prefilled syringe INJECT 300 MG INTO THE SKIN EVERY 28 DAYS.  Marland Kitchen Vitamin D, Ergocalciferol, (DRISDOL) 50000 UNITS CAPS Take 50,000 Units by mouth. Twice weekly  . [DISCONTINUED] amLODipine (NORVASC) 10 MG tablet TAKE 1 TABLET BY MOUTH ONCE DAILY.  .  [DISCONTINUED] atorvastatin (LIPITOR) 20 MG tablet TAKE 1 TABLET BY MOUTH ONCE DAILY  . [DISCONTINUED] hydrochlorothiazide (MICROZIDE) 12.5 MG capsule Take 12.5 mg by mouth daily.  . [DISCONTINUED] irbesartan (AVAPRO) 300 MG tablet TAKE 1 TABLET BY MOUTH ONCE DAILY  . [DISCONTINUED] irbesartan (AVAPRO) 300 MG tablet TAKE 1 TABLET BY MOUTH ONCE DAILY  . [DISCONTINUED] metFORMIN (GLUCOPHAGE-XR) 500 MG 24 hr tablet TAKE 1 TABLET BY MOUTH ONCE DAILY WITH SUPPER (Patient taking differently: Take 500 mg by mouth 3 (three) times a week.)  . [DISCONTINUED] Vitamin D, Ergocalciferol, (DRISDOL) 1.25 MG (50000 UNIT) CAPS capsule TAKE 1 CAPSULE BY MOUTH TWICE WEEKLY   Current Facility-Administered Medications for the 05/28/20 encounter (Office Visit) with Geralynn Rile, MD  Medication  . omalizumab Arvid Right) injection 300 mg     Allergies:    Patient has no known allergies.   Social History: Social History   Socioeconomic History  . Marital status: Widowed    Spouse name: Not on file  . Number of children: 4  . Years of education: Not on file  . Highest education level: Not on file  Occupational History  . Occupation: Programmer, multimedia: [employer]  Tobacco Use  . Smoking status: Former Smoker    Packs/day: 0.30    Years: 10.00    Pack years: 3.00    Types: Cigarettes    Start date: 65    Quit date: 02/03/1975    Years since quitting: 45.3  . Smokeless tobacco: Never Used  Substance and Sexual Activity  . Alcohol use: No  . Drug use: No  . Sexual activity: Not on file  Other Topics Concern  . Not on file  Social History Narrative  . Not on file   Social Determinants of Health   Financial Resource Strain: Not on file  Food Insecurity: Not on file  Transportation Needs: Not on file  Physical Activity: Not on file  Stress: Not on file  Social Connections: Not on file     Family History: The patient's family history includes Asthma in an other family member; Diabetes in  her brother, maternal grandfather, and maternal grandmother; Heart disease in her brother, father, and mother.  ROS:   All other ROS reviewed and negative. Pertinent positives noted in the HPI.     EKGs/Labs/Other Studies Reviewed:   The following studies were personally reviewed by me today:  EKG:  EKG is ordered today.  The ekg ordered today demonstrates sinus bradycardia heart rate 58, PVCs noted, and was personally reviewed by me.   [facility] 12/11/2018 1. Predominant rhythm was sinus bradycardia @ 49 bpm.  2. No significant arrhythmias to explain symptoms.  3. Isorhythmic dissociation present (benign finding).  4. Rare PACs  and PVCs.    TTE 11/17/2018 1. Left ventricular ejection fraction, by visual estimation, is 60 to  65%. The left ventricle has normal function. Normal left ventricular size.  There is no left ventricular hypertrophy.  2. Low normal GLS -14.8.  3. Global right ventricle has normal systolic function.The right  ventricular size is normal. No increase in right ventricular wall  thickness.  4. Left atrial size was normal.  5. Right atrial size was normal.  6. The mitral valve is normal in structure. Mild mitral valve  regurgitation. No evidence of mitral stenosis.  7. The tricuspid valve is normal in structure. Tricuspid valve  regurgitation was not visualized by color flow Doppler.  8. The aortic valve is normal in structure. Aortic valve regurgitation  was not visualized by color flow Doppler. Structurally normal aortic  valve, with no evidence of sclerosis or stenosis.  9. The pulmonic valve was normal in structure. Pulmonic valve  regurgitation is not visualized by color flow Doppler.  10. Normal pulmonary artery systolic pressure.  11. The inferior vena cava is normal in size with greater than 50%  respiratory variability, suggesting right atrial pressure of 3 mmHg.   Recent Labs: 05/18/2020: B Natriuretic Peptide 132.5; Luna 27; Creatinine, Ser 1.40;  Hemoglobin 12.4; Platelets 369; Potassium 3.9; Sodium 138   Recent Lipid Panel No results found for: CHOL, TRIG, HDL, CHOLHDL, VLDL, LDLCALC, LDLDIRECT  Physical Exam:   VS:  BP 128/62 (BP Location: Left Arm, Patient Position: Sitting, Cuff Size: Normal)   Pulse (!) 58   Ht 5\' 4"  (1.626 m)   Wt 159 lb (72.1 kg)   BMI 27.29 kg/m    Wt Readings from Last 3 Encounters:  05/28/20 159 lb (72.1 kg)  05/18/20 150 lb (68 kg)  03/26/20 159 lb (72.1 kg)    General: Well nourished, well developed, in no acute distress Head: Atraumatic, normal size  Eyes: PEERLA, EOMI  Neck: Supple, no JVD Endocrine: No thryomegaly Cardiac: Normal S1, S2; RRR; no murmurs, rubs, or gallops Lungs: Clear to auscultation bilaterally, no wheezing, rhonchi or rales  Abd: Soft, nontender, no hepatomegaly  Ext: No edema, pulses 2+ Musculoskeletal: No deformities, BUE and BLE strength normal and equal Skin: Warm and dry, no rashes   Neuro: Alert and oriented to person, place, time, and situation, CNII-XII grossly intact, no focal deficits  Psych: Normal mood and affect   ASSESSMENT:   Elaine Luna is a 75 y.o. female who presents for the following: 1. Sinus bradycardia   2. PAC (premature atrial contraction)   3. Palpitations   4. SOB (shortness of breath)     PLAN:   1. Sinus bradycardia 2. PAC (premature atrial contraction) 3. Palpitations 4. SOB (shortness of breath) -Recently seen in the emergency room for chest pain or shortness of breath.  Troponins were negative.  EKG shows sinus rhythm.  She did have sinus bradycardia with occasional junctional escape rhythm.  She has well-known isorhythmic dissociation.  Her symptoms really have not resolved.  She is short of breath all the time.  She does describe some dizziness.  Her EKG in office demonstrates sinus bradycardia with PVCs.  She had symptoms start when she had coronavirus pneumonia in 2020.  Symptoms had improved.  Monitor showed brief PACs.  It  is unclear what is changed recently.  I do not believe that her heart rate is contributing to her symptoms.  Given that her QRS is narrow I really do not think she  has advanced conduction disease that would mandate pacemaker implantation.  I think we simply could just pursue an exercise treadmill stress test to make sure she has no evidence of chronotropic incompetence.  She is on amlodipine.  This is a peripherally acting calcium channel blocker.  There is an off chance this could be contributing and we will stop this to see if her heart rate improves.  We will get a better idea of her heart rate response with treadmill stress test.  She has no evidence of congestive heart failure to explain her shortness of breath.  She had a CT PE study in 2020 that showed no evidence of coronary calcium.  Overall I feel she does not have symptoms suggestive of obstructive CAD.  She has no evidence of congestive heart failure either.  I really see no need to repeat her echocardiogram.  This was largely normal 1 year ago.  We will start with the exercise treadmill stress test and possibly pursue a repeat heart monitor.  We also could pursue a coronary CTA if we needed to.  For now she would like to start with an exercise treadmill stress test.  Shared Decision Making/Informed Consent The risks [chest pain, shortness of breath, cardiac arrhythmias, dizziness, blood pressure fluctuations, myocardial infarction, stroke/transient ischemic attack, and life-threatening complications (estimated to be 1 in 10,000)], benefits (risk stratification, diagnosing coronary artery disease, treatment guidance) and alternatives of an exercise tolerance test were discussed in detail with Elaine Luna and she agrees to proceed.  Disposition: Return in about 6 months (around 11/27/2020).  Medication Adjustments/Labs and Tests Ordered: Current medicines are reviewed at length with the patient today.  Concerns regarding medicines are outlined  above.  Orders Placed This Encounter  Procedures  . Cardiac Stress Test: Informed Consent Details: Physician/Practitioner Attestation; Transcribe to consent form and obtain patient signature  . EXERCISE TOLERANCE TEST (ETT)  . EKG 12-Lead   No orders of the defined types were placed in this encounter.   Patient Instructions  Medication Instructions:  Stop Amlodipine   *If you need a refill on your cardiac medications before your next appointment, please call your pharmacy*   Testing/Procedures: Your physician has requested that you have an exercise tolerance test, this is a screening tool to track your fitness level. This test evaluates the your exercise capacity by measuring cardiovascular response to exercise, the stress response is induced by exercise (exercise-treadmill).  Graded exercise test is also known as maximal exercise test or stress EKG test  . Please also follow instruction sheet given.   Follow-Up: At [facility], you and your health needs are our priority.  As part of our continuing mission to provide you with exceptional heart care, we have created designated Provider Care Teams.  These Care Teams include your primary Cardiologist (physician) and Advanced Practice Providers (APPs -  Physician Assistants and Nurse Practitioners) who all work together to provide you with the care you need, when you need it.  We recommend signing up for the patient portal called "MyChart".  Sign up information is provided on this After Visit Summary.  MyChart is used to connect with patients for Virtual Visits (Telemedicine).  Patients are able to view lab/test results, encounter notes, upcoming appointments, etc.  Non-urgent messages can be sent to your provider as well.   To learn more about what you can do with MyChart, go to [email or web address].    Your next appointment:   6 month(s)  The format for your next appointment:  In Person  Provider:   Eleonore Chiquito, MD        Time Spent with Patient: I have spent a total of 35 minutes with patient reviewing hospital notes, telemetry, EKGs, labs and examining the patient as well as establishing an assessment and plan that was discussed with the patient.  > 50% of time was spent in direct patient care.  Signed, Addison Naegeli. Audie Box, MD, [place]  [address], [place], [place] [address] [phone]  05/28/2020 10:54 AM

## 2020-05-28 ENCOUNTER — Ambulatory Visit (INDEPENDENT_AMBULATORY_CARE_PROVIDER_SITE_OTHER): Payer: 59 | Admitting: Cardiovascular Disease

## 2020-05-28 ENCOUNTER — Encounter: Payer: Self-pay | Admitting: Cardiovascular Disease

## 2020-05-28 ENCOUNTER — Other Ambulatory Visit (HOSPITAL_COMMUNITY): Payer: Self-pay

## 2020-05-28 ENCOUNTER — Other Ambulatory Visit: Payer: Self-pay

## 2020-05-28 VITALS — BP 128/62 | HR 58 | Ht 64.0 in | Wt 159.0 lb

## 2020-05-28 DIAGNOSIS — R002 Palpitations: Secondary | ICD-10-CM

## 2020-05-28 DIAGNOSIS — I491 Atrial premature depolarization: Secondary | ICD-10-CM | POA: Diagnosis not present

## 2020-05-28 DIAGNOSIS — R001 Bradycardia, unspecified: Secondary | ICD-10-CM | POA: Diagnosis not present

## 2020-05-28 DIAGNOSIS — R0602 Shortness of breath: Secondary | ICD-10-CM | POA: Diagnosis not present

## 2020-05-28 NOTE — Patient Instructions (Signed)
Medication Instructions:  Stop Amlodipine   *If you need a refill on your cardiac medications before your next appointment, please call your pharmacy*   Testing/Procedures: Your physician has requested that you have an exercise tolerance test, this is a screening tool to track your fitness level. This test evaluates the your exercise capacity by measuring cardiovascular response to exercise, the stress response is induced by exercise (exercise-treadmill).  Graded exercise test is also known as maximal exercise test or stress EKG test  . Please also follow instruction sheet given.   Follow-Up: At [facility], you and your health needs are our priority.  As part of our continuing mission to provide you with exceptional heart care, we have created designated Provider Care Teams.  These Care Teams include your primary Cardiologist (physician) and Advanced Practice Providers (APPs -  Physician Assistants and Nurse Practitioners) who all work together to provide you with the care you need, when you need it.  We recommend signing up for the patient portal called "MyChart".  Sign up information is provided on this After Visit Summary.  MyChart is used to connect with patients for Virtual Visits (Telemedicine).  Patients are able to view lab/test results, encounter notes, upcoming appointments, etc.  Non-urgent messages can be sent to your provider as well.   To learn more about what you can do with MyChart, go to [email or web address].    Your next appointment:   6 month(s)  The format for your next appointment:   In Person  Provider:   Eleonore Chiquito, MD

## 2020-05-30 ENCOUNTER — Other Ambulatory Visit: Payer: Self-pay

## 2020-05-30 ENCOUNTER — Other Ambulatory Visit (HOSPITAL_COMMUNITY): Payer: Self-pay

## 2020-05-30 ENCOUNTER — Ambulatory Visit: Payer: 59 | Attending: Internal Medicine

## 2020-05-30 ENCOUNTER — Other Ambulatory Visit (HOSPITAL_BASED_OUTPATIENT_CLINIC_OR_DEPARTMENT_OTHER): Payer: Self-pay

## 2020-05-30 ENCOUNTER — Telehealth (HOSPITAL_COMMUNITY): Payer: Self-pay | Admitting: *Deleted

## 2020-05-30 DIAGNOSIS — Z23 Encounter for immunization: Secondary | ICD-10-CM

## 2020-05-30 MED ORDER — PFIZER-BIONT COVID-19 VAC-TRIS 30 MCG/0.3ML IM SUSP
INTRAMUSCULAR | 0 refills | Status: DC
  Filled 2020-05-30: qty 0.3, 1d supply, fill #0

## 2020-05-30 NOTE — Progress Notes (Signed)
   Covid-19 Vaccination Clinic  Name:  Elaine Luna    MRN: [id number] DOB: Apr 30, 1945  05/30/2020  [name] was observed post Covid-19 immunization for 15 minutes without incident. She was provided with Vaccine Information Sheet and instruction to access the V-Safe system.   [name] was instructed to call 911 with any severe reactions post vaccine: Marland Kitchen Difficulty breathing  . Swelling of face and throat  . A fast heartbeat  . A bad rash all over body  . Dizziness and weakness   Immunizations Administered    Name Date Dose VIS Date Route   PFIZER Comrnaty(Gray TOP) Covid-19 Vaccine 05/30/2020 10:44 AM 0.3 mL 01/11/2020 Intramuscular   Manufacturer: [employer]   Lot: [id number]   NDC: [phone]

## 2020-05-30 NOTE — Telephone Encounter (Signed)
Close encounter 

## 2020-05-31 ENCOUNTER — Ambulatory Visit (HOSPITAL_COMMUNITY)
Admission: RE | Admit: 2020-05-31 | Discharge: 2020-05-31 | Disposition: A | Discharge status: Home / Self Care | Payer: 59 | Source: Ambulatory Visit | Attending: Cardiology | Admitting: Cardiology

## 2020-05-31 DIAGNOSIS — R001 Bradycardia, unspecified: Secondary | ICD-10-CM | POA: Diagnosis not present

## 2020-05-31 LAB — EXERCISE TOLERANCE TEST
Estimated workload: 5.2 METS
Exercise duration (min): 3 min
Exercise duration (sec): 32 s
MPHR: 143 {beats}/min
Peak HR: 91 {beats}/min
Percent HR: 63 %
Rest HR: 49 {beats}/min

## 2020-06-03 ENCOUNTER — Other Ambulatory Visit: Payer: Self-pay

## 2020-06-03 DIAGNOSIS — I4589 Other specified conduction disorders: Secondary | ICD-10-CM

## 2020-06-13 ENCOUNTER — Other Ambulatory Visit (HOSPITAL_COMMUNITY): Payer: Self-pay

## 2020-06-13 ENCOUNTER — Other Ambulatory Visit: Payer: Self-pay | Admitting: Pulmonary Disease

## 2020-06-13 MED FILL — Montelukast Sodium Tab 10 MG (Base Equiv): ORAL | 90 days supply | Qty: 90 | Fill #0 | Status: CN

## 2020-06-14 ENCOUNTER — Other Ambulatory Visit (HOSPITAL_COMMUNITY): Payer: Self-pay

## 2020-06-14 ENCOUNTER — Other Ambulatory Visit (HOSPITAL_BASED_OUTPATIENT_CLINIC_OR_DEPARTMENT_OTHER): Payer: Self-pay

## 2020-06-14 MED FILL — Montelukast Sodium Tab 10 MG (Base Equiv): ORAL | 90 days supply | Qty: 90 | Fill #0 | Status: AC

## 2020-06-17 NOTE — Progress Notes (Signed)
Electrophysiology Office Note:    Date:  06/18/2020   ID:  Elaine Luna, DOB [birth year], MRN [id number]  PCP:  Shon Baton, MD  [facility] Cardiologist:  Evalina Field, MD  [facility] Electrophysiologist:  None   Referring MD: Shon Baton, MD   Chief Complaint: Symptomatic bradycardia  History of Present Illness:    Elaine Luna is a 75 y.o. female who presents for an evaluation of symptomatic bradycardia at the request of Dr. Audie Box. Their medical history includes asthma, diabetes, hypertension, hyperlipidemia, obesity.  The patient was last seen by Dr. Audie Box on May 28, 2020.  At that appointment came shortly after presentation to the emergency department May 17, 2020 for chest pain and shortness of breath.  Her work-up there was negative.  There is a question raised about whether or not she had chronotropic incompetence.  At that appointment an exercise tolerance test was ordered.  Today in clinic she tells me she is very active.  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  She takes [employer] shifts at the [employer] [facility] department.  She is very active and does not report any consistent exercise intolerance that she has been able to link to bradycardia.  She tells me at some point she is resting and will notice a change in her heart rhythm.  This only occurs on the heart rates dipped into the 30s.  She points to her neck when she describes the symptoms that come on when the heart rates go that low.  Past Medical History:  Diagnosis Date  . Asthma   . Colon polyps   . DM type 2 (diabetes mellitus, type 2) ([place]) 2015   . Hyperlipidemia   . Hypertension   . Obesity   . Pulmonary embolism associated with COVID-19 ([place]) 12/25/2018  . Tubular adenoma     Past Surgical History:  Procedure Laterality Date  . APPENDECTOMY    . CARDIAC CATHETERIZATION    . CATARACT EXTRACTION, BILATERAL  2011  . CHOLECYSTECTOMY    . ESOPHAGOGASTRODUODENOSCOPY   2011  . TONSILLECTOMY    . VESICOVAGINAL FISTULA CLOSURE W/ TAH      Current Medications: Current Meds  Medication Sig  . albuterol (PROVENTIL) (2.5 MG/3ML) 0.083% nebulizer solution USE 1 VIAL IN NEBULIZER EVERY 6 HOURS AS NEEDED FOR WHEEZING.  Marland Kitchen albuterol (VENTOLIN HFA) 108 (90 Base) MCG/ACT inhaler INHALE 1-2 PUFFS BY MOUTH 4 TIMES DAILY AS NEEDED  . atorvastatin (LIPITOR) 20 MG tablet Take 20 mg by mouth daily.  . beclomethasone (QVAR) 80 MCG/ACT inhaler INHALE 2 PUFFS BY MOUTH INTO THE LUNGS 2 (TWO) TIMES DAILY.  Marland Kitchen Denosumab (PROLIA [place]) Inject into the skin every 6 (six) months.  . hydrochlorothiazide (HYDRODIURIL) 25 MG tablet TAKE 1 TABLET BY MOUTH ONCE A DAY AS NEEDED  . irbesartan (AVAPRO) 300 MG tablet Take 300 mg by mouth daily.   . metFORMIN (GLUCOPHAGE-XR) 500 MG 24 hr tablet Take 1 tablet by mouth daily with breakfast. Currently taking only 3 times weekly  . montelukast (SINGULAIR) 10 MG tablet Take 1 tablet (10 mg total) by mouth daily.  Marland Kitchen omalizumab (XOLAIR) 150 MG/ML prefilled syringe INJECT 300 MG INTO THE SKIN EVERY 28 DAYS.  Marland Kitchen Vitamin D, Ergocalciferol, (DRISDOL) 50000 UNITS CAPS Take 50,000 Units by mouth. Twice weekly   Current Facility-Administered Medications for the 06/18/20 encounter (Office Visit) with Vickie Epley, MD  Medication  . omalizumab Arvid Right) injection 300 mg  Allergies:   Patient has no known allergies.   Social History   Socioeconomic History  . Marital status: Widowed    Spouse name: Not on file  . Number of children: 4  . Years of education: Not on file  . Highest education level: Not on file  Occupational History  . Occupation: Programmer, multimedia: [employer]  Tobacco Use  . Smoking status: Former Smoker    Packs/day: 0.30    Years: 10.00    Pack years: 3.00    Types: Cigarettes    Start date: 46    Quit date: 02/03/1975    Years since quitting: 45.4  . Smokeless tobacco: Never Used  Substance and Sexual Activity  .  Alcohol use: No  . Drug use: No  . Sexual activity: Not on file  Other Topics Concern  . Not on file  Social History Narrative  . Not on file   Social Determinants of Health   Financial Resource Strain: Not on file  Food Insecurity: Not on file  Transportation Needs: Not on file  Physical Activity: Not on file  Stress: Not on file  Social Connections: Not on file     Family History: The patient's family history includes Asthma in an other family member; Diabetes in her brother, maternal grandfather, and maternal grandmother; Heart disease in her brother, father, and mother.  ROS:   Please see the history of present illness.    All other systems reviewed and are negative.  EKGs/Labs/Other Studies Reviewed:    The following studies were reviewed today:  May 31, 2020 exercise tolerance test Poor exercise capacity, 5.2 METS Max heart rate 91 bpm  December 06, 2018 ZIO personally reviewed Normal heart rate 39 bpm, average heart rate 49 bpm 6 patient triggered episodes corresponded to sinus bradycardia, junctional rhythm, isorhythmic dissociation   November 17, 2018 echo Left ventricular function normal, 60% Right ventricular function normal Mild MR   EKG:  The ekg ordered today demonstrates sinus bradycardia     Recent Labs: 05/18/2020: B Natriuretic Peptide 132.5; BUN 27; Creatinine, Ser 1.40; Hemoglobin 12.4; Platelets 369; Potassium 3.9; Sodium 138  Recent Lipid Panel No results found for: CHOL, TRIG, HDL, CHOLHDL, VLDL, LDLCALC, LDLDIRECT  Physical Exam:    VS:  BP 126/62   Pulse (!) 47   Ht 5\' 4"  (1.626 m)   Wt 152 lb (68.9 kg)   SpO2 98%   BMI 26.09 kg/m     Wt Readings from Last 3 Encounters:  06/18/20 152 lb (68.9 kg)  05/28/20 159 lb (72.1 kg)  05/18/20 150 lb (68 kg)     GEN:  Well nourished, well developed in no acute distress.  Appears younger than stated age. HEENT: Normal NECK: No JVD; No carotid bruits LYMPHATICS: No  lymphadenopathy CARDIAC: Regular rhythm, bradycardic, no murmurs, rubs, gallops RESPIRATORY:  Clear to auscultation without rales, wheezing or rhonchi  ABDOMEN: Soft, non-tender, non-distended MUSCULOSKELETAL:  No edema; No deformity  SKIN: Warm and dry NEUROLOGIC:  Alert and oriented x 3 PSYCHIATRIC:  Normal affect   ASSESSMENT:    1. Chronotropic incompetence   2. Essential hypertension    PLAN:    In order of problems listed above:  1. Chronotropic incompetence/sinus bradycardia Patient with asymptomatic bradycardia.  She also has clear chronotropic incompetence on a recent exercise tolerance test.  Given the lack of correlation between her symptoms and heart rate, I do not think a pacemaker is indicated at this time.  We spent some time today discussing the indication for pacemaker including correlation of symptoms to lower heart rates, syncope and presyncope.  Her AV conduction appears intact.  I think some of her symptoms that she experiences at rest when her heart rates are in the 30s are due to a junctional escape rhythm that comes and and the associated A-V dyssynchrony.  2.  Hypertension Controlled Continue current medicines including hydrochlorothiazide, irbesartan  I would recommend that she follow-up as needed.   Medication Adjustments/Labs and Tests Ordered: Current medicines are reviewed at length with the patient today.  Concerns regarding medicines are outlined above.  Orders Placed This Encounter  Procedures  . EKG 12-Lead   No orders of the defined types were placed in this encounter.    Signed, Hilton Cork. Quentin Ore, MD, [facility], [facility] 06/18/2020 9:48 AM    Electrophysiology [facility]

## 2020-06-18 ENCOUNTER — Ambulatory Visit (INDEPENDENT_AMBULATORY_CARE_PROVIDER_SITE_OTHER): Payer: 59 | Admitting: Cardiology

## 2020-06-18 ENCOUNTER — Encounter: Payer: Self-pay | Admitting: Cardiology

## 2020-06-18 ENCOUNTER — Other Ambulatory Visit: Payer: Self-pay

## 2020-06-18 VITALS — BP 126/62 | HR 47 | Ht 64.0 in | Wt 152.0 lb

## 2020-06-18 DIAGNOSIS — I10 Essential (primary) hypertension: Secondary | ICD-10-CM | POA: Diagnosis not present

## 2020-06-18 DIAGNOSIS — I4589 Other specified conduction disorders: Secondary | ICD-10-CM

## 2020-06-18 NOTE — Patient Instructions (Addendum)
Medication Instructions:  Your physician recommends that you continue on your current medications as directed. Please refer to the Current Medication list given to you today.  Labwork: None ordered.  Testing/Procedures: None ordered.  Follow-Up: Your physician wants you to follow-up in: As needed with  Cameron Lambert, MD    Any Other Special Instructions Will Be Listed Below (If Applicable).  If you need a refill on your cardiac medications before your next appointment, please call your pharmacy.        

## 2020-06-20 ENCOUNTER — Other Ambulatory Visit (HOSPITAL_COMMUNITY): Payer: Self-pay

## 2020-06-25 ENCOUNTER — Encounter: Payer: Self-pay | Admitting: Gastroenterology

## 2020-07-08 ENCOUNTER — Other Ambulatory Visit (HOSPITAL_COMMUNITY): Payer: Self-pay

## 2020-07-08 MED FILL — Omalizumab Subcutaneous Soln Prefilled Syringe 150 MG/ML: SUBCUTANEOUS | 28 days supply | Qty: 2 | Fill #1 | Status: AC

## 2020-07-16 ENCOUNTER — Other Ambulatory Visit (HOSPITAL_BASED_OUTPATIENT_CLINIC_OR_DEPARTMENT_OTHER): Payer: Self-pay

## 2020-07-16 MED FILL — Hydrochlorothiazide Tab 25 MG: ORAL | 90 days supply | Qty: 90 | Fill #0 | Status: AC

## 2020-07-23 ENCOUNTER — Encounter: Payer: Self-pay | Admitting: Gastroenterology

## 2020-07-23 ENCOUNTER — Other Ambulatory Visit: Payer: Self-pay

## 2020-07-23 ENCOUNTER — Ambulatory Visit (INDEPENDENT_AMBULATORY_CARE_PROVIDER_SITE_OTHER): Payer: 59 | Admitting: Gastroenterology

## 2020-07-23 VITALS — BP 142/64 | HR 61 | Ht 63.0 in | Wt 153.0 lb

## 2020-07-23 DIAGNOSIS — R1310 Dysphagia, unspecified: Secondary | ICD-10-CM

## 2020-07-23 DIAGNOSIS — Z8719 Personal history of other diseases of the digestive system: Secondary | ICD-10-CM | POA: Diagnosis not present

## 2020-07-23 NOTE — Progress Notes (Signed)
07/23/2020 Elaine Luna [id number] 1945-11-14   HISTORY OF PRESENT ILLNESS: This is a 75 year old female who is a patient of Dr. Blanch Media known to him remotely for complaints of dysphagia.  She had an EGD in 2011 at which time she was found to have an esophageal stricture in the distal esophagus that was dilated with [place].  Also noted to have GERD.  She is here again today with complaints of food getting stuck intermittently.  She says that foods such as steak, shrimp, chicken, clams get stuck periodically.  Not all the time.  No issues with eating bread, pasta, etc.  She denies any overt heartburn or reflux type symptoms and is not on any type of acid reflux regimen.  Last colonoscopy was in 2007 at which time she had a couple of tubular adenomas removed.  She is declining repeat colonoscopy at this time.  Referred here by her PCP, Dr. Virgina Jock, for complaints of dysphagia.   Past Medical History:  Diagnosis Date   Asthma    Colon polyps    DM type 2 (diabetes mellitus, type 2) ([place]) 2015    Hyperlipidemia    Hypertension    Obesity    Pulmonary embolism associated with COVID-19 ([place]) 12/25/2018   Tubular adenoma    Past Surgical History:  Procedure Laterality Date   APPENDECTOMY     CARDIAC CATHETERIZATION     CATARACT EXTRACTION, BILATERAL  2011   CHOLECYSTECTOMY     ESOPHAGOGASTRODUODENOSCOPY  2011   TONSILLECTOMY     VESICOVAGINAL FISTULA CLOSURE W/ TAH      reports that she quit smoking about 45 years ago. Her smoking use included cigarettes. She started smoking about 55 years ago. She has a 3.00 pack-year smoking history. She has never used smokeless tobacco. She reports that she does not drink alcohol and does not use drugs. family history includes Asthma in an other family member; Diabetes in her brother, maternal grandfather, and maternal grandmother; Heart disease in her brother, father, and mother. No Known Allergies    Outpatient Encounter  Medications as of 07/23/2020  Medication Sig   albuterol (PROVENTIL) (2.5 MG/3ML) 0.083% nebulizer solution USE 1 VIAL IN NEBULIZER EVERY 6 HOURS AS NEEDED FOR WHEEZING.   albuterol (VENTOLIN HFA) 108 (90 Base) MCG/ACT inhaler INHALE 1-2 PUFFS BY MOUTH 4 TIMES DAILY AS NEEDED   atorvastatin (LIPITOR) 20 MG tablet Take 20 mg by mouth daily.   beclomethasone (QVAR) 80 MCG/ACT inhaler INHALE 2 PUFFS BY MOUTH INTO THE LUNGS 2 (TWO) TIMES DAILY.   Denosumab (PROLIA [place]) Inject into the skin every 6 (six) months.   hydrochlorothiazide (HYDRODIURIL) 25 MG tablet TAKE 1 TABLET BY MOUTH ONCE A DAY AS NEEDED   irbesartan (AVAPRO) 300 MG tablet Take 300 mg by mouth daily.    metFORMIN (GLUCOPHAGE-XR) 500 MG 24 hr tablet Take 1 tablet by mouth daily with breakfast. Currently taking only 3 times weekly   montelukast (SINGULAIR) 10 MG tablet Take 1 tablet (10 mg total) by mouth daily.   omalizumab (XOLAIR) 150 MG/ML prefilled syringe INJECT 300 MG INTO THE SKIN EVERY 28 DAYS.   Vitamin D, Ergocalciferol, (DRISDOL) 50000 UNITS CAPS Take 50,000 Units by mouth. Twice weekly   Facility-Administered Encounter Medications as of 07/23/2020  Medication   omalizumab [place] Right) injection 300 mg     REVIEW OF SYSTEMS  : All other systems reviewed and negative except where noted in the History of Present Illness.   PHYSICAL EXAM:  BP (!) 142/64   Pulse 61   Ht 5\' 3"  (1.6 m)   Wt 153 lb (69.4 kg)   BMI 27.10 kg/m  General: Well developed white female in no acute distress Head: Normocephalic and atraumatic Eyes:  Sclerae anicteric, conjunctiva pink. Ears: Normal auditory acuity Lungs: Clear throughout to auscultation; no W/R/R. Heart: Regular rate and rhythm; no M/R/G. Abdomen: Soft, non-distended.  BS present.  Non-tender. Musculoskeletal: Symmetrical with no gross deformities  Skin: No lesions on visible extremities Extremities: No edema  Neurological: Alert oriented x 4, grossly non-focal Psychological:   Alert and cooperative. Normal mood and affect  ASSESSMENT AND PLAN: *75 year old female with complaints of intermittent dysphagia to solid foods such as steak, shrimp, chicken, clams.  Has history of esophageal stricture that was dilated in 2011.  Question recurrence versus esophageal dysmotility.  We will proceed with another EGD with dilation with Dr. Henrene Pastor.  She denies any overt heartburn or reflux symptoms.  The risks, benefits, and alternatives to EGD with dilation were discussed with the patient and she consents to proceed.  *Personal history of adenomatous colon polyps: Last colonoscopy 2007.  Patient declining another colonoscopy at this time.   CC:  Shon Baton, MD

## 2020-07-23 NOTE — Progress Notes (Signed)
Noted  

## 2020-07-23 NOTE — Patient Instructions (Signed)
You have been scheduled for an endoscopy. Please follow written instructions given to you at your visit today. If you use inhalers (even only as needed), please bring them with you on the day of your procedure.  If you are age 77 or older, your body mass index should be between 23-30. Your Body mass index is 27.1 kg/m. If this is out of the aforementioned range listed, please consider follow up with your Primary Care Provider. ____________________________________________  The [facility] providers would like to encourage you to use [facility] to communicate with providers for non-urgent requests or questions.  Due to long hold times on the telephone, sending your provider a message by [facility] may be a faster and more efficient way to get a response.  Please allow 48 business hours for a response.  Please remember that this is for non-urgent requests.   Due to recent changes in healthcare laws, you may see the results of your imaging and laboratory studies on MyChart before your provider has had a chance to review them.  We understand that in some cases there may be results that are confusing or concerning to you. Not all laboratory results come back in the same time frame and the provider may be waiting for multiple results in order to interpret others.  Please give [place] 48 hours in order for your provider to thoroughly review all the results before contacting the office for clarification of your results.

## 2020-07-30 ENCOUNTER — Other Ambulatory Visit: Payer: Self-pay | Admitting: Pulmonary Disease

## 2020-07-30 ENCOUNTER — Other Ambulatory Visit (HOSPITAL_COMMUNITY): Payer: Self-pay

## 2020-07-30 DIAGNOSIS — N1832 Chronic kidney disease, stage 3b: Secondary | ICD-10-CM | POA: Diagnosis not present

## 2020-07-30 DIAGNOSIS — E669 Obesity, unspecified: Secondary | ICD-10-CM | POA: Diagnosis not present

## 2020-07-30 DIAGNOSIS — C439 Malignant melanoma of skin, unspecified: Secondary | ICD-10-CM | POA: Diagnosis not present

## 2020-07-30 DIAGNOSIS — M810 Age-related osteoporosis without current pathological fracture: Secondary | ICD-10-CM | POA: Diagnosis not present

## 2020-07-30 DIAGNOSIS — I129 Hypertensive chronic kidney disease with stage 1 through stage 4 chronic kidney disease, or unspecified chronic kidney disease: Secondary | ICD-10-CM | POA: Diagnosis not present

## 2020-07-30 DIAGNOSIS — E785 Hyperlipidemia, unspecified: Secondary | ICD-10-CM | POA: Diagnosis not present

## 2020-07-30 DIAGNOSIS — I700 Atherosclerosis of aorta: Secondary | ICD-10-CM | POA: Diagnosis not present

## 2020-07-30 DIAGNOSIS — E1122 Type 2 diabetes mellitus with diabetic chronic kidney disease: Secondary | ICD-10-CM | POA: Diagnosis not present

## 2020-07-30 DIAGNOSIS — F4321 Adjustment disorder with depressed mood: Secondary | ICD-10-CM | POA: Diagnosis not present

## 2020-07-31 ENCOUNTER — Other Ambulatory Visit (HOSPITAL_COMMUNITY): Payer: Self-pay

## 2020-07-31 ENCOUNTER — Other Ambulatory Visit (HOSPITAL_BASED_OUTPATIENT_CLINIC_OR_DEPARTMENT_OTHER): Payer: Self-pay

## 2020-07-31 MED FILL — Irbesartan Tab 300 MG: ORAL | 90 days supply | Qty: 90 | Fill #0 | Status: CN

## 2020-07-31 MED FILL — Irbesartan Tab 300 MG: ORAL | 90 days supply | Qty: 90 | Fill #0 | Status: AC

## 2020-07-31 MED FILL — Beclomethasone Diprop HFA Breath Act Inh Aer 80 MCG/ACT: RESPIRATORY_TRACT | 30 days supply | Qty: 10.6 | Fill #0 | Status: AC

## 2020-08-01 ENCOUNTER — Other Ambulatory Visit (HOSPITAL_BASED_OUTPATIENT_CLINIC_OR_DEPARTMENT_OTHER): Payer: Self-pay

## 2020-08-06 ENCOUNTER — Other Ambulatory Visit (HOSPITAL_COMMUNITY): Payer: Self-pay

## 2020-08-06 ENCOUNTER — Other Ambulatory Visit: Payer: Self-pay | Admitting: Pulmonary Disease

## 2020-08-07 ENCOUNTER — Telehealth: Payer: Self-pay | Admitting: Pharmacist

## 2020-08-07 ENCOUNTER — Other Ambulatory Visit (HOSPITAL_COMMUNITY): Payer: Self-pay

## 2020-08-07 MED ORDER — OMALIZUMAB 150 MG/ML ~~LOC~~ SOSY
PREFILLED_SYRINGE | SUBCUTANEOUS | 12 refills | Status: DC
  Filled 2020-08-07: qty 2, fill #0

## 2020-08-07 NOTE — Telephone Encounter (Signed)
Called patient to schedule an appointment for the [facility]. I was unable to reach the patient so I left a HIPAA-compliant message requesting that the patient return my call.   Luke Van Ausdall, PharmD, BCACP, CPP Clinical Pharmacist [facility] [phone]  

## 2020-08-07 NOTE — Telephone Encounter (Signed)
Signed refill for Xolair for patient. On review of chart, patient has not been seen in clinic for 1 year and receiving home injections. Please schedule patient for follow-up with me when next available.

## 2020-08-08 ENCOUNTER — Other Ambulatory Visit: Payer: Self-pay | Admitting: Pharmacist

## 2020-08-08 ENCOUNTER — Other Ambulatory Visit (HOSPITAL_COMMUNITY): Payer: Self-pay

## 2020-08-08 ENCOUNTER — Ambulatory Visit: Payer: 59 | Attending: Internal Medicine | Admitting: Pharmacist

## 2020-08-08 ENCOUNTER — Other Ambulatory Visit: Payer: Self-pay

## 2020-08-08 DIAGNOSIS — Z7189 Other specified counseling: Secondary | ICD-10-CM

## 2020-08-08 MED ORDER — OMALIZUMAB 150 MG/ML ~~LOC~~ SOSY
PREFILLED_SYRINGE | SUBCUTANEOUS | 12 refills | Status: DC
  Filled 2020-08-08: qty 2, 28d supply, fill #0
  Filled 2020-09-02 – 2020-09-03 (×2): qty 2, 28d supply, fill #1
  Filled 2020-09-27: qty 2, 28d supply, fill #2
  Filled 2020-10-24: qty 2, 28d supply, fill #3
  Filled 2020-11-21: qty 2, 28d supply, fill #4

## 2020-08-08 NOTE — Progress Notes (Signed)
   S: Patient presents for review of their specialty medication therapy.  Patient is currently taking Xolair for asthma. Patient is managed by Dr. Loanne Drilling for this.   Adherence: confirms  Efficacy: reports that it has worked very well for her.   Dosing: Give subcutaneously. Can be dosed every 2 or 4 weeks based on baseline serum IgE levels and body weight.  Asthma: SubQ: current dose is 300 mg q14days  Dose adjustments: Renal: no dose adjustments  Hepatic: no dose adjustments  Toxicity: Severe hypersensitivity reaction or anaphylaxis: Discontinue treatment. Fever, arthralgia, and rash: Discontinue treatment if this constellation of symptoms occurs.  Drug-drug interactions: none  Monitoring: CV effects: none  Eosinophilia and vasculitis: none; CBC w/ diff WNL 05/18/2020 Fever/arthralgia/rash: none Hypersensitivity/Anaphylaxis: none Malignant neoplasms: none   O: Lab Results  Component Value Date   WBC 8.6 05/18/2020   HGB 12.4 05/18/2020   HCT 36.4 05/18/2020   MCV 89.7 05/18/2020   PLT 369 05/18/2020      Chemistry      Component Value Date/Time   NA 138 05/18/2020 1322   K 3.9 05/18/2020 1322   CL 104 05/18/2020 1322   CO2 24 05/18/2020 1322   BUN 27 (H) 05/18/2020 1322   CREATININE 1.40 (H) 05/18/2020 1322      Component Value Date/Time   CALCIUM 9.3 05/18/2020 1322   ALKPHOS 83 02/27/2008 1205   AST 21 02/27/2008 1205   ALT 23 02/27/2008 1205   BILITOT 1.4 (H) 02/27/2008 1205       A/P: 1. Medication review: patient currently on Xolair for asthma and is tolerating it well with good efficacy. Reviewed the medication with the patient, including the following: Xolair, omalizumab, is a novel IgE blocker.  It appears to reduce rates of hospitalizations, ER visits and unscheduled physician visits due asthma exacerbations when added to standard therapy.  Studies also show a reduction in steroid requirements and improvement in quality of life.  Patient educated  on purpose, proper use and potential adverse effects of Xolair.  Following instruction patient verbalized understanding. Patient should always have an EpiPen readily available in the event of anaphylaxis. SubQ: For SubQ injection only; doses >150 mg should be divided over more than one injection site (eg, 225 mg or 300 mg administered as two injections, 375 mg administered as three injections); each injection site should be separated by ?1 inch. Do not inject into moles, scars, bruises, tender areas, or broken skin. Injections may take 5 to 10 seconds to administer (solution is slightly viscous). Administer only under direct medical supervision and observe patient for 2 hours after the first 3 injections and 30 minutes after subsequent injections [place] [place] 2015) or in accordance with individual institution policies and procedures.No recommendations for any changes at this time.   Benard Halsted, PharmD, [place], [place] [phone]

## 2020-08-09 ENCOUNTER — Other Ambulatory Visit (HOSPITAL_COMMUNITY): Payer: Self-pay

## 2020-08-13 ENCOUNTER — Other Ambulatory Visit (HOSPITAL_COMMUNITY): Payer: Self-pay

## 2020-08-19 ENCOUNTER — Other Ambulatory Visit (HOSPITAL_COMMUNITY): Payer: Self-pay | Admitting: *Deleted

## 2020-08-20 ENCOUNTER — Ambulatory Visit (HOSPITAL_COMMUNITY)
Admission: RE | Admit: 2020-08-20 | Discharge: 2020-08-20 | Disposition: A | Discharge status: Home / Self Care | Payer: Medicare Other | Source: Ambulatory Visit | Attending: Internal Medicine | Admitting: Internal Medicine

## 2020-08-20 ENCOUNTER — Other Ambulatory Visit (HOSPITAL_COMMUNITY): Payer: Self-pay

## 2020-08-20 DIAGNOSIS — M810 Age-related osteoporosis without current pathological fracture: Secondary | ICD-10-CM | POA: Insufficient documentation

## 2020-08-20 MED ORDER — DENOSUMAB 60 MG/ML ~~LOC~~ SOSY
PREFILLED_SYRINGE | SUBCUTANEOUS | Status: AC
  Filled 2020-08-20: qty 1

## 2020-08-20 MED ORDER — DENOSUMAB 60 MG/ML ~~LOC~~ SOSY
60.0000 mg | PREFILLED_SYRINGE | Freq: Once | SUBCUTANEOUS | Status: AC
  Administered 2020-08-20: 60 mg via SUBCUTANEOUS

## 2020-08-22 ENCOUNTER — Other Ambulatory Visit (HOSPITAL_BASED_OUTPATIENT_CLINIC_OR_DEPARTMENT_OTHER): Payer: Self-pay

## 2020-08-22 MED ORDER — ATORVASTATIN CALCIUM 20 MG PO TABS
20.0000 mg | ORAL_TABLET | Freq: Every day | ORAL | 3 refills | Status: DC
  Filled 2020-08-22: qty 90, 90d supply, fill #0

## 2020-09-02 ENCOUNTER — Other Ambulatory Visit (HOSPITAL_COMMUNITY): Payer: Self-pay

## 2020-09-03 ENCOUNTER — Telehealth: Payer: Self-pay

## 2020-09-03 ENCOUNTER — Other Ambulatory Visit (HOSPITAL_COMMUNITY): Payer: Self-pay

## 2020-09-03 NOTE — Telephone Encounter (Signed)
Received notification from [place]  regarding a prior authorization for XOLAIR. Authorization has been APPROVED from 09/03/2020 to 03/06/2021.   Patient can continue to fill through [place]: [phone]   Authorization # [id number] [id number]

## 2020-09-03 NOTE — Telephone Encounter (Signed)
Submitted a Prior Authorization request to [facility]  for XOLAIR via CoverMyMeds. Will update once we receive a response.   Key: Lorina Rabon

## 2020-09-27 ENCOUNTER — Other Ambulatory Visit (HOSPITAL_COMMUNITY): Payer: Self-pay

## 2020-10-01 ENCOUNTER — Other Ambulatory Visit (HOSPITAL_COMMUNITY): Payer: Self-pay

## 2020-10-08 ENCOUNTER — Ambulatory Visit (AMBULATORY_SURGERY_CENTER): Payer: Medicare Other | Admitting: Internal Medicine

## 2020-10-08 ENCOUNTER — Other Ambulatory Visit: Payer: Self-pay

## 2020-10-08 ENCOUNTER — Other Ambulatory Visit (HOSPITAL_BASED_OUTPATIENT_CLINIC_OR_DEPARTMENT_OTHER): Payer: Self-pay

## 2020-10-08 ENCOUNTER — Encounter: Payer: Self-pay | Admitting: Internal Medicine

## 2020-10-08 VITALS — BP 111/48 | HR 52 | Temp 97.5°F | Resp 20 | Ht 63.0 in | Wt 153.0 lb

## 2020-10-08 DIAGNOSIS — R1310 Dysphagia, unspecified: Secondary | ICD-10-CM | POA: Diagnosis not present

## 2020-10-08 DIAGNOSIS — K222 Esophageal obstruction: Secondary | ICD-10-CM

## 2020-10-08 DIAGNOSIS — Z8719 Personal history of other diseases of the digestive system: Secondary | ICD-10-CM

## 2020-10-08 MED ORDER — PANTOPRAZOLE SODIUM 40 MG PO TBEC
40.0000 mg | DELAYED_RELEASE_TABLET | Freq: Every day | ORAL | 3 refills | Status: DC
  Filled 2020-10-08: qty 90, 90d supply, fill #0

## 2020-10-08 NOTE — Progress Notes (Signed)
Called to room to assist during endoscopic procedure.  Patient ID and intended procedure confirmed with present staff. Received instructions for my participation in the procedure from the performing physician.  

## 2020-10-08 NOTE — Progress Notes (Signed)
HISTORY OF PRESENT ILLNESS:  Elaine Luna is a 75 y.o. female who was seen by the GI physician assistant July 23, 2020 regarding dysphagia.  See that dictation for details.  There have been no interval changes in her clinical status.  She is now for upper endoscopy with probable esophageal dilation  REVIEW OF SYSTEMS:  All non-GI ROS negative. Past Medical History:  Diagnosis Date   Asthma    Colon polyps    DM type 2 (diabetes mellitus, type 2) ([place]) 2015    Hyperlipidemia    Hypertension    Obesity    Pulmonary embolism associated with COVID-19 ([place]) 12/25/2018   Tubular adenoma     Past Surgical History:  Procedure Laterality Date   APPENDECTOMY     CARDIAC CATHETERIZATION     CATARACT EXTRACTION, BILATERAL  2011   CHOLECYSTECTOMY     ESOPHAGOGASTRODUODENOSCOPY  2011   TONSILLECTOMY     VESICOVAGINAL FISTULA CLOSURE W/ TAH      Social History Elaine Luna  reports that she quit smoking about 45 years ago. Her smoking use included cigarettes. She started smoking about 55 years ago. She has a 3.00 pack-year smoking history. She has never used smokeless tobacco. She reports that she does not drink alcohol and does not use drugs.  family history includes Asthma in an other family member; Diabetes in her brother, maternal grandfather, and maternal grandmother; Heart disease in her brother, father, and mother.  No Known Allergies     PHYSICAL EXAMINATION:  Vital signs: BP 124/68   Pulse (!) 51   Temp (!) 97.5 F (36.4 C) (Temporal)   Ht '5\' 3"'$  (1.6 m)   Wt 153 lb (69.4 kg)   SpO2 99%   BMI 27.10 kg/m  General: Well-developed, well-nourished, no acute distress HEENT: Sclerae are anicteric, conjunctiva pink. Oral mucosa intact Lungs: Clear Heart: Regular Abdomen: soft, nontender, nondistended, no obvious ascites, no peritoneal signs, normal bowel sounds. No organomegaly. Extremities: No edema Psychiatric: alert and oriented x3. Cooperative      ASSESSMENT:  1.  Admit solid food dysphagia rule out peptic stricture 2.  History of peptic stricture status post remote dilation 3.  History of adenomatous colon polyps.  Has declined surveillance colonoscopy   PLAN:  1.  Upper endoscopy with probable esophageal dilation.The nature of the procedure, as well as the risks, benefits, and alternatives were carefully and thoroughly reviewed with the patient. Ample time for discussion and questions allowed. The patient understood, was satisfied, and agreed to proceed.

## 2020-10-08 NOTE — Progress Notes (Signed)
Sedate, gd SR, tolerated procedure well, VSS, report to RN 

## 2020-10-08 NOTE — Progress Notes (Signed)
Pt's states no medical or surgical changes since previsit or office visit. 

## 2020-10-08 NOTE — Op Note (Signed)
[place] Patient Name: Elaine Luna Procedure Date: 10/08/2020 10:16 AM MRN: [id number] Endoscopist: Docia Chuck. Henrene Pastor , MD Age: 75 Referring MD:  Date of Birth: 02-28-1945 Gender: Female Account #: [id number] Procedure:                Upper GI endoscopy with balloon dilation of the                            esophagus?"19 mm max Indications:              Dysphagia. History of peptic stricture Medicines:                Monitored Anesthesia Care Procedure:                Pre-Anesthesia Assessment:                           - Prior to the procedure, a History and Physical                            was performed, and patient medications and                            allergies were reviewed. The patient's tolerance of                            previous anesthesia was also reviewed. The risks                            and benefits of the procedure and the sedation                            options and risks were discussed with the patient.                            All questions were answered, and informed consent                            was obtained. Prior Anticoagulants: The patient has                            taken no previous anticoagulant or antiplatelet                            agents. ASA Grade Assessment: II - A patient with                            mild systemic disease. After reviewing the risks                            and benefits, the patient was deemed in                            satisfactory condition to undergo the procedure.  After obtaining informed consent, the endoscope was                            passed under direct vision. Throughout the                            procedure, the patient's blood pressure, pulse, and                            oxygen saturations were monitored continuously. The                            Endoscope was introduced through the mouth, and                            advanced to the  second part of duodenum. The upper                            GI endoscopy was accomplished without difficulty.                            The patient tolerated the procedure well. Scope In: Scope Out: Findings:                 One benign-appearing, intrinsic ringlike moderate                            stenosis was found 36 cm from the incisors. This                            stenosis measured 1.4 cm (inner diameter). After                            completing the entire endoscopic survey, A TTS                            dilator was passed through the scope. Dilation with                            an 18-19-20 mm balloon dilator was performed to 19                            mm. Good disruption of the ringlike stricture was                            noted.                           The exam of the esophagus revealed esophagitis in                            the region of the stricture.  The stomach revealed a small sliding hiatal hernia                            but was otherwise normal.                           The examined duodenum was normal.                           The cardia and gastric fundus were normal on                            retroflexion. Complications:            No immediate complications. Estimated Blood Loss:     Estimated blood loss: none. Impression:               - Benign-appearing esophageal stenosis with                            esophagitis. Dilated.                           - Normal stomach save small hiatal hernia.                           - Normal examined duodenum.                           - No specimens collected. Recommendation:           1. Patient has a contact number available for                            emergencies. The signs and symptoms of potential                            delayed complications were discussed with the                            patient. Return to normal activities tomorrow.                             Written discharge instructions were provided to the                            patient.                           2. Post dilation diet.                           3. Prescribe pantoprazole 40 mg daily; #30; 11                            refills  4. Routine office follow-up with Dr. Henrene Pastor in 6 to                            8 weeks. Docia Chuck. Henrene Pastor, MD 10/08/2020 10:50:18 AM This report has been signed electronically.

## 2020-10-08 NOTE — Patient Instructions (Signed)
Discharge instructions given. Handouts on esophagitis,hiatal hernia and a dilatation diet. Prescription sent to pharmacy. Office will call to set up routine office follow up with Dr. Henrene Pastor in 6-8 weeks. Resume previous medications. YOU HAD AN ENDOSCOPIC PROCEDURE TODAY AT [facility]:   Refer to the procedure report that was given to you for any specific questions about what was found during the examination.  If the procedure report does not answer your questions, please call your gastroenterologist to clarify.  If you requested that your care partner not be given the details of your procedure findings, then the procedure report has been included in a sealed envelope for you to review at your convenience later.  YOU SHOULD EXPECT: Some feelings of bloating in the abdomen. Passage of more gas than usual.  Walking can help get rid of the air that was put into your GI tract during the procedure and reduce the bloating. If you had a lower endoscopy (such as a colonoscopy or flexible sigmoidoscopy) you may notice spotting of blood in your stool or on the toilet paper. If you underwent a bowel prep for your procedure, you may not have a normal bowel movement for a few days.  Please Note:  You might notice some irritation and congestion in your nose or some drainage.  This is from the oxygen used during your procedure.  There is no need for concern and it should clear up in a day or so.  SYMPTOMS TO REPORT IMMEDIATELY:   Following upper endoscopy (EGD)  Vomiting of blood or coffee ground material  New chest pain or pain under the shoulder blades  Painful or persistently difficult swallowing  New shortness of breath  Fever of 100F or higher  Black, tarry-looking stools  For urgent or emergent issues, a gastroenterologist can be reached at any hour by calling [phone]. Do not use MyChart messaging for urgent concerns.    DIET:  We do recommend a small meal at first, but then  you may proceed to your regular diet.  Drink plenty of fluids but you should avoid alcoholic beverages for 24 hours.  ACTIVITY:  You should plan to take it easy for the rest of today and you should NOT DRIVE or use heavy machinery until tomorrow (because of the sedation medicines used during the test).    FOLLOW UP: Our staff will call the number listed on your records 48-72 hours following your procedure to check on you and address any questions or concerns that you may have regarding the information given to you following your procedure. If we do not reach you, we will leave a message.  We will attempt to reach you two times.  During this call, we will ask if you have developed any symptoms of COVID 19. If you develop any symptoms (ie: fever, flu-like symptoms, shortness of breath, cough etc.) before then, please call [phone].  If you test positive for Covid 19 in the 2 weeks post procedure, please call and report this information to [place].    If any biopsies were taken you will be contacted by phone or by letter within the next 1-3 weeks.  Please call us at [phone] if you have not heard about the biopsies in 3 weeks.    SIGNATURES/CONFIDENTIALITY: You and/or your care partner have signed paperwork which will be entered into your electronic medical record.  These signatures attest to the fact that that the information above on your After Visit Summary has been  reviewed and is understood.  Full responsibility of the confidentiality of this discharge information lies with you and/or your care-partner.

## 2020-10-09 ENCOUNTER — Other Ambulatory Visit (HOSPITAL_COMMUNITY): Payer: Self-pay

## 2020-10-09 ENCOUNTER — Telehealth: Payer: Self-pay

## 2020-10-09 NOTE — Telephone Encounter (Signed)
Pt scheduled for a follow up visit with Dr. Henrene Pastor  Nov 20, 2020 @ 2:40. Pt made aware

## 2020-10-10 ENCOUNTER — Other Ambulatory Visit (HOSPITAL_COMMUNITY): Payer: Self-pay

## 2020-10-10 ENCOUNTER — Telehealth: Payer: Self-pay

## 2020-10-10 MED ORDER — VITAMIN D (ERGOCALCIFEROL) 1.25 MG (50000 UNIT) PO CAPS
50000.0000 [IU] | ORAL_CAPSULE | ORAL | 3 refills | Status: DC
  Filled 2020-10-10 – 2020-10-15 (×2): qty 24, 84d supply, fill #0
  Filled 2020-12-18: qty 24, 84d supply, fill #1
  Filled 2021-05-20: qty 24, 84d supply, fill #2

## 2020-10-10 NOTE — Telephone Encounter (Signed)
  Follow up Call-  Call back number 10/08/2020  Post procedure Call Back phone  # [phone]  Permission to leave phone message Yes  Some recent data might be hidden     Patient questions:  Do you have a fever, pain , or abdominal swelling? No. Pain Score  0 *  Have you tolerated food without any problems? Yes.    Have you been able to return to your normal activities? Yes.    Do you have any questions about your discharge instructions: Diet   No. Medications  No. Follow up visit  No.  Do you have questions or concerns about your Care? No.  Actions: * If pain score is 4 or above: No action needed, pain <4.

## 2020-10-15 ENCOUNTER — Other Ambulatory Visit (HOSPITAL_BASED_OUTPATIENT_CLINIC_OR_DEPARTMENT_OTHER): Payer: Self-pay

## 2020-10-16 ENCOUNTER — Other Ambulatory Visit (HOSPITAL_COMMUNITY): Payer: Self-pay

## 2020-10-23 ENCOUNTER — Other Ambulatory Visit (HOSPITAL_COMMUNITY): Payer: Self-pay

## 2020-10-24 ENCOUNTER — Other Ambulatory Visit (HOSPITAL_COMMUNITY): Payer: Self-pay

## 2020-10-29 ENCOUNTER — Other Ambulatory Visit (HOSPITAL_COMMUNITY): Payer: Self-pay

## 2020-11-05 ENCOUNTER — Other Ambulatory Visit (HOSPITAL_BASED_OUTPATIENT_CLINIC_OR_DEPARTMENT_OTHER): Payer: Self-pay

## 2020-11-05 MED ORDER — FLUAD QUADRIVALENT 0.5 ML IM PRSY
PREFILLED_SYRINGE | INTRAMUSCULAR | 0 refills | Status: DC
  Filled 2020-11-05: qty 0.5, 1d supply, fill #0

## 2020-11-08 ENCOUNTER — Other Ambulatory Visit (HOSPITAL_BASED_OUTPATIENT_CLINIC_OR_DEPARTMENT_OTHER): Payer: Self-pay

## 2020-11-08 ENCOUNTER — Other Ambulatory Visit (HOSPITAL_COMMUNITY): Payer: Self-pay

## 2020-11-08 ENCOUNTER — Other Ambulatory Visit: Payer: Self-pay | Admitting: Pulmonary Disease

## 2020-11-08 MED ORDER — MONTELUKAST SODIUM 10 MG PO TABS
10.0000 mg | ORAL_TABLET | Freq: Every day | ORAL | 0 refills | Status: DC
  Filled 2020-11-08: qty 90, 90d supply, fill #0

## 2020-11-08 MED ORDER — MONTELUKAST SODIUM 10 MG PO TABS
10.0000 mg | ORAL_TABLET | Freq: Every day | ORAL | 0 refills | Status: DC
  Filled 2020-11-08 (×2): qty 90, 90d supply, fill #0

## 2020-11-08 MED FILL — Hydrochlorothiazide Tab 25 MG: ORAL | 90 days supply | Qty: 90 | Fill #1 | Status: AC

## 2020-11-08 NOTE — Addendum Note (Signed)
Addended by: Dessie Coma on: 11/08/2020 10:24 AM   Modules accepted: Orders

## 2020-11-13 ENCOUNTER — Ambulatory Visit (INDEPENDENT_AMBULATORY_CARE_PROVIDER_SITE_OTHER): Payer: Medicare Other | Admitting: Psychology

## 2020-11-13 DIAGNOSIS — F4323 Adjustment disorder with mixed anxiety and depressed mood: Secondary | ICD-10-CM | POA: Diagnosis not present

## 2020-11-20 ENCOUNTER — Other Ambulatory Visit (HOSPITAL_BASED_OUTPATIENT_CLINIC_OR_DEPARTMENT_OTHER): Payer: Self-pay

## 2020-11-20 ENCOUNTER — Ambulatory Visit (INDEPENDENT_AMBULATORY_CARE_PROVIDER_SITE_OTHER): Payer: Medicare Other | Admitting: Internal Medicine

## 2020-11-20 ENCOUNTER — Encounter: Payer: Self-pay | Admitting: Internal Medicine

## 2020-11-20 ENCOUNTER — Ambulatory Visit: Payer: Medicare Other

## 2020-11-20 VITALS — BP 120/70 | HR 49 | Ht 63.0 in | Wt 147.0 lb

## 2020-11-20 DIAGNOSIS — K219 Gastro-esophageal reflux disease without esophagitis: Secondary | ICD-10-CM

## 2020-11-20 DIAGNOSIS — R1310 Dysphagia, unspecified: Secondary | ICD-10-CM

## 2020-11-20 DIAGNOSIS — K222 Esophageal obstruction: Secondary | ICD-10-CM | POA: Diagnosis not present

## 2020-11-20 MED ORDER — PANTOPRAZOLE SODIUM 40 MG PO TBEC
40.0000 mg | DELAYED_RELEASE_TABLET | Freq: Every day | ORAL | 3 refills | Status: DC
  Filled 2020-11-20 – 2021-03-04 (×2): qty 90, 90d supply, fill #0
  Filled 2021-07-09: qty 90, 90d supply, fill #1

## 2020-11-20 NOTE — Progress Notes (Signed)
HISTORY OF PRESENT ILLNESS:  Elaine Luna is a 75 y.o. female with a history of GERD complicated by peptic stricture who was evaluated in the office July 23, 2020 with recurrent intermittent solid food dysphagia.  She subsequently underwent upper endoscopy October 08, 2020.  She was found to have a distal esophageal stricture as well as esophagitis.  The stricture was dilated with a 19 mm balloon.  She was placed on pantoprazole 40 mg daily.  Follow-up at this time recommended.  The patient is pleased to report that she has had no further dysphagia.  No reflux symptoms on pantoprazole.  She is tolerating her medication without issues.  Her last colonoscopy was October 2007.  She was found to have adenomatous colon polyps.  She was offered follow-up surveillance colonoscopy recently but declined.  Review of blood work from April 2022 shows normal hemoglobin of 12.4  REVIEW OF SYSTEMS:  All non-GI ROS negative unless otherwise stated in the HPI.  Past Medical History:  Diagnosis Date   Asthma    Colon polyps    DM type 2 (diabetes mellitus, type 2) ([place]) 2015    Hyperlipidemia    Hypertension    Obesity    Pulmonary embolism associated with COVID-19 ([place]) 12/25/2018   Tubular adenoma     Past Surgical History:  Procedure Laterality Date   APPENDECTOMY     CARDIAC CATHETERIZATION     CATARACT EXTRACTION, BILATERAL  2011   CHOLECYSTECTOMY     ESOPHAGOGASTRODUODENOSCOPY  2011   TONSILLECTOMY     VESICOVAGINAL FISTULA CLOSURE W/ TAH      Social History Elaine Luna  reports that she quit smoking about 45 years ago. Her smoking use included cigarettes. She started smoking about 55 years ago. She has a 3.00 pack-year smoking history. She has never used smokeless tobacco. She reports that she does not drink alcohol and does not use drugs.  family history includes Asthma in an other family member; Diabetes in her brother, maternal grandfather, and maternal grandmother; Heart  disease in her brother, father, and mother.  No Known Allergies     PHYSICAL EXAMINATION: Vital signs: BP 120/70   Pulse (!) 49   Ht 5\' 3"  (1.6 m)   Wt 147 lb (66.7 kg)   SpO2 97%   BMI 26.04 kg/m   Constitutional: generally well-appearing, no acute distress Psychiatric: alert and oriented x3, cooperative Eyes: Anicteric Mouth: Mask Abdomen: Not reexamined Skin: no lesions on visible extremities Neuro: Gross deficits  ASSESSMENT:  1.  GERD complicated by peptic stricture.  Asymptomatic post dilation on PPI 2.  History of adenomatous colon polyps 2007.  The patient declining surveillance colonoscopy for the time being.   PLAN:  1.  Reflux precautions 2.  Continue pantoprazole 40 mg daily.  Prescription refilled.  Medication risks reviewed 3.  Consider surveillance colonoscopy 4.  Routine office follow-up 1 year

## 2020-11-20 NOTE — Patient Instructions (Signed)
If you are age 77 or older, your body mass index should be between 23-30. Your Body mass index is 26.04 kg/m. If this is out of the aforementioned range listed, please consider follow up with your Primary Care Provider.  If you are age 43 or younger, your body mass index should be between 19-25. Your Body mass index is 26.04 kg/m. If this is out of the aformentioned range listed, please consider follow up with your Primary Care Provider.   ________________________________________________________  The [facility] providers would like to encourage you to use [facility] to communicate with providers for non-urgent requests or questions.  Due to long hold times on the telephone, sending your provider a message by [facility] may be a faster and more efficient way to get a response.  Please allow 48 business hours for a response.  Please remember that this is for non-urgent requests.  _______________________________________________________ We have sent the following medications to your pharmacy for you to pick up at your convenience: Protonix  Please follow up in 1 year.  Thank you for entrusting me with your care and for choosing [facility], Dr. Scarlette Shorts

## 2020-11-21 ENCOUNTER — Other Ambulatory Visit (HOSPITAL_BASED_OUTPATIENT_CLINIC_OR_DEPARTMENT_OTHER): Payer: Self-pay

## 2020-11-21 ENCOUNTER — Other Ambulatory Visit (HOSPITAL_COMMUNITY): Payer: Self-pay

## 2020-11-21 ENCOUNTER — Ambulatory Visit: Payer: Medicare Other | Attending: Internal Medicine

## 2020-11-21 DIAGNOSIS — Z23 Encounter for immunization: Secondary | ICD-10-CM

## 2020-11-21 MED ORDER — PFIZER COVID-19 VAC BIVALENT 30 MCG/0.3ML IM SUSP
INTRAMUSCULAR | 0 refills | Status: DC
  Filled 2020-11-21: qty 0.5, 1d supply, fill #0

## 2020-11-21 NOTE — Progress Notes (Signed)
   Covid-19 Vaccination Clinic  Name:  Elaine Luna    MRN: [id number] DOB: 24-Mar-1945  11/21/2020  [name] was observed post Covid-19 immunization for 15 minutes without incident. She was provided with Vaccine Information Sheet and instruction to access the V-Safe system.   [name] was instructed to call 911 with any severe reactions post vaccine: Difficulty breathing  Swelling of face and throat  A fast heartbeat  A bad rash all over body  Dizziness and weakness   Immunizations Administered     Name Date Dose VIS Date Route   Pfizer Covid-19 Vaccine Bivalent Booster 11/21/2020 11:37 AM 0.3 mL 10/02/2020 Intramuscular   Manufacturer: [place]   Lot: [place]: [facility]  Name:  Elaine Luna    MRN: [id number] DOB: 11/24/1945  11/21/2020  [name] was observed post Covid-19 immunization for 15 minutes without incident. She was provided with Vaccine Information Sheet and instruction to access the V-Safe system.   [name] was instructed to call 911 with any severe reactions post vaccine: Difficulty breathing  Swelling of face and throat  A fast heartbeat  A bad rash all over body  Dizziness and weakness   Immunizations Administered     Name Date Dose VIS Date Route   Pfizer Covid-19 Vaccine Bivalent Booster 11/21/2020 11:37 AM 0.3 mL 10/02/2020 Intramuscular   Manufacturer: [place]   Lot: [id number]   [place]: [phone]

## 2020-11-25 ENCOUNTER — Other Ambulatory Visit: Payer: Self-pay

## 2020-11-25 ENCOUNTER — Encounter: Payer: Self-pay | Admitting: Pulmonary Disease

## 2020-11-25 ENCOUNTER — Ambulatory Visit (INDEPENDENT_AMBULATORY_CARE_PROVIDER_SITE_OTHER): Payer: Medicare Other | Admitting: Pulmonary Disease

## 2020-11-25 VITALS — BP 130/76 | HR 71 | Temp 98.5°F | Ht 63.0 in | Wt 148.0 lb

## 2020-11-25 DIAGNOSIS — J4550 Severe persistent asthma, uncomplicated: Secondary | ICD-10-CM | POA: Diagnosis not present

## 2020-11-25 NOTE — Progress Notes (Signed)
Subjective:   PATIENT ID: Elaine Luna GENDER: female DOB: [birth year], MRN: [id number]   HPI  Chief Complaint  Patient presents with   Follow-up    Asthma has not flared up since she had covid 3 yrs and wants to know if she needs to continue all her asthma medication   Reason for Visit: Follow-up   Elaine Luna is a 75 year old female with severe persistent asthma, hx of COVID-19 10/2018, hx bilateral segmental pulmonary emboli s/p anticoagulation x 3 months who presents for follow-up.  11/25/20 She reports her asthma is well-controlled on Xolair and Qvair 1-2 times a day. Not on Dulera. Has not had an asthma exacerbation in 2 years. Denies cough, wheezing or shortness of breath. She is enrolled in [employer] and does two zumba classes a day five days a week.   Social Hx Her first son passed from suicide Her son passed away from [place] in April 27, 2020.  Past Medical History:  Diagnosis Date   Asthma    Colon polyps    DM type 2 (diabetes mellitus, type 2) ([place]) 04/27/13    Hyperlipidemia    Hypertension    Obesity    Pulmonary embolism associated with COVID-19 ([place]) 12/25/2018   Tubular adenoma     Outpatient Medications Prior to Visit  Medication Sig Dispense Refill   atorvastatin (LIPITOR) 20 MG tablet Take 1 tablet (20 mg total) by mouth daily. 90 tablet 3   beclomethasone (QVAR) 80 MCG/ACT inhaler INHALE 2 PUFFS BY MOUTH INTO THE LUNGS 2 (TWO) TIMES DAILY. 10.6 g 4   Denosumab (PROLIA [place]) Inject into the skin every 6 (six) months.     hydrochlorothiazide (HYDRODIURIL) 25 MG tablet TAKE 1 TABLET BY MOUTH ONCE A DAY AS NEEDED 90 tablet 3   irbesartan (AVAPRO) 300 MG tablet TAKE 1 TABLET BY MOUTH ONCE DAILY 90 tablet 2   montelukast (SINGULAIR) 10 MG tablet Take 1 tablet (10 mg total) by mouth daily. 90 tablet 0   omalizumab (XOLAIR) 150 MG/ML prefilled syringe INJECT 300 MG INTO THE SKIN EVERY 28 DAYS. 2 mL 12   pantoprazole (PROTONIX) 40 MG tablet Take 1 tablet  (40 mg total) by mouth daily. 90 tablet 3   Vitamin D, Ergocalciferol, (DRISDOL) 1.25 MG (50000 UNIT) CAPS capsule Take 1 capsule (50,000 Units total) by mouth 2 (two) times a week. 24 capsule 3   albuterol (PROVENTIL) (2.5 MG/3ML) 0.083% nebulizer solution USE 1 VIAL IN NEBULIZER EVERY 6 HOURS AS NEEDED FOR WHEEZING. (Patient not taking: Reported on 11/25/2020) 75 mL 1   albuterol (VENTOLIN HFA) 108 (90 Base) MCG/ACT inhaler INHALE 1-2 PUFFS BY MOUTH 4 TIMES DAILY AS NEEDED (Patient not taking: Reported on 11/25/2020) 54 g 0   COVID-19 mRNA bivalent vaccine, Pfizer, (PFIZER COVID-19 VAC BIVALENT) injection Inject into the muscle. (Patient not taking: Reported on 11/25/2020) 0.5 mL 0   influenza vaccine adjuvanted (FLUAD QUADRIVALENT) 0.5 ML injection Inject into the muscle. (Patient not taking: Reported on 11/25/2020) 0.5 mL 0   No facility-administered medications prior to visit.    Review of Systems  Constitutional:  Negative for chills, diaphoresis, fever, malaise/fatigue and weight loss.  HENT:  Negative for congestion.   Respiratory:  Negative for cough, hemoptysis, sputum production, shortness of breath and wheezing.   Cardiovascular:  Negative for chest pain, palpitations and leg swelling.    Objective:   Vitals:   11/25/20 1113  BP: 130/76  Pulse: 71  Temp: 98.5 F (36.9  C)  TempSrc: Oral  SpO2: 99%  Weight: 148 lb (67.1 kg)  Height: 5\' 3"  (1.6 m)   SpO2: 99 % O2 Device: None (Room air)  Physical Exam: General: Well-appearing, no acute distress HENT: [place], AT Eyes: EOMI, no scleral icterus Respiratory: Clear to auscultation bilaterally.  No crackles, wheezing or rales Cardiovascular: RRR, -M/R/G, no JVD Extremities:-Edema,-tenderness Neuro: AAO x4, CNII-XII grossly intact Psych: Normal mood, normal affect  Data Reviewed:  Imaging: HRCT 04/02/16 - air trapping present on expiration, mild cylindrical bronchiectasis and scattered sub-centimeter pulmonary nodules < 4 mm  that are unchanged  CTA 11/14/18 - several bilateral tiny pulmonary emboli in segmental branches of RUL, RLL, lingula and LLL. Stable bilateral pulmonary nodules  CXR 05/18/20 - hyperinflated  PFT: None on file    Assessment & Plan:   Discussion: 75 year old female with severe persistent asthma, hx aspergillus IgE antibody in 3212, [id number] complicated by pulmonary emboli in 2019and benign pulmonary nodules who presents for follow-up. Has been well-controlled for >2 years without exacerbations. We discussed the risks of discontinuing Xolair including worsening symptoms and exacerbations. After discussion patient agreed to discontinuing biologic with close follow-up.  Severe Persistent Asthma - well-controlled CONTINUE Qvar 80 mcg ONE puff TWICE a day DISCONTINUE Xolair DISCONTINUE Singulair due to concerns for anxiety/depression  Benign pulmonary nodules Stable since 2018. No further imaging warranted  Bradycardia, palpitations Followed by Cardiology with Dr. Audie Box  Health Maintenance Immunization History  Administered Date(s) Administered   Influenza Split 11/02/2012, 11/05/2014, 10/04/2015   Influenza Whole 10/13/2007, 11/03/2010, 11/06/2011   Influenza, High Dose Seasonal PF 11/02/2016, 12/05/2018   Influenza-Unspecified 11/02/2013, 11/21/2016   PFIZER Comirnaty(Gray Top)Covid-19 Tri-Sucrose Vaccine 05/30/2020   PFIZER(Purple Top)SARS-COV-2 Vaccination 01/20/2019, 02/10/2019   Pfizer Covid-19 Vaccine Bivalent Booster 39yrs & up 11/21/2020   Pneumococcal Conjugate-13 11/02/2012   Pneumococcal Polysaccharide-23 02/03/2008, 12/05/2018   Zoster Recombinat (Shingrix) 01/05/2019, 03/16/2019   No orders of the defined types were placed in this encounter.  No orders of the defined types were placed in this encounter.   Return in about 3 months (around 02/25/2021).  I have spent a total time of 35-minutes on the day of the appointment reviewing prior documentation, coordinating  care and discussing medical diagnosis and plan with the patient/family. Past medical history, allergies, medications were reviewed. Pertinent imaging, labs and tests included in this note have been reviewed and interpreted independently by me.   [place], MD [facility] 11/25/2020 11:52 AM  Office Number [phone]

## 2020-11-25 NOTE — Patient Instructions (Addendum)
Severe Persistent Asthma - well-controlled CONTINUE Qvar 80 mcg ONE puff TWICE a day DISCONTINUE Xolair DISCONTINUE Singulair  Follow-up with me in 3 months

## 2020-11-25 NOTE — Progress Notes (Addendum)
[facility] (dispensing pharmacy for patient's Xolair) notified via email to discontinue future fills. Rx for Xolair and montelukast discontinued today from patient's chart.  Knox Saliva, PharmD, MPH, BCPS Clinical Pharmacist (Rheumatology and Pulmonology)

## 2020-11-25 NOTE — Addendum Note (Signed)
Addended by: Cassandria Anger on: 11/25/2020 03:38 PM   Modules accepted: Orders

## 2020-11-26 ENCOUNTER — Other Ambulatory Visit (HOSPITAL_COMMUNITY): Payer: Self-pay

## 2020-12-09 ENCOUNTER — Other Ambulatory Visit (HOSPITAL_BASED_OUTPATIENT_CLINIC_OR_DEPARTMENT_OTHER): Payer: Self-pay

## 2020-12-09 MED FILL — Irbesartan Tab 300 MG: ORAL | 90 days supply | Qty: 90 | Fill #1 | Status: AC

## 2020-12-11 ENCOUNTER — Ambulatory Visit (INDEPENDENT_AMBULATORY_CARE_PROVIDER_SITE_OTHER): Payer: Medicare Other | Admitting: Psychology

## 2020-12-11 DIAGNOSIS — F4323 Adjustment disorder with mixed anxiety and depressed mood: Secondary | ICD-10-CM | POA: Diagnosis not present

## 2020-12-18 ENCOUNTER — Other Ambulatory Visit (HOSPITAL_BASED_OUTPATIENT_CLINIC_OR_DEPARTMENT_OTHER): Payer: Self-pay

## 2020-12-19 ENCOUNTER — Other Ambulatory Visit (HOSPITAL_BASED_OUTPATIENT_CLINIC_OR_DEPARTMENT_OTHER): Payer: Self-pay

## 2020-12-19 MED ORDER — ATORVASTATIN CALCIUM 20 MG PO TABS
20.0000 mg | ORAL_TABLET | Freq: Every day | ORAL | 3 refills | Status: DC
  Filled 2020-12-19: qty 90, 90d supply, fill #0
  Filled 2021-05-20: qty 90, 90d supply, fill #1

## 2020-12-23 ENCOUNTER — Ambulatory Visit: Payer: Medicare Other | Admitting: Psychology

## 2021-01-06 ENCOUNTER — Ambulatory Visit: Payer: Medicare Other | Admitting: Psychology

## 2021-01-10 ENCOUNTER — Ambulatory Visit: Payer: Medicare Other | Admitting: Psychology

## 2021-01-13 ENCOUNTER — Ambulatory Visit (INDEPENDENT_AMBULATORY_CARE_PROVIDER_SITE_OTHER): Payer: Medicare Other | Admitting: Psychology

## 2021-01-13 DIAGNOSIS — F4323 Adjustment disorder with mixed anxiety and depressed mood: Secondary | ICD-10-CM

## 2021-01-13 DIAGNOSIS — F411 Generalized anxiety disorder: Secondary | ICD-10-CM

## 2021-01-13 NOTE — Progress Notes (Signed)
  Date: 01/13/2021  Diagnosis unspecified Symptoms unspecified Medication Status compliance  Safety none  If Suicidal or Homicidal State Action Taken: unspecified  Current Risk: low Medications unspecified Objectives unspecified Client Response full compliance  Service Location Location, [address] [place], [place] [address]  Service Code cpt [id number]  Normalize/Reframe  Facilitate problem solving  Validate/empathize  Distress tolerance skill  Emotion regulation skills  Self care activities  Self-monitoring  Mindfulness training  Comments  Dx: Adjustment Disorder with Anxiety and Depression  Meds: [place] for asthma, zolair (injetion), blood pressure Meds. No psychotropic.  Goals: Patient is looking to reduce symptoms of anxiety and depression. Goal date is 5-23.  Patient agrees to a video (Webex) session due to the pandemic. She is at home and I am in my home office. She was in [place] for Thanksgiving to see kids/grandkids and it was a good visit. She stayed with daughter in law (who was married to her son who suicided). She unfortunately got a terrible cold from them.  She says she still worries about everything. She is "even apprehensive about what comes in the mail". She says it is like "living under a black cloud" and something bad might happen. She grew up poor and raised by a single mother who had little financial resources. Mother was shy and very nervous. Parents separated when Rami was 1. He was an alcoholic and died when she was 30. She only met him once before he died. This was first of many losses. Grew up very vulnerable and witnessed mother chronic anxiety.  Married husband because she felt he could protect her and keep her safe. She says he was the father needed her whole life. He was 47 years older than her.  She did start her gratitude list and will continue to work on it. She says she has "so much to be grateful for". This is the first  Christmas that she does not have any plans other than being with her son who lives here. She feels under the weather and is not going to decorate her house.  We talked about those things for which she is most grateful. Her grandson got engaged and she is taking 2 granddaughters to [place] this summer.    Elaine Morel, PhD

## 2021-01-17 ENCOUNTER — Other Ambulatory Visit (HOSPITAL_BASED_OUTPATIENT_CLINIC_OR_DEPARTMENT_OTHER): Payer: Self-pay

## 2021-01-17 ENCOUNTER — Other Ambulatory Visit: Payer: Self-pay | Admitting: Adult Health

## 2021-01-17 MED ORDER — ALBUTEROL SULFATE HFA 108 (90 BASE) MCG/ACT IN AERS
INHALATION_SPRAY | RESPIRATORY_TRACT | 0 refills | Status: DC
  Filled 2021-01-17: qty 54, 51d supply, fill #0

## 2021-01-20 ENCOUNTER — Other Ambulatory Visit (HOSPITAL_BASED_OUTPATIENT_CLINIC_OR_DEPARTMENT_OTHER): Payer: Self-pay

## 2021-01-30 ENCOUNTER — Ambulatory Visit (INDEPENDENT_AMBULATORY_CARE_PROVIDER_SITE_OTHER): Payer: Medicare Other | Admitting: Psychology

## 2021-01-30 DIAGNOSIS — F411 Generalized anxiety disorder: Secondary | ICD-10-CM | POA: Diagnosis not present

## 2021-01-30 NOTE — Progress Notes (Signed)
°  Date: 01/30/2021  Diagnosis Adj. Disorder with anxiety and depression Symptoms unspecified Medication Status compliance  Safety none  If Suicidal or Homicidal State Action Taken: unspecified  Current Risk: low Medications unspecified Objectives unspecified Client Response full compliance  Service Location Location, [address] [place], [place] [address]  Service Code cpt [id number]  Normalize/Reframe  Facilitate problem solving  Validate/empathize  Distress tolerance skill  Emotion regulation skills  Self care activities  Self-monitoring  Mindfulness training  Comments  Dx: Adjustment Disorder with Anxiety and Depression  Meds: [place] for asthma, zolair (injection), blood pressure Meds. No psychotropic.  Goals: Patient is looking to reduce symptoms of anxiety and depression. Goal date is 5-23.  Patient agrees to a video (Webex) session due to the pandemic. She is at home and I am in my home office. Elaine Luna says that Christmas was okay and that she went to church with her family on Xmas eve and then had family over on Xmas day. She is going out with friends on New Year's eve. She says that she realizes now that she has to have plans and needs things to look forward to. She says she still feels sad because she is having to work to stay busy.  She reports that the mornings are the most difficult for her in terms of sadness. It does not last long, but she struggles with getting out of bed. This is when she thinks about her own mortality. She wonders about "how it will happen". She is thinking it may be related to her retirement this past July. She does not find retirement as wonderful as many people. Her husband retired at 3. She says that she thinks about afterlife and wonders if she will be "reunited" with her sons and husband. She has 2 remaining children and she does not want to have to rely on either of them in her latter years. Her daughter wants her to live with  them in [place] while her son wants her to go to [employer] retirement facility. We discussed factors to consider when making a decision about where to live in her latter years. Elaine Luna is staying active, but has recently [facility] [employer]. She plans to start back tomorrow. She says that she feels she is coming out of a slump.    Elaine Morel, PhD  Time:8:35-9:25a 50 min.

## 2021-02-06 ENCOUNTER — Ambulatory Visit: Payer: Medicare Other | Admitting: Psychology

## 2021-02-07 ENCOUNTER — Other Ambulatory Visit (HOSPITAL_BASED_OUTPATIENT_CLINIC_OR_DEPARTMENT_OTHER): Payer: Self-pay

## 2021-02-07 ENCOUNTER — Encounter: Payer: Self-pay | Admitting: Pulmonary Disease

## 2021-02-07 ENCOUNTER — Other Ambulatory Visit: Payer: Self-pay

## 2021-02-07 ENCOUNTER — Ambulatory Visit (INDEPENDENT_AMBULATORY_CARE_PROVIDER_SITE_OTHER): Payer: Medicare Other | Admitting: Pulmonary Disease

## 2021-02-07 VITALS — BP 140/78 | HR 58 | Temp 98.1°F | Ht 63.0 in | Wt 144.4 lb

## 2021-02-07 DIAGNOSIS — J4550 Severe persistent asthma, uncomplicated: Secondary | ICD-10-CM

## 2021-02-07 MED ORDER — BECLOMETHASONE DIPROP HFA 80 MCG/ACT IN AERB
INHALATION_SPRAY | RESPIRATORY_TRACT | 5 refills | Status: DC
  Filled 2021-02-07 (×2): qty 10.6, 30d supply, fill #0

## 2021-02-07 MED ORDER — FLUTICASONE PROPIONATE HFA 110 MCG/ACT IN AERO
2.0000 | INHALATION_SPRAY | Freq: Two times a day (BID) | RESPIRATORY_TRACT | 5 refills | Status: DC
  Filled 2021-02-07: qty 12, 30d supply, fill #0

## 2021-02-07 MED ORDER — FLUTICASONE PROPIONATE HFA 110 MCG/ACT IN AERO
1.0000 | INHALATION_SPRAY | Freq: Two times a day (BID) | RESPIRATORY_TRACT | 12 refills | Status: DC
  Filled 2021-02-07: qty 12, 60d supply, fill #0

## 2021-02-07 NOTE — Patient Instructions (Addendum)
Severe Persistent Asthma - well-controlled REFILL Qvar 80 mcg ONE puff TWICE a day ADDENDUM: Changed to Flovent 110 mcg ONE puffs TWICE a day  Follow-up with me in 6 months

## 2021-02-07 NOTE — Progress Notes (Signed)
Subjective:   PATIENT ID: Elaine Luna GENDER: female DOB: [birth year], MRN: [id number]   HPI  Chief Complaint  Patient presents with   Follow-up    Asthma   Reason for Visit: Follow-up   [name] is a 76 year old female with severe persistent asthma, hx of COVID-19 10/2018, hx bilateral segmental pulmonary emboli s/p anticoagulation x 3 months who presents for follow-up.  11/25/20 She reports her asthma is well-controlled on Xolair and Qvair 1-2 times a day. Not on Dulera. Has not had an asthma exacerbation in 2 years. Denies cough, wheezing or shortness of breath. She is enrolled in [employer] and does two zumba classes a day five days a week.   02/07/21 Since discontinuing Xolair she reports her symptoms are well controlled. Qvar one puff twice day. Not needing albuterol inhaler. She is active with [employer] and going to zumba daily. Over the holiday she had a flu like illness with sore throat but no respiratory symptoms.  Social Hx Her first son passed from suicide. Her second son recently passed away from [place] in Apr 25, 2020.  Past Medical History:  Diagnosis Date   Asthma    Colon polyps    DM type 2 (diabetes mellitus, type 2) ([place]) 04/25/13    Hyperlipidemia    Hypertension    Obesity    Pulmonary embolism associated with COVID-19 ([place]) 12/25/2018   Tubular adenoma     Outpatient Medications Prior to Visit  Medication Sig Dispense Refill   atorvastatin (LIPITOR) 20 MG tablet Take 1 tablet (20 mg total) by mouth daily. 90 tablet 3   beclomethasone (QVAR) 80 MCG/ACT inhaler INHALE 2 PUFFS BY MOUTH INTO THE LUNGS 2 (TWO) TIMES DAILY. 10.6 g 4   Denosumab (PROLIA [place]) Inject into the skin every 6 (six) months.     hydrochlorothiazide (HYDRODIURIL) 25 MG tablet TAKE 1 TABLET BY MOUTH ONCE A DAY AS NEEDED 90 tablet 3   irbesartan (AVAPRO) 300 MG tablet TAKE 1 TABLET BY MOUTH ONCE DAILY 90 tablet 2   pantoprazole (PROTONIX) 40 MG tablet Take 1 tablet (40  mg total) by mouth daily. 90 tablet 3   Vitamin D, Ergocalciferol, (DRISDOL) 1.25 MG (50000 UNIT) CAPS capsule Take 1 capsule (50,000 Units total) by mouth 2 (two) times a week. 24 capsule 3   albuterol (PROVENTIL) (2.5 MG/3ML) 0.083% nebulizer solution USE 1 VIAL IN NEBULIZER EVERY 6 HOURS AS NEEDED FOR WHEEZING. (Patient not taking: Reported on 11/25/2020) 75 mL 1   albuterol (VENTOLIN HFA) 108 (90 Base) MCG/ACT inhaler INHALE 1-2 PUFFS BY MOUTH 4 TIMES DAILY AS NEEDED (Patient not taking: Reported on 02/07/2021) 54 g 0   COVID-19 mRNA bivalent vaccine, Pfizer, (PFIZER COVID-19 VAC BIVALENT) injection Inject into the muscle. (Patient not taking: Reported on 11/25/2020) 0.5 mL 0   influenza vaccine adjuvanted (FLUAD QUADRIVALENT) 0.5 ML injection Inject into the muscle. (Patient not taking: Reported on 11/25/2020) 0.5 mL 0   No facility-administered medications prior to visit.    Review of Systems  Constitutional:  Negative for chills, diaphoresis, fever, malaise/fatigue and weight loss.  HENT:  Negative for congestion.   Respiratory:  Negative for cough, hemoptysis, sputum production, shortness of breath and wheezing.   Cardiovascular:  Negative for chest pain, palpitations and leg swelling.    Objective:   Vitals:   02/07/21 1040  BP: 140/78  Pulse: (!) 58  Temp: 98.1 F (36.7 C)  TempSrc: Oral  SpO2: 98%  Weight: 144 lb  6.4 oz (65.5 kg)  Height: 5\' 3"  (1.6 m)   SpO2: 98 % O2 Device: None (Room air)  Physical Exam: General: Well-appearing, no acute distress HENT: [place], AT Eyes: EOMI, no scleral icterus Respiratory: Clear to auscultation bilaterally.  No crackles, wheezing or rales Cardiovascular: RRR, -M/R/G, no JVD Extremities:-Edema,-tenderness Neuro: AAO x4, CNII-XII grossly intact Psych: Normal mood, normal affect  Data Reviewed:  Imaging: HRCT 04/02/16 - air trapping present on expiration, mild cylindrical bronchiectasis and scattered sub-centimeter pulmonary nodules <  4 mm that are unchanged  CTA 11/14/18 - several bilateral tiny pulmonary emboli in segmental branches of RUL, RLL, lingula and LLL. Stable bilateral pulmonary nodules  CXR 05/18/20 - hyperinflated  PFT: None on file    Assessment & Plan:   Discussion: 76 year old female female with severe persistent asthma, hx aspergillus IgE antibody in 2017, hx PE in setting of COVID-19 in 2019 s/p anticoagulation and hx benign pulmonary nodules who presents for follow-up. Discussed clinical course of asthma including bronchodilator management and asthma action plan. Has been off Xolair for 3 months with no recurrent symptoms. If she does start having issues, would plan to step up ICS strength before considering re-initiation of biologic.  Severe Persistent Asthma - well-controlled No exacerbations in >2 years Xolair discontinued 11/2020 with no recurrent symptoms --REFILL Qvar 80 mcg ONE puff TWICE a day --DISCONTINUED Singulair due to concerns for anxiety/depression  Benign pulmonary nodules Stable since 2018. No further imaging warranted  Bradycardia, palpitations - no active symptoms or concerns Followed by Cardiology with Dr. Audie Box  Health Maintenance Immunization History  Administered Date(s) Administered   Influenza Split 11/02/2012, 11/05/2014, 10/04/2015   Influenza Whole 10/13/2007, 11/03/2010, 11/06/2011   Influenza, High Dose Seasonal PF 11/02/2016, 12/05/2018   Influenza-Unspecified 11/02/2013, 11/21/2016   PFIZER Comirnaty(Gray Top)Covid-19 Tri-Sucrose Vaccine 05/30/2020   PFIZER(Purple Top)SARS-COV-2 Vaccination 01/20/2019, 02/10/2019   Pfizer Covid-19 Vaccine Bivalent Booster 54yrs & up 11/21/2020   Pneumococcal Conjugate-13 11/02/2012   Pneumococcal Polysaccharide-23 02/03/2008, 12/05/2018   Zoster Recombinat (Shingrix) 01/05/2019, 03/16/2019   No orders of the defined types were placed in this encounter.  Meds ordered this encounter  Medications   DISCONTD:  beclomethasone (QVAR) 80 MCG/ACT inhaler    Sig: INHALE 2 PUFFS BY MOUTH INTO THE LUNGS 2 (TWO) TIMES DAILY.    Dispense:  10.6 g    Refill:  5   DISCONTD: fluticasone (FLOVENT HFA) 110 MCG/ACT inhaler    Sig: Inhale 2 puffs into the lungs in the morning and at bedtime.    Dispense:  12 g    Refill:  5   fluticasone (FLOVENT HFA) 110 MCG/ACT inhaler    Sig: Inhale 1 puff into the lungs in the morning and at bedtime.    Dispense:  12 g    Refill:  12    Return in about 6 months (around 08/07/2021).  I have spent a total time of 25-minutes on the day of the appointment reviewing prior documentation, coordinating care and discussing medical diagnosis and plan with the patient/family. Past medical history, allergies, medications were reviewed. Pertinent imaging, labs and tests included in this note have been reviewed and interpreted independently by me.   [place], MD [facility] 02/07/2021 10:51 AM  Office Number [phone]

## 2021-02-13 ENCOUNTER — Encounter: Payer: Self-pay | Admitting: Pulmonary Disease

## 2021-02-17 ENCOUNTER — Ambulatory Visit: Payer: Medicare Other | Admitting: Pulmonary Disease

## 2021-02-21 ENCOUNTER — Ambulatory Visit (INDEPENDENT_AMBULATORY_CARE_PROVIDER_SITE_OTHER): Payer: Medicare Other | Admitting: Psychology

## 2021-02-21 DIAGNOSIS — F4323 Adjustment disorder with mixed anxiety and depressed mood: Secondary | ICD-10-CM

## 2021-02-21 NOTE — Progress Notes (Signed)
°  Date: 02/21/2021  Diagnosis Adj. Disorder with anxiety and depression Symptoms unspecified Medication Status compliance  Safety none  If Suicidal or Homicidal State Action Taken: unspecified  Current Risk: low Medications unspecified Objectives unspecified Client Response full compliance  Service Location Location, [address] [place],  [address]  Service Code cpt [phone]  Normalize/Reframe  Facilitate problem solving  Validate/empathize  Distress tolerance skill  Emotion regulation skills  Self care activities  Self-monitoring  Mindfulness training  Session Notes:  Dx: Adjustment Disorder with Anxiety and Depression  Meds: [place] for asthma, zolair (injection), blood pressure Meds. No psychotropic.  Goals: Patient is looking to reduce symptoms of anxiety and depression. Goal date is 5-23.  Patient agrees to a video (Webex) session due to the pandemic. She is at home and I am in my home office. Elaine Luna says she feels like she always has a black cloud over her. She feels that since retirement, she is focused on her own mortality and wonders how/when she will die. She thinks it may be related to not having a purpose. She saw an old note from her son that committed suicide 6 years ago. She also says that she does not have the same feelings for her other son that died of Covid 70 a few years ago. The latter son she had at 82 and gave him up for adoption, and they did not have a relationship until [place] found him when she was in her 80's. It was when her mother died and she said "it was not until then that I could look him up". She was sent to a school for unwed mothers during the pregnancy. She does not regret finding her son. She does have a great relationship with her grandchildren from this son Elaine Luna) and the great grand children. She derives some comfort in the thought that she will one day reunite with Elaine Luna. Older son Elaine Luna was 7 1/2 when Elaine Luna was  born. Elaine Luna was angry when [place] died. He went to a wedding and a fishing trip in the days following Elaine Luna's death.  Elaine Luna has some upcoming travel and that is helpful to focus on. She is also interested in getting involved with volunteer activities and social groups. She does enjoy writing and we discussed the value of journaling. She agrees and will start this week.           Elaine Morel, PhD  Time:8:40-9:30a 50 min.

## 2021-03-04 ENCOUNTER — Other Ambulatory Visit (HOSPITAL_BASED_OUTPATIENT_CLINIC_OR_DEPARTMENT_OTHER): Payer: Self-pay

## 2021-03-05 ENCOUNTER — Ambulatory Visit (INDEPENDENT_AMBULATORY_CARE_PROVIDER_SITE_OTHER): Payer: Medicare Other | Admitting: Psychology

## 2021-03-05 DIAGNOSIS — F4323 Adjustment disorder with mixed anxiety and depressed mood: Secondary | ICD-10-CM | POA: Diagnosis not present

## 2021-03-05 NOTE — Progress Notes (Signed)
°  Date: 03/05/2021  Diagnosis Adj. Disorder with anxiety and depression Symptoms unspecified Medication Status compliance  Safety none  If Suicidal or Homicidal State Action Taken: unspecified  Current Risk: low Medications unspecified Objectives unspecified Client Response full compliance  Service Location Location, [address] [place], [place] [address]  Service Code cpt [phone]  Normalize/Reframe  Facilitate problem solving  Validate/empathize  Distress tolerance skill  Emotion regulation skills  Self care activities  Self-monitoring  Mindfulness training  Session Notes:  Dx: Adjustment Disorder with Anxiety and Depression  Meds: [place] for asthma, zolair (injection), blood pressure Meds. No psychotropic.  Goals: Patient is looking to reduce symptoms of anxiety and depression. Goal date is 5-23.  Patient agrees to a video (Webex) session due to the pandemic. She is at home and I am in my home office. Lattie says she does not like retirement and is more miserable than she has ever been. She hates "not having a plan". She says that she is going to go to [place] to meet her great grandchildren. These are the grandchildren of the son he gave up for adoption at age 65. She is also going to travel to see friends in [place] at the end of February.  She talked about her first marriage which lasted 3 years and they had one son. That son was adopted by her second husband. FOO: Her father left home when Mauri was one. She was told that her father came home from the [place] "a nasty drunk" and her mother left him when he was aggressive to [place]. She ended up being raised by her grandmother.  Jaycee and her husband adopted a girl from age 58-16. She was a classmate with her son in elementary school. She ended up running away to find her biological mother. She became a ward of the state and severed contact. Aithana contacted her when her husband died and left her  some money. She didn't want to see Duyen, so she just sent her the money. She says that people come and go in her family.  She talked about the love of her career and her grief of not working. 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 She loved the excitement and intensity. 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 Got masters at 66 and doctorate at 87. We discussed the challenge of going from her high intensity job to the "mundane" life of being retired. We talked about her volunteering in the ER. She will call and find out the opportunities that are available.             [name], PhD  Time:2:10p-3:00a 50 min.                                  [name], PhD

## 2021-03-07 ENCOUNTER — Ambulatory Visit: Payer: Medicare Other | Admitting: Psychology

## 2021-03-19 ENCOUNTER — Other Ambulatory Visit (HOSPITAL_COMMUNITY): Payer: Self-pay

## 2021-03-20 ENCOUNTER — Ambulatory Visit (HOSPITAL_COMMUNITY)
Admission: RE | Admit: 2021-03-20 | Discharge: 2021-03-20 | Disposition: A | Discharge status: Home / Self Care | Payer: Medicare Other | Source: Ambulatory Visit | Attending: Internal Medicine | Admitting: Internal Medicine

## 2021-03-20 ENCOUNTER — Other Ambulatory Visit: Payer: Self-pay

## 2021-03-20 DIAGNOSIS — M810 Age-related osteoporosis without current pathological fracture: Secondary | ICD-10-CM | POA: Insufficient documentation

## 2021-03-20 MED ORDER — DENOSUMAB 60 MG/ML ~~LOC~~ SOSY
PREFILLED_SYRINGE | SUBCUTANEOUS | Status: AC
  Filled 2021-03-20: qty 1

## 2021-03-20 MED ORDER — DENOSUMAB 60 MG/ML ~~LOC~~ SOSY
60.0000 mg | PREFILLED_SYRINGE | Freq: Once | SUBCUTANEOUS | Status: AC
  Administered 2021-03-20: 60 mg via SUBCUTANEOUS

## 2021-04-07 ENCOUNTER — Ambulatory Visit (INDEPENDENT_AMBULATORY_CARE_PROVIDER_SITE_OTHER): Payer: Medicare Other | Admitting: Psychology

## 2021-04-07 DIAGNOSIS — F4323 Adjustment disorder with mixed anxiety and depressed mood: Secondary | ICD-10-CM | POA: Diagnosis not present

## 2021-04-07 NOTE — Progress Notes (Signed)
Date: 04/07/2021  ?Diagnosis ?Adj. Disorder with anxiety and depression ?Symptoms ?unspecified ?Medication Status ?compliance  ?Safety ?none  ?If Suicidal or Homicidal State Action Taken: unspecified  ?Current Risk: low ?Medications ?unspecified ?Objectives ?unspecified ?Client Response ?full compliance  ?Service Location ?Location, [address] [place], [place] [address]  ?Service Code ?cpt [id number]  ?Normalize/Reframe  ?Facilitate problem solving  ?Validate/empathize  ?Distress tolerance skill  ?Emotion regulation skills  ?Self care activities  ?Self-monitoring  ?Mindfulness training  ?Session Notes:  ?Dx: Adjustment Disorder with Anxiety and Depression  ?Meds: [place] for asthma, zolair (injection), blood pressure Meds. No psychotropic.  ?Goals: Patient is looking to reduce symptoms of anxiety and depression. Goal date is 5-23.  ?Patient agrees to a video (Webex) session due to the pandemic. She is at home and I am in my home office. ?[name] was away for 10 days with friends that rented a place in [place]. She was thrilled that she felt so good and thought that the dark cloud was gone. As soon as she got home, she felt bad. Is having bad dreams about "small things". She will be leaving to go to the beach with another friend this coming weekend and feels good about it. She also has signed up for a gardening class. She has a need to keep busy and be productive. 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 Found this depressing. Her "good news" is that her grandson is getting married in July. She has things to look forward to. We talked about making a gratitude list and having it accessible at all times.     ?        ? ?[name], PhD  Time:1:10p-2:00a 50 min. ? ?  ? ? ? ? ? ? ? ? ? ? ? ? ? ? ? ? ? ? ? ? ? ? ? ? ? ? ? ? ? ? ? ?

## 2021-04-22 ENCOUNTER — Ambulatory Visit (INDEPENDENT_AMBULATORY_CARE_PROVIDER_SITE_OTHER): Payer: Medicare Other | Admitting: Psychology

## 2021-04-22 DIAGNOSIS — F4323 Adjustment disorder with mixed anxiety and depressed mood: Secondary | ICD-10-CM

## 2021-04-22 NOTE — Progress Notes (Addendum)
Date: 04/22/2021  ?Diagnosis ?Adj. Disorder with anxiety and depression ?Symptoms ?unspecified ?Medication Status ?compliance  ?Safety ?none  ?If Suicidal or Homicidal State Action Taken: unspecified  ?Current Risk: low ?Medications ?unspecified ?Objectives ?unspecified ?Client Response ?full compliance  ?Service Location ?Location, [address] [place], [place] [address]  ?Service Code ?cpt [id number]  ?Normalize/Reframe  ?Facilitate problem solving  ?Validate/empathize  ?Distress tolerance skill  ?Emotion regulation skills  ?Self care activities  ?Self-monitoring  ?Mindfulness training  ?Session Notes:  ?Dx: Adjustment Disorder with Anxiety and Depression  ?Meds: [place] for asthma, zolair (injection), blood pressure Meds. No psychotropic.  ?Goals: Patient is looking to reduce symptoms of anxiety and depression. Goal date is 5-23.  ?Patient agrees to a video (Webex) session due to the pandemic. She is at home and I am in my home office. ?Elaine Luna went out with a friend to her church. She is excited that she has made some plans. She will be going to [place] to meet her great grandchildren. She is taking granddaughters to [place] in July and taking her grandson to [place] at the end of October. These are all trips that get her excited and motivated to take care of herself and be optimistic for the future. We talked about her need to excitement and activity. She still feels in retirement transition and is "not yet settled". Very upbeat and positive during session.   ? ?Elaine Morel, PhD Time: 4:10p-5:00p 50 minutes ?

## 2021-04-23 ENCOUNTER — Other Ambulatory Visit: Payer: Self-pay

## 2021-04-24 ENCOUNTER — Other Ambulatory Visit (HOSPITAL_BASED_OUTPATIENT_CLINIC_OR_DEPARTMENT_OTHER): Payer: Self-pay

## 2021-04-25 ENCOUNTER — Other Ambulatory Visit (HOSPITAL_BASED_OUTPATIENT_CLINIC_OR_DEPARTMENT_OTHER): Payer: Self-pay

## 2021-04-25 MED ORDER — IRBESARTAN 300 MG PO TABS
300.0000 mg | ORAL_TABLET | Freq: Every day | ORAL | 2 refills | Status: DC
  Filled 2021-04-25: qty 90, 90d supply, fill #0

## 2021-05-05 ENCOUNTER — Ambulatory Visit (INDEPENDENT_AMBULATORY_CARE_PROVIDER_SITE_OTHER): Payer: Medicare Other | Admitting: Psychology

## 2021-05-05 DIAGNOSIS — F4323 Adjustment disorder with mixed anxiety and depressed mood: Secondary | ICD-10-CM | POA: Diagnosis not present

## 2021-05-05 NOTE — Progress Notes (Signed)
? ? ?  Date: 05/05/2021  ?Diagnosis ?Adj. Disorder with anxiety and depression ?Symptoms ?unspecified ?Medication Status ?compliance  ?Safety ?none  ?If Suicidal or Homicidal State Action Taken: unspecified  ?Current Risk: low ?Medications ?unspecified ?Objectives ?unspecified ?Client Response ?full compliance  ?Service Location ?Location, [address] [place], [place] [address]  ?Service Code ?cpt [id number]  ?Normalize/Reframe  ?Facilitate problem solving  ?Validate/empathize  ?Distress tolerance skill  ?Emotion regulation skills  ?Self care activities  ?Self-monitoring  ?Mindfulness training  ?Session Notes:  ?Dx: Adjustment Disorder with Anxiety and Depression  ?Meds: [place] for asthma, zolair (injection), blood pressure Meds. No psychotropic.  ?Goals: Patient is looking to reduce symptoms of anxiety and depression. Goal date is 5-23.  ?Patient agrees to a video (Webex) session due to the pandemic. She is at home and I am in my home office. ?[name] will be going to [place] for her birthday the 6th-15th. She states that she gets "anxious over nothing". Will then go to [place] at end of month to met great grand children she has never met. She is very frustrated. She does not see much of the progress she has made. She is involved with conferences, church, exercise, teaching and travel. She is socializing and initiating social interactions. She misses the people that she worked with in the past. We talked about finding some volunteer activity that fills many of her needs to give back and involves lots of interpersonal interactions. She will research options and report back at next session.  ?Elaine Morel, PhD 1:10-2:00p 50 minutes  ? ? ? ? ? ? ? ? ? ? ? ? ? ? ? ? ? ? ? ? ? ? ? ? ? ? ? ? ? ? ? ? ? ? ? ? ? ? ? ? ? ? ? ? ?

## 2021-05-19 ENCOUNTER — Ambulatory Visit: Payer: Medicare Other | Admitting: Psychology

## 2021-05-20 ENCOUNTER — Other Ambulatory Visit (HOSPITAL_BASED_OUTPATIENT_CLINIC_OR_DEPARTMENT_OTHER): Payer: Self-pay

## 2021-06-02 ENCOUNTER — Ambulatory Visit: Payer: Medicare Other | Admitting: Psychology

## 2021-06-09 ENCOUNTER — Other Ambulatory Visit (HOSPITAL_BASED_OUTPATIENT_CLINIC_OR_DEPARTMENT_OTHER): Payer: Self-pay

## 2021-06-09 ENCOUNTER — Ambulatory Visit: Payer: Medicare Other | Attending: Internal Medicine

## 2021-06-09 DIAGNOSIS — Z23 Encounter for immunization: Secondary | ICD-10-CM

## 2021-06-09 MED ORDER — PFIZER COVID-19 VAC BIVALENT 30 MCG/0.3ML IM SUSP
INTRAMUSCULAR | 0 refills | Status: DC
  Filled 2021-06-09: qty 0.3, 1d supply, fill #0

## 2021-06-09 NOTE — Progress Notes (Signed)
? ?  Covid-[facility] Vaccination [facility] ? ?Name:  Elaine Luna    ?MRN: [id number] ?DOB: December 25, 1945 ? ?06/09/2021 ? ?[name] was observed post Covid-19 immunization for 15 minutes without incident. She was provided with Vaccine Information Sheet and instruction to access the V-Safe system.  ? ?[name] was instructed to call 911 with any severe reactions post vaccine: ?Difficulty breathing  ?Swelling of face and throat  ?A fast heartbeat  ?A bad rash all over body  ?Dizziness and weakness  ? ?Immunizations Administered   ? ? Name Date Dose VIS Date Route  ? Ambulance person Booster 06/09/2021  1:09 PM 0.3 mL 10/02/2020 Intramuscular  ? Manufacturer: [place]: [id number]  ? Mutual: [phone]  ? ?  ? ? ?

## 2021-06-16 ENCOUNTER — Ambulatory Visit (INDEPENDENT_AMBULATORY_CARE_PROVIDER_SITE_OTHER): Payer: Medicare Other | Admitting: Psychology

## 2021-06-16 DIAGNOSIS — F4323 Adjustment disorder with mixed anxiety and depressed mood: Secondary | ICD-10-CM

## 2021-06-16 NOTE — Progress Notes (Signed)
? ? ? ? ? ? ? ? ? ?  Date: 06/16/2021  ?Diagnosis ?Adj. Disorder with anxiety and depression ?Symptoms ?unspecified ?Medication Status ?compliance  ?Safety ?none  ?If Suicidal or Homicidal State Action Taken: unspecified  ?Current Risk: low ?Medications ?unspecified ?Objectives ?unspecified ?Client Response ?full compliance  ?Service Location ?Location, [address] [place], [place] [address]  ?Service Code ?cpt [id number]  ?Normalize/Reframe  ?Facilitate problem solving  ?Validate/empathize  ?Distress tolerance skill  ?Emotion regulation skills  ?Self care activities  ?Self-monitoring  ?Mindfulness training  ?Session Notes:  ?Dx: Adjustment Disorder with Anxiety and Depression  ?Meds: [place] for asthma, zolair (injection), blood pressure Meds. No psychotropic.  ?Goals: Patient is looking to reduce symptoms of anxiety and depression. Goal date is 5-23.  ?Patient agrees to a video (Webex) session due to the pandemic. She is at home and I am in my home office. ?[name] says that for Elaine Luna she went to her daughter's and had a good time. The weekend before to be with the family of her deceased son that she gave away for adoption. She says it was wonderful beyond her expectations. In spite of doing well in some regards, she still wakes up with anxiety and has some depression. Her surviving son is the one person she has the most difficulty with since the time he was a small child. He is "very private and not warm". She says all of his kids are very successful and wonderful people.  ?In August she is going to [place] with her church. She will be involved in nursing and church activities. Also, has submitted application to volunteer at [facility] and [facility].        ? ?  ?Elaine Morel, PhD 1:10-2:00p 50 minutes  ? ? ? ? ? ? ? ? ? ? ? ? ? ? ? ? ? ? ? ? ? ? ? ? ? ? ? ? ? ? ? ? ? ? ? ? ? ? ? ? ? ? ? ? ?

## 2021-06-22 IMAGING — CR DG HAND COMPLETE 3+V*L*
3 series · 3 of 3 positions shown · non-contrast
Comparison: No prior.

CLINICAL DATA: Fall on outstretched hand.

EXAM:
LEFT HAND - COMPLETE 3+ VIEW

[hand pa]
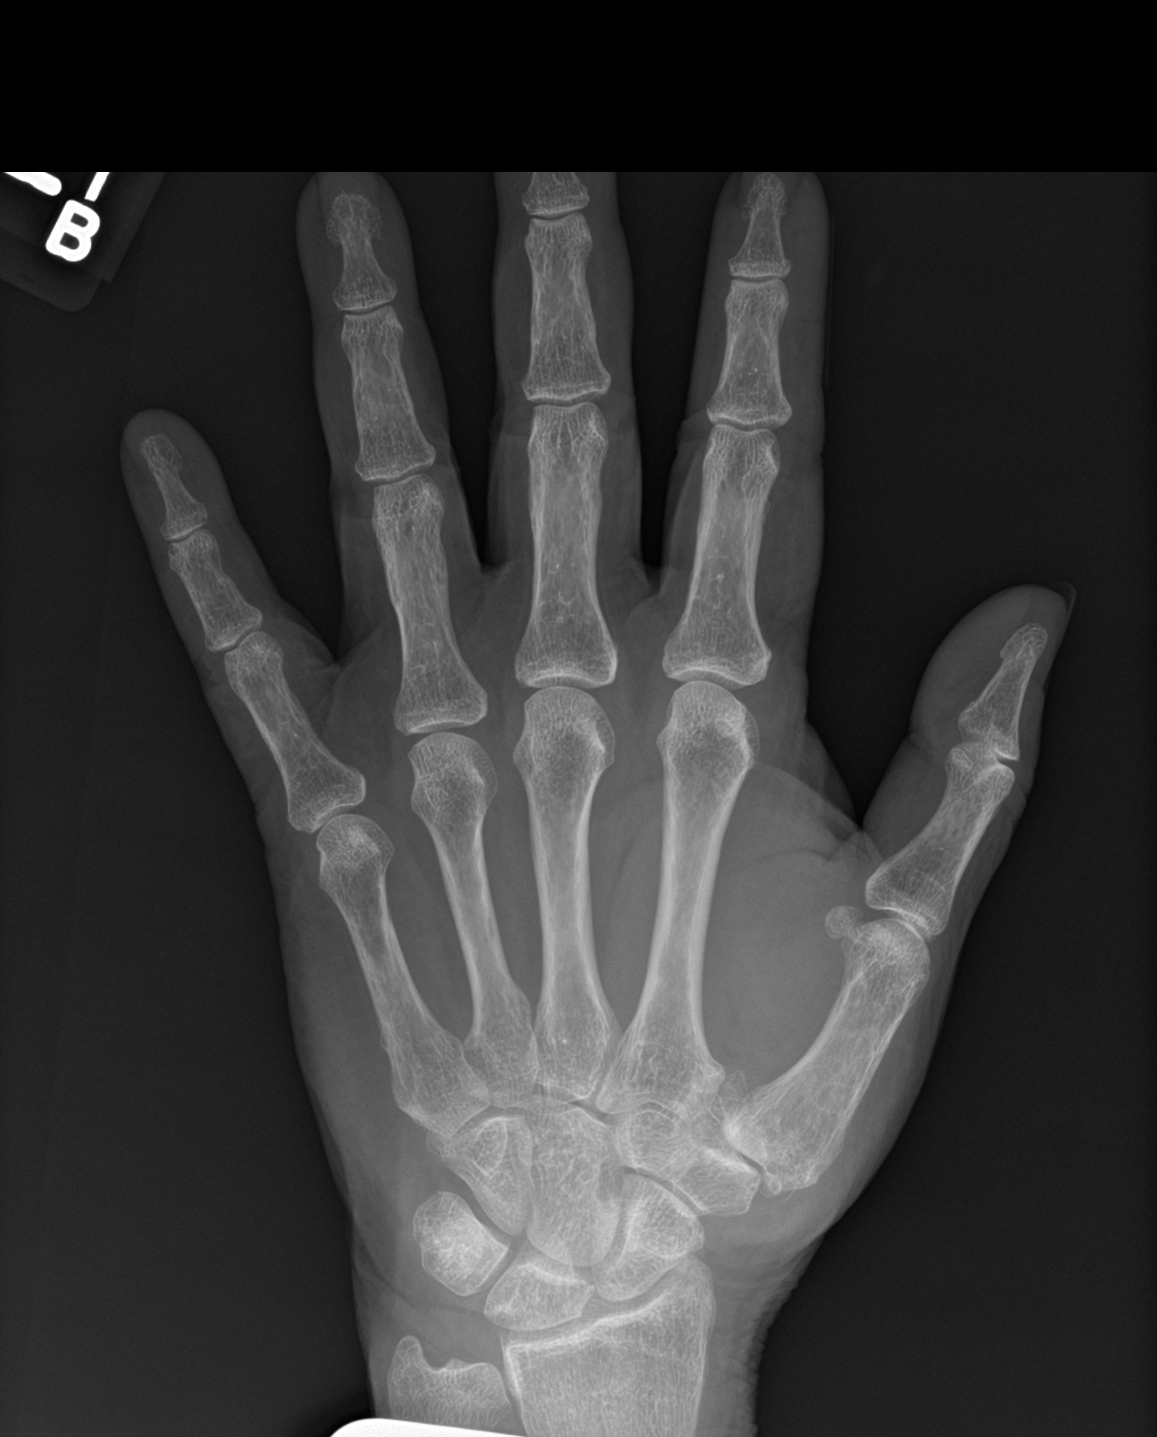

[hand obl]
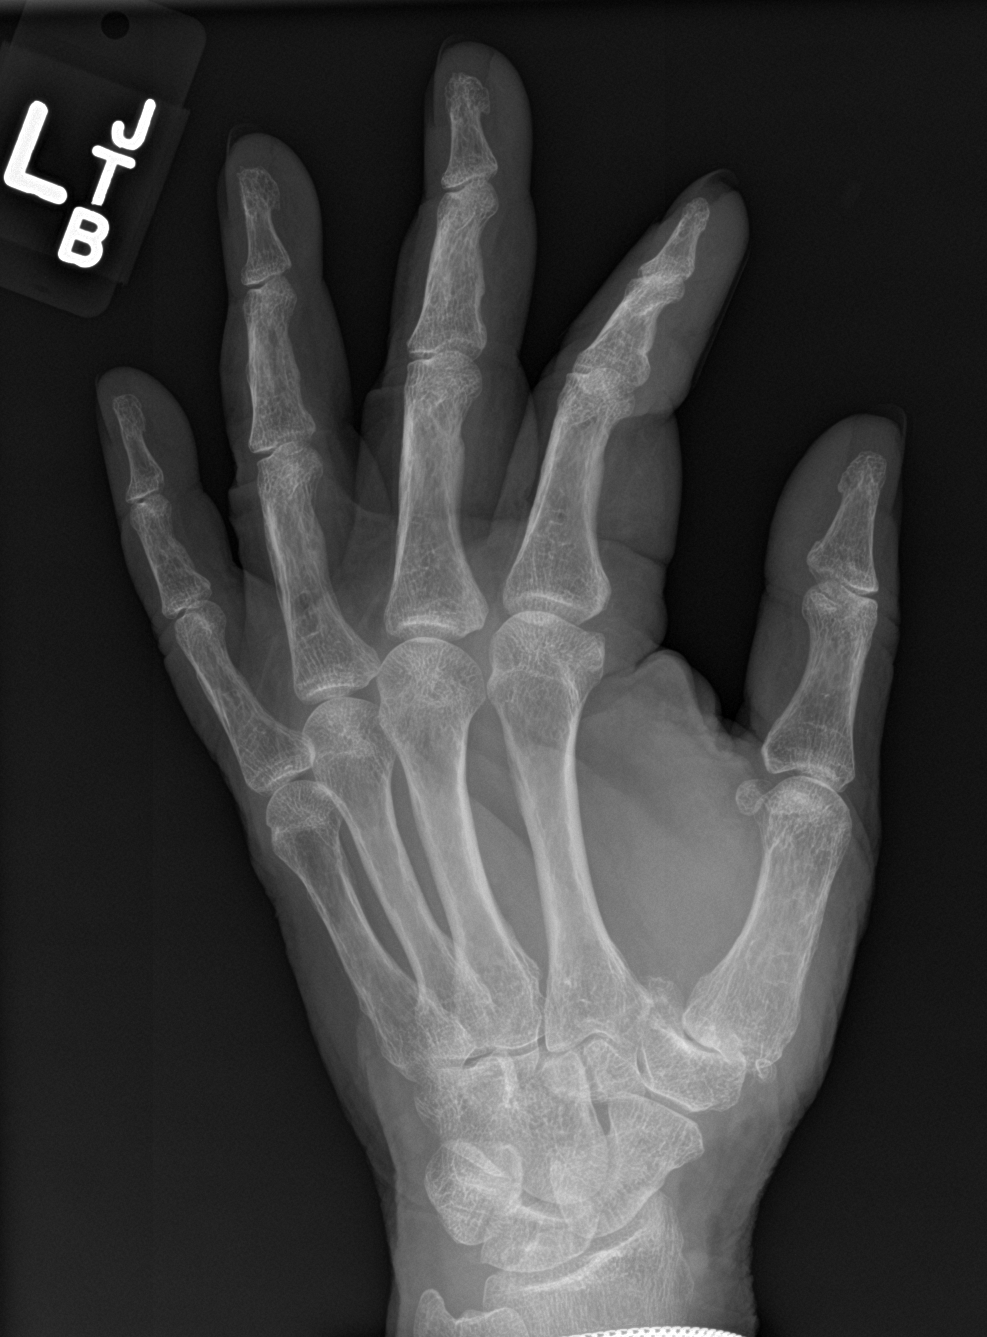

[hand lat]
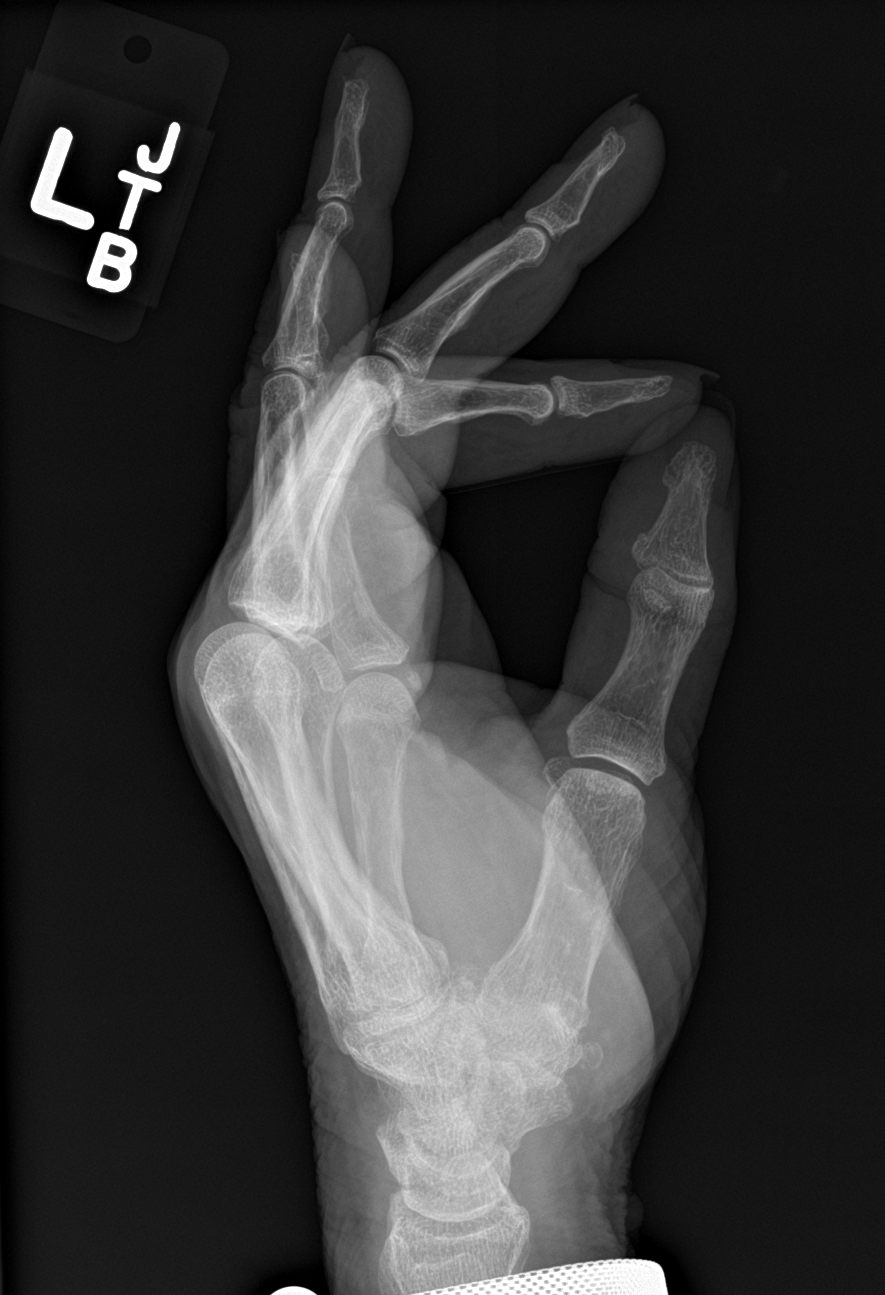

[3 of 3 positions shown; findings below may reference images not displayed]

FINDINGS: Diffuse degenerative change, most prominent about the first
carpometacarpal joint. No acute bony or joint abnormality
identified. No evidence of fracture or dislocation.
IMPRESSION: Diffuse degenerative change, most prominent about the first
carpometacarpal joint. No acute abnormality identified.

## 2021-07-10 ENCOUNTER — Other Ambulatory Visit (HOSPITAL_BASED_OUTPATIENT_CLINIC_OR_DEPARTMENT_OTHER): Payer: Self-pay

## 2021-07-14 ENCOUNTER — Ambulatory Visit: Payer: Medicare Other | Admitting: Psychology

## 2021-07-28 ENCOUNTER — Ambulatory Visit: Payer: Medicare Other | Admitting: Psychology

## 2021-08-11 ENCOUNTER — Ambulatory Visit: Payer: Medicare Other | Admitting: Psychology

## 2021-08-15 ENCOUNTER — Ambulatory Visit (INDEPENDENT_AMBULATORY_CARE_PROVIDER_SITE_OTHER): Payer: Medicare Other | Admitting: Psychology

## 2021-08-15 DIAGNOSIS — F4323 Adjustment disorder with mixed anxiety and depressed mood: Secondary | ICD-10-CM | POA: Diagnosis not present

## 2021-08-15 NOTE — Progress Notes (Signed)
                          Date: 08/15/2021  Diagnosis Adj. Disorder with anxiety and depression Symptoms unspecified Medication Status compliance  Safety none  If Suicidal or Homicidal State Action Taken: unspecified  Current Risk: low Medications unspecified Objectives unspecified Client Response full compliance  Service Location Location, [address] [place], [place] [address]  Service Code cpt [phone]  Normalize/Reframe  Facilitate problem solving  Validate/empathize  Distress tolerance skill  Emotion regulation skills  Self care activities  Self-monitoring  Mindfulness training  Session Notes:  Dx: Adjustment Disorder with Anxiety and Depression  Meds: [place] for asthma, zolair (injection), blood pressure Meds. No psychotropic.  Goals: Patient is looking to reduce symptoms of anxiety and depression. Goal date is 5-23.  Patient agrees to a video [employer]) session. She is at home and I am in my home office. Elaine Luna has some great trips planned, but says that on a daily basis, it is difficult for her to get out of bed. She has been having some thoughts of her own mortality. Said she is not fearful of dying, but doesn't want to leave a mess. Acknowledges tat she needs more structure in her life. She has stopped going to [employer] at [employer]. She cannot understand that she tends to focus on the negative. She finds herself grieving the loss of her son from 32 years ago. She is thinking that when she returns from her travels, she can create the necessary structure in her day. She says that at one point in past month she was thinking she may not want to continue to live. Did not have a suicidal plan, but was just out of patience with herself. Will need to cancel a few appointments due to some extensive travel.               Elaine Morel, PhD 2:35p-3:30p 55 minutes

## 2021-08-25 ENCOUNTER — Ambulatory Visit: Payer: Medicare Other | Admitting: Psychology

## 2021-09-08 ENCOUNTER — Ambulatory Visit (INDEPENDENT_AMBULATORY_CARE_PROVIDER_SITE_OTHER): Payer: Medicare Other | Admitting: Psychology

## 2021-09-08 ENCOUNTER — Ambulatory Visit: Payer: Medicare Other | Admitting: Psychology

## 2021-09-08 DIAGNOSIS — F4323 Adjustment disorder with mixed anxiety and depressed mood: Secondary | ICD-10-CM | POA: Diagnosis not present

## 2021-09-08 NOTE — Progress Notes (Signed)
                                         Date: 09/08/2021  Diagnosis Adj. Disorder with anxiety and depression Symptoms unspecified Medication Status compliance  Safety none  If Suicidal or Homicidal State Action Taken: unspecified  Current Risk: low Medications unspecified Objectives unspecified Client Response full compliance  Service Location Location, [address] [place], [place] [address]  Service Code cpt [phone]  Normalize/Reframe  Facilitate problem solving  Validate/empathize  Distress tolerance skill  Emotion regulation skills  Self care activities  Self-monitoring  Mindfulness training  Session Notes:  Dx: Adjustment Disorder with Anxiety and Depression  Meds: [place] for asthma, zolair (injection), blood pressure Meds. No psychotropic.  Goals: Patient is looking to reduce symptoms of anxiety and depression. Goal date is 5-23.  Patient agrees to a video [employer]) session. She is at home and I am in my home office. Elaine Luna got back from taking her granddaughters to [place]. It was a "wonderful" trip and she is very pleased. She says that since being home, she obsesses over decisions and concerns about how she will die and who will take care of her things.  She went to the doctor for her 6 month check up. She has lost more weight and feels good. Her labs were all good even though she thought something may be wrong. We talked about using the positive experiences and memories as a way to pull herself out of negative/anxious times. We talked about some good volunteer opportunities                  [name], PhD 1:05p-2:00p 55 minutes

## 2021-09-22 ENCOUNTER — Ambulatory Visit: Payer: Medicare Other | Admitting: Psychology

## 2021-10-09 ENCOUNTER — Ambulatory Visit (INDEPENDENT_AMBULATORY_CARE_PROVIDER_SITE_OTHER): Payer: Medicare Other | Admitting: Psychology

## 2021-10-09 DIAGNOSIS — F4323 Adjustment disorder with mixed anxiety and depressed mood: Secondary | ICD-10-CM

## 2021-10-09 IMAGING — DX DG CHEST 1V PORT
1 series · 1 of 1 positions shown · non-contrast
Comparison: November 14, 2018

CLINICAL DATA: Shortness of breath since yesterday.

EXAM:
PORTABLE CHEST 1 VIEW

[chest]
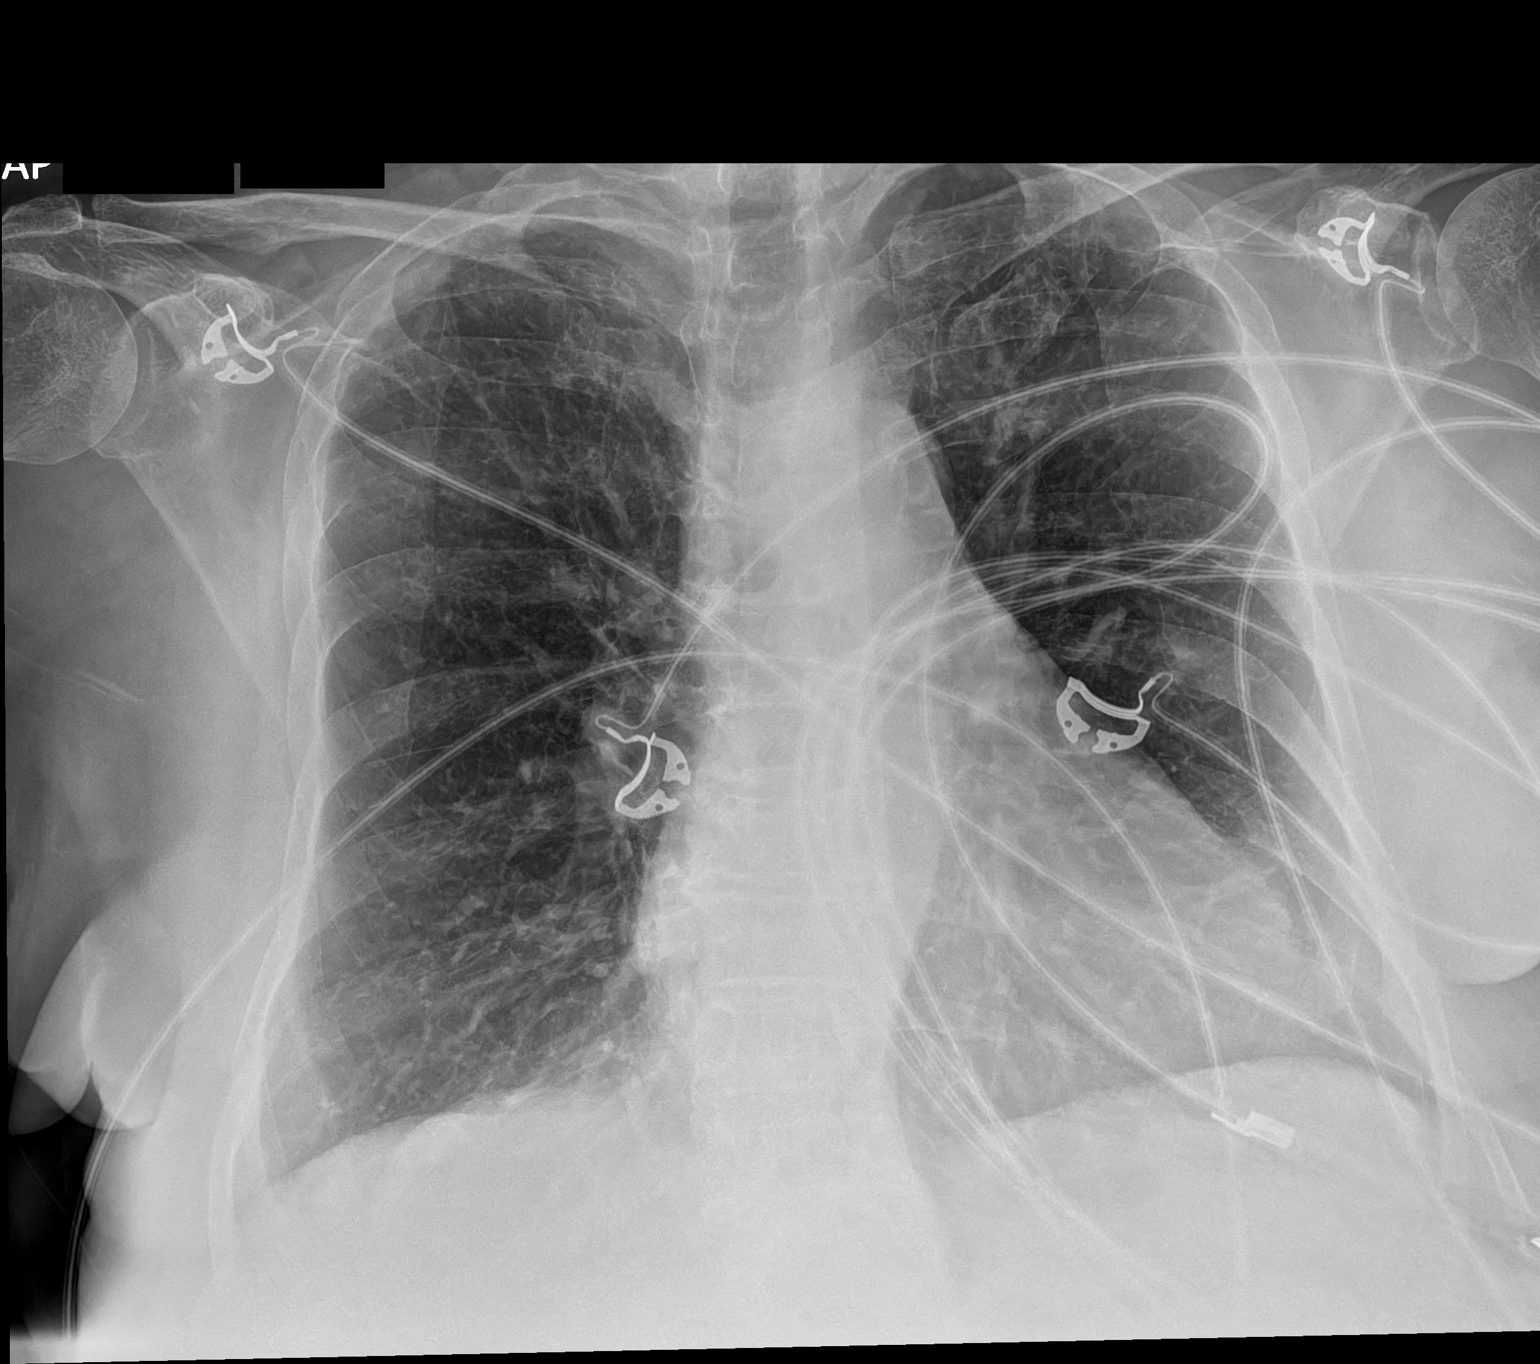

[1 of 1 positions shown; findings below may reference images not displayed]

FINDINGS: The heart size and mediastinal contours are within normal limits.
Both lungs are clear. Lungs are hyperinflated. The visualized
skeletal structures are unremarkable.
IMPRESSION: No active cardiopulmonary disease. COPD.

## 2021-10-09 NOTE — Progress Notes (Signed)
                                                        Date: 10/09/2021  Diagnosis Adj. Disorder with anxiety and depression Symptoms unspecified Medication Status compliance  Safety none  If Suicidal or Homicidal State Action Taken: unspecified  Current Risk: low Medications unspecified Objectives unspecified Client Response full compliance  Service Location Location, [address] [place], [place] [address]  Service Code cpt [phone]  Normalize/Reframe  Facilitate problem solving  Validate/empathize  Distress tolerance skill  Emotion regulation skills  Self care activities  Self-monitoring  Mindfulness training  Session Notes:  Dx: Adjustment Disorder with Anxiety and Depression  Meds: [place] for asthma, zolair (injection), blood pressure Meds. No psychotropic.  Goals: Patient is looking to reduce symptoms of anxiety and depression. Goal date is 5-23.  Patient agrees to a video [employer]) session. She is at home and I am in my home office. Elaine Luna says she has "waves of anxiety" and does not know how to stop it. She says when she first wakes up in the morning, she feels good like there are no problems. Within minutes, she starts thinking about things that are upsetting like the fact that her sons' have passed. 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 This is once a week for 6 weeks every few months. I suggested that she find a volunteer activity that is more hours on a more regular basis. She agrees because she is not anxious when she is busy. She says she would love doing "anything in the ED". She sent an inquiry to the director of the ED at [facility], but has not heard back. She says she will reach back out.  Will report to me at next session about her progress toward finding the "right" volunteer activity.                         [name], PhD 1:05p-2:00p 55 minutes

## 2021-10-20 ENCOUNTER — Ambulatory Visit (INDEPENDENT_AMBULATORY_CARE_PROVIDER_SITE_OTHER): Payer: Medicare Other | Admitting: Psychology

## 2021-10-20 DIAGNOSIS — F4323 Adjustment disorder with mixed anxiety and depressed mood: Secondary | ICD-10-CM | POA: Diagnosis not present

## 2021-10-20 NOTE — Progress Notes (Addendum)
                                                                       Date: 10/20/2021  Diagnosis Adj. Disorder with anxiety and depression Symptoms unspecified Medication Status compliance  Safety none  If Suicidal or Homicidal State Action Taken: unspecified  Current Risk: low Medications unspecified Objectives unspecified Client Response full compliance  Service Location Location, [address] [place], [place] [address]  Service Code cpt [phone]  Normalize/Reframe  Facilitate problem solving  Validate/empathize  Distress tolerance skill  Emotion regulation skills  Self care activities  Self-monitoring  Mindfulness training  Session Notes:  Dx: Adjustment Disorder with Anxiety and Depression  Meds: [place] for asthma, zolair (injection), blood pressure Meds. No psychotropic.  Goals: Patient is looking to reduce symptoms of anxiety and depression. Needs help adjusting to retirement. Motivation and energy is very low. Individual therapy to define stategies to reduce symptoms. Goal date is 12-23.  Patient agrees to a video [employer]) session. She is at home and I am in my home office. [name] says"I think I am becoming more mentally ill because I don't have control any more". She went to [facility] for her grandson's wedding and it went well. When she got home, she started to feel anxious again. She has a feeling of fear, but admits she has nothing to be fearful of, "but my mind runs away with itself"? She has checked in to her volunteer options. Will be going to the beach with a group of friends. She looks forward to going. She clearly does well when busy and does not get anxious. It is when she is alone and has "nothing to do" that she finds herself anxious. Be end of session, she is convinced she is not getting "more mentally ill".                             Elaine Morel, PhD 1:05p-2:00p 55  minutes

## 2021-11-03 ENCOUNTER — Ambulatory Visit (INDEPENDENT_AMBULATORY_CARE_PROVIDER_SITE_OTHER): Payer: Medicare Other | Admitting: Psychology

## 2021-11-03 DIAGNOSIS — F4323 Adjustment disorder with mixed anxiety and depressed mood: Secondary | ICD-10-CM

## 2021-11-03 NOTE — Progress Notes (Signed)
                                                                                      Date: 11/03/2021  Diagnosis Adj. Disorder with anxiety and depression Symptoms unspecified Medication Status compliance  Safety none  If Suicidal or Homicidal State Action Taken: unspecified  Current Risk: low Medications unspecified Objectives unspecified Client Response full compliance  Service Location Location, [address] [place], [place] [address]  Service Code cpt [phone]  Normalize/Reframe  Facilitate problem solving  Validate/empathize  Distress tolerance skill  Emotion regulation skills  Self care activities  Self-monitoring  Mindfulness training  Session Notes:  Dx: Adjustment Disorder with Anxiety and Depression  Meds: [place] for asthma, zolair (injection), blood pressure Meds. No psychotropic.  Goals: Patient is looking to reduce symptoms of anxiety and depression. Needs help adjusting to retirement. Motivation and energy is very low. Individual therapy to define stategies to reduce symptoms. Goal date is 12-23.  Patient agrees to a video [employer]) session. She is at home and I am in my home office. Elaine Luna went to the beach with 11 other people. She had a nice time, but had a lot of bug bites. She has been thinking about moving to [place] near her daughter and other family. She has a lot of friends here, but is feeling it may be best to make the move.  She is planning to take 78 year old son to [place] for a river cruise October 25th. She has decided since she never heard back from the ER about volunteering, she will reach back out after her travels. While she was at the beach, she talked about her therapy. She says she needs more feedback and more suggestions. She feels that she did "bad things" in her life that make her feel like a bad person. She got divorced, cheated on her husband and  gave up a child. She is trying to "make up for the past". We talked about choosing 1 activity a week to feel good about. Will report in.                                [name], PhD 1:15p-2:00p 45 minutes

## 2021-11-04 ENCOUNTER — Other Ambulatory Visit (HOSPITAL_BASED_OUTPATIENT_CLINIC_OR_DEPARTMENT_OTHER): Payer: Self-pay

## 2021-11-04 MED ORDER — INFLUENZA VAC A&B SA ADJ QUAD 0.5 ML IM PRSY
PREFILLED_SYRINGE | INTRAMUSCULAR | 0 refills | Status: DC
  Filled 2021-11-04: qty 0.5, 1d supply, fill #0

## 2021-11-07 ENCOUNTER — Other Ambulatory Visit (HOSPITAL_BASED_OUTPATIENT_CLINIC_OR_DEPARTMENT_OTHER): Payer: Self-pay

## 2021-11-17 ENCOUNTER — Other Ambulatory Visit (HOSPITAL_BASED_OUTPATIENT_CLINIC_OR_DEPARTMENT_OTHER): Payer: Self-pay

## 2021-11-17 ENCOUNTER — Ambulatory Visit (INDEPENDENT_AMBULATORY_CARE_PROVIDER_SITE_OTHER): Payer: Medicare Other | Admitting: Psychology

## 2021-11-17 DIAGNOSIS — F4323 Adjustment disorder with mixed anxiety and depressed mood: Secondary | ICD-10-CM

## 2021-11-17 MED ORDER — COVID-19 MRNA 2023-2024 VACCINE (COMIRNATY) 0.3 ML INJECTION
INTRAMUSCULAR | 0 refills | Status: DC
  Filled 2021-11-17: qty 0.3, 1d supply, fill #0
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
## 2021-11-24 ENCOUNTER — Other Ambulatory Visit (HOSPITAL_BASED_OUTPATIENT_CLINIC_OR_DEPARTMENT_OTHER): Payer: Self-pay

## 2021-11-24 MED ORDER — AREXVY 120 MCG/0.5ML IM SUSR
INTRAMUSCULAR | 0 refills | Status: DC
  Filled 2021-11-24: qty 0.5, 1d supply, fill #0

## 2021-11-25 ENCOUNTER — Other Ambulatory Visit (HOSPITAL_BASED_OUTPATIENT_CLINIC_OR_DEPARTMENT_OTHER): Payer: Self-pay

## 2021-12-01 ENCOUNTER — Ambulatory Visit: Payer: Medicare Other | Admitting: Psychology

## 2021-12-12 ENCOUNTER — Other Ambulatory Visit (HOSPITAL_BASED_OUTPATIENT_CLINIC_OR_DEPARTMENT_OTHER): Payer: Self-pay

## 2021-12-13 ENCOUNTER — Other Ambulatory Visit (HOSPITAL_BASED_OUTPATIENT_CLINIC_OR_DEPARTMENT_OTHER): Payer: Self-pay

## 2021-12-15 ENCOUNTER — Ambulatory Visit (INDEPENDENT_AMBULATORY_CARE_PROVIDER_SITE_OTHER): Payer: Medicare Other | Admitting: Psychology

## 2021-12-15 DIAGNOSIS — F4323 Adjustment disorder with mixed anxiety and depressed mood: Secondary | ICD-10-CM | POA: Diagnosis not present

## 2021-12-15 NOTE — Progress Notes (Signed)
   Date: 12/15/2021  Diagnosis Adj. Disorder with anxiety and depression Symptoms unspecified Medication Status compliance  Safety none  If Suicidal or Homicidal State Action Taken: unspecified  Current Risk: low Medications unspecified Objectives unspecified Client Response full compliance  Service Location Location, [address] [place], [place] [address]  Service Code cpt [phone]  Normalize/Reframe  Facilitate problem solving  Validate/empathize  Distress tolerance skill  Emotion regulation skills  Self care activities  Self-monitoring  Mindfulness training  Session Notes:  Dx: Adjustment Disorder with Anxiety and Depression  Meds: [place]el[place]ast for asthma, zolair (injection), blood pressure Meds. No psychotropic.  Goals: Patient is looking to reduce symptoms of anxiety and depression. Needs help adjusting to retirement. Motivation and energy is very low. Individual therapy to define stategies to reduce symptoms. Goal date is 12-23.  Patient agrees to a video [employer]) session. She is at home and I am in my home office. Elaine Luna states that she was gone for 2 1/2 weeks. Was with daughter and her family and the took grandson on cruise in [place]. It was a great experience. He is 24 and made friends with lots of people. She will go to [place] in April with her daughter in [place] mother (who is a Industrial/product designer). She feels best when busy and traveling. Plans to keep traveling and visiting family/friends. She reflected in her early life and the fact that she did not meet her father until she was 80.  She is still working to come to terms with the decisions she made in her life at that time. We will address some of her unresolved grief, but also focus on accomplishments.                                       [name], PhD 1:15p-2:00p 45 minutes

## 2021-12-29 ENCOUNTER — Ambulatory Visit (INDEPENDENT_AMBULATORY_CARE_PROVIDER_SITE_OTHER): Payer: Medicare Other | Admitting: Psychology

## 2021-12-29 DIAGNOSIS — F4323 Adjustment disorder with mixed anxiety and depressed mood: Secondary | ICD-10-CM | POA: Diagnosis not present

## 2021-12-29 NOTE — Progress Notes (Signed)
   Date: 12/29/2021  Diagnosis Adj. Disorder with anxiety and depression Symptoms unspecified Medication Status compliance  Safety none  If Suicidal or Homicidal State Action Taken: unspecified  Current Risk: low Medications unspecified Objectives unspecified Client Response full compliance  Service Location Location, [address] [place], [place] [address]  Service Code cpt [phone]  Normalize/Reframe  Facilitate problem solving  Validate/empathize  Distress tolerance skill  Emotion regulation skills  Self care activities  Self-monitoring  Mindfulness training  Session Notes:  Dx: Adjustment Disorder with Anxiety and Depression  Meds: [place]ast for asthma, zolair (injection), blood pressure Meds. No psychotropic.  Goals: Patient is looking to reduce symptoms of anxiety and depression. Needs help adjusting to retirement. Motivation and energy is very low. Individual therapy to define stategies to reduce symptoms. Goal date is 12-23.  Patient agrees to a video [employer]) session. She is at home and I am in my home office. Elaine Luna says her son, his wife and their daughter came for Thanksgiving. She made the dinner and it was a quiet, but nice Thanksgiving. She is experiencing the loss of her parents more recently. We reflected that it is more likely related to the small gathering at the holiday. She has been hearing from her grandson that she traveled with last month and he remains very grateful for the experience. She talked about her strong ability to sleep, in spite of her mood states. We also addressed how to move forward with volunteering. She wants to find something more meaningful.                                           Elaine Morel, PhD 1:15p-2:00p 45 minutes

## 2022-01-12 ENCOUNTER — Ambulatory Visit: Payer: Medicare Other | Admitting: Psychology

## 2022-02-03 ENCOUNTER — Encounter: Payer: Self-pay | Admitting: Internal Medicine

## 2022-02-09 ENCOUNTER — Ambulatory Visit (INDEPENDENT_AMBULATORY_CARE_PROVIDER_SITE_OTHER): Payer: Medicare Other | Admitting: Psychology

## 2022-02-09 DIAGNOSIS — F4323 Adjustment disorder with mixed anxiety and depressed mood: Secondary | ICD-10-CM

## 2022-02-09 NOTE — Progress Notes (Signed)
   Date: 02/09/2022  Diagnosis Adj. Disorder with anxiety and depression Symptoms unspecified Medication Status compliance  Safety none  If Suicidal or Homicidal State Action Taken: unspecified  Current Risk: low Medications unspecified Objectives unspecified Client Response full compliance  Service Location Location, [address] [place], [place] [address]  Service Code cpt [id number]  Normalize/Reframe  Facilitate problem solving  Validate/empathize  Distress tolerance skill  Emotion regulation skills  Self care activities  Self-monitoring  Mindfulness training  Session Notes:  Dx: Adjustment Disorder with Anxiety and Depression  Meds: [place]el[place]ast for asthma, zolair (injection), blood pressure Meds. No psychotropic.  Goals: Patient is looking to reduce symptoms of anxiety and depression. Needs help adjusting to retirement. Motivation and energy is very low. Individual therapy to define stategies to reduce symptoms. Goal date is 12-23. Revised goal date is 12-24  Patient agrees to a video ([employer]) session. She is at home and I am in my home office. Elaine Luna says her visit with children and grandchildren was good. Says, however, she doesn't feel well mentally. She gets stressed over relatively small tasks, like getting her car inspected. She is worried about her 17 year old granddaughter who gets very distressed when upset and will "self injure" (scratch self). She chose to "stay out of it". Also, her deceased son's wife told her she can move in with her at any time. We talked about cognitive strategies to stop the worry cycle ("what is worst that could happen?").  She wants to reduce her social media time, which is about 4 hours/day. We talked about a gradual shift and the best ways to utilize her time.                                              Elaine Morel, PhD 1:15p-2:00p 45 minutes

## 2022-02-20 ENCOUNTER — Other Ambulatory Visit (HOSPITAL_BASED_OUTPATIENT_CLINIC_OR_DEPARTMENT_OTHER): Payer: Self-pay

## 2022-02-20 MED ORDER — IRBESARTAN 300 MG PO TABS
300.0000 mg | ORAL_TABLET | Freq: Every day | ORAL | 3 refills | Status: DC
  Filled 2022-02-20: qty 45, 45d supply, fill #0
  Filled 2022-07-29: qty 45, 45d supply, fill #1

## 2022-02-20 MED ORDER — ATORVASTATIN CALCIUM 20 MG PO TABS
20.0000 mg | ORAL_TABLET | Freq: Every day | ORAL | 3 refills | Status: DC
  Filled 2022-02-20: qty 90, 90d supply, fill #0

## 2022-02-20 MED ORDER — PANTOPRAZOLE SODIUM 20 MG PO TBEC
20.0000 mg | DELAYED_RELEASE_TABLET | Freq: Every day | ORAL | 3 refills | Status: DC
  Filled 2022-02-20: qty 90, 90d supply, fill #0

## 2022-02-23 ENCOUNTER — Ambulatory Visit (INDEPENDENT_AMBULATORY_CARE_PROVIDER_SITE_OTHER): Payer: Medicare Other | Admitting: Psychology

## 2022-02-23 DIAGNOSIS — F4323 Adjustment disorder with mixed anxiety and depressed mood: Secondary | ICD-10-CM

## 2022-02-23 NOTE — Progress Notes (Signed)
   Date: 02/23/2022  Diagnosis Adj. Disorder with anxiety and depression Symptoms unspecified Medication Status compliance  Safety none  If Suicidal or Homicidal State Action Taken: unspecified  Current Risk: low Medications unspecified Objectives unspecified Client Response full compliance  Service Location Location, [address] [place],  [address]  Service Code cpt [phone]  Normalize/Reframe  Facilitate problem solving  Validate/empathize  Distress tolerance skill  Emotion regulation skills  Self care activities  Self-monitoring  Mindfulness training  Session Notes:  Dx: Adjustment Disorder with Anxiety and Depression  Meds: [place]ast for asthma, zolair (injection), blood pressure Meds. No psychotropic.  Goals: Patient is looking to reduce symptoms of anxiety and depression. Needs help adjusting to retirement. Motivation and energy is very low. Individual therapy to define stategies to reduce symptoms. Goal date is 12-23. Revised goal date is 12-24  Patient agrees to a video (Webex) session. She is at home and I am in my home office. Kennya says she "is not in a good state", feeling anxious and unhappy. She tries to stay busy going to games (sporting events) and the gym. Still seeking a volunteer activity that she can attend on a regular basis. She wants some additional ideas of organizations she get contact to volunteer.   We talked about making a video for each of her grandchildren and great grandchildren. She likes the idea of having something to give them about their history. This also helps her feel a sense of purpose.                                                    [name], PhD 1:15p-2:00p 45 minutes

## 2022-03-09 ENCOUNTER — Ambulatory Visit (INDEPENDENT_AMBULATORY_CARE_PROVIDER_SITE_OTHER): Payer: Medicare Other | Admitting: Psychology

## 2022-03-09 DIAGNOSIS — F4323 Adjustment disorder with mixed anxiety and depressed mood: Secondary | ICD-10-CM

## 2022-03-09 NOTE — Progress Notes (Signed)
                  Date: 03/09/2022  Diagnosis Adj. Disorder with anxiety and depression Symptoms unspecified Medication Status compliance  Safety none  If Suicidal or Homicidal State Action Taken: unspecified  Current Risk: low Medications unspecified Objectives unspecified Client Response full compliance  Service Location Location, [address] [place], [place] [address]  Service Code cpt [phone]  Normalize/Reframe  Facilitate problem solving  Validate/empathize  Distress tolerance skill  Emotion regulation skills  Self care activities  Self-monitoring  Mindfulness training  Session Notes:  Dx: Adjustment Disorder with Anxiety and Depression  Meds: [place]el[place]ast for asthma, zolair (injection), blood pressure Meds. No psychotropic.  Goals: Patient is looking to reduce symptoms of anxiety and depression. Needs help adjusting to retirement. Motivation and energy is very low. Individual therapy to define stategies to reduce symptoms. Goal date is 12-23. Revised goal date is 12-24  Patient agrees to a video (Webex) session. She is at home and I am in my home office. Elaine Luna still struggles with "impending doom" but realizes it is "irrational". She did have a good and active weekend. She is also having a Network engineer. Says she sometimes feels alone and abandoned by family. Her daughter barely calls weekly and Elaine Luna says she used to call her mother daily. She talked about the desire, at some point, to move up [place] to be near family. She is extremely involved in a variety activities that gives her purpose.  She did start writing out info that she wants to share with her kids and grandkids, as we discussed at the last session. She wants to continue to write before recording.                                                         Elaine Morel, PhD 1:10p-2:00p 50 minutes

## 2022-03-11 ENCOUNTER — Ambulatory Visit (INDEPENDENT_AMBULATORY_CARE_PROVIDER_SITE_OTHER): Payer: Medicare Other | Admitting: Pulmonary Disease

## 2022-03-11 ENCOUNTER — Other Ambulatory Visit (HOSPITAL_BASED_OUTPATIENT_CLINIC_OR_DEPARTMENT_OTHER): Payer: Self-pay

## 2022-03-11 ENCOUNTER — Encounter (HOSPITAL_BASED_OUTPATIENT_CLINIC_OR_DEPARTMENT_OTHER): Payer: Self-pay | Admitting: Pulmonary Disease

## 2022-03-11 VITALS — BP 122/70 | HR 50 | Ht 64.0 in | Wt 135.2 lb

## 2022-03-11 DIAGNOSIS — J4550 Severe persistent asthma, uncomplicated: Secondary | ICD-10-CM

## 2022-03-11 MED ORDER — ALBUTEROL SULFATE HFA 108 (90 BASE) MCG/ACT IN AERS
2.0000 | INHALATION_SPRAY | Freq: Four times a day (QID) | RESPIRATORY_TRACT | 1 refills | Status: DC | PRN
  Filled 2022-03-11: qty 6.7, 28d supply, fill #0

## 2022-03-11 NOTE — Progress Notes (Signed)
Subjective:   PATIENT ID: Elaine Luna GENDER: female DOB: 05/28/1945, MRN: [id number]   HPI  Chief Complaint  Patient presents with   Follow-up    Doing well   Reason for Visit: Follow-up   [name] is a 77 year old female with severe persistent asthma, hx of COVID-19 10/2018, hx bilateral segmental pulmonary emboli s/p anticoagulation x 3 months who presents for follow-up.  11/25/20 She reports her asthma is well-controlled on Xolair and Qvair 1-2 times a day. Not on Dulera. Has not had an asthma exacerbation in 2 years. Denies cough, wheezing or shortness of breath. She is enrolled in [employer] and does two zumba classes a day five days a week.   02/07/21 Since discontinuing Xolair she reports her symptoms are well controlled. Qvar one puff twice day. Not needing albuterol inhaler. She is active with [employer] and going to zumba daily. Over the holiday she had a flu like illness with sore throat but no respiratory symptoms.  03/11/22 Since our last visit she denies shortness of breath, cough or wheezing. Rarely uses her albuterol inhaler. Has been trying to apply for volunteering but not accepted. Wanting to be more involved in the community.   Social Hx Her first son passed from suicide. Her second son recently passed away from [place] in 05-08-2020.  Past Medical History:  Diagnosis Date   Asthma    Colon polyps    DM type 2 (diabetes mellitus, type 2) ([facility]) 2013-05-08    Hyperlipidemia    Hypertension    Obesity    Pulmonary embolism associated with COVID-19 ([facility]) 12/25/2018   Tubular adenoma     Outpatient Medications Prior to Visit  Medication Sig Dispense Refill   albuterol (PROVENTIL) (2.5 MG/3ML) 0.083% nebulizer solution USE 1 VIAL IN NEBULIZER EVERY 6 HOURS AS NEEDED FOR WHEEZING. (Patient not taking: Reported on 11/25/2020) 75 mL 1   albuterol (VENTOLIN HFA) 108 (90 Base) MCG/ACT inhaler INHALE 1-2 PUFFS BY MOUTH 4 TIMES DAILY AS NEEDED (Patient  not taking: Reported on 02/07/2021) 54 g 0   atorvastatin (LIPITOR) 20 MG tablet Take 1 tablet (20 mg total) by mouth daily. 90 tablet 3   atorvastatin (LIPITOR) 20 MG tablet Take 1 tablet (20 mg total) by mouth daily. 90 tablet 3   COVID-19 mRNA bivalent vaccine, Pfizer, (PFIZER COVID-19 VAC BIVALENT) injection Inject into the muscle. 0.5 mL 0   COVID-19 mRNA bivalent vaccine, Pfizer, (PFIZER COVID-19 VAC BIVALENT) injection Inject into the muscle. 0.3 mL 0   COVID-19 mRNA vaccine 2023-2024 (COMIRNATY) SUSP injection Inject into the muscle. 0.3 mL 0   Denosumab (PROLIA [place]) Inject into the skin every 6 (six) months.     fluticasone (FLOVENT HFA) 110 MCG/ACT inhaler Inhale 1 puff into the lungs in the morning and at bedtime. 12 g 12   hydrochlorothiazide (HYDRODIURIL) 25 MG tablet TAKE 1 TABLET BY MOUTH ONCE A DAY AS NEEDED 90 tablet 3   influenza vaccine adjuvanted (FLUAD QUADRIVALENT) 0.5 ML injection Inject into the muscle. 0.5 mL 0   influenza vaccine adjuvanted (FLUAD) 0.5 ML injection Inject into the muscle. 0.5 mL 0   irbesartan (AVAPRO) 300 MG tablet TAKE 1 TABLET BY MOUTH ONCE DAILY 90 tablet 2   irbesartan (AVAPRO) 300 MG tablet TAKE 1 TABLET BY MOUTH ONCE DAILY 90 tablet 2   irbesartan (AVAPRO) 300 MG tablet TAKE 1/2 TABLET BY MOUTH ONCE DAILY 45 tablet 3   pantoprazole (PROTONIX) 20 MG tablet Take  1 tablet (20 mg total) by mouth daily. 90 tablet 3   pantoprazole (PROTONIX) 40 MG tablet Take 1 tablet (40 mg total) by mouth daily. 90 tablet 3   RSV vaccine recomb adjuvanted (AREXVY) 120 MCG/0.5ML injection Inject into the muscle. 0.5 mL 0   Vitamin D, Ergocalciferol, (DRISDOL) 1.25 MG (50000 UNIT) CAPS capsule Take 1 capsule (50,000 Units total) by mouth 2 (two) times a week. 24 capsule 3   No facility-administered medications prior to visit.    Review of Systems  Constitutional:  Negative for chills, diaphoresis, fever, malaise/fatigue and weight loss.  HENT:  Negative for congestion.    Respiratory:  Negative for cough, hemoptysis, sputum production, shortness of breath and wheezing.   Cardiovascular:  Negative for chest pain, palpitations and leg swelling.     Objective:   Vitals:   03/11/22 1134  BP: 122/70  Pulse: (!) 50  SpO2: 98%  Weight: 135 lb 3.2 oz (61.3 kg)  Height: '5\' 4"'$  (1.626 m)   SpO2: 98 % O2 Device: None (Room air)  Physical Exam: General: Well-appearing, no acute distress HENT: [place], AT Eyes: EOMI, no scleral icterus Respiratory: Clear to auscultation bilaterally.  No crackles, wheezing or rales Cardiovascular: RRR, -M/R/G, no JVD Extremities:-Edema,-tenderness Neuro: AAO x4, CNII-XII grossly intact Psych: Normal mood, normal affect   Data Reviewed:  Imaging: HRCT 04/02/16 - air trapping present on expiration, mild cylindrical bronchiectasis and scattered sub-centimeter pulmonary nodules < 4 mm that are unchanged  CTA 11/14/18 - several bilateral tiny pulmonary emboli in segmental branches of RUL, RLL, lingula and LLL. Stable bilateral pulmonary nodules  CXR 05/18/20 - hyperinflated  PFT: None on file    Assessment & Plan:   Discussion: 77 year old female with severe persistent asthma, hx aspergillus IgE antibody in 2017, hx PE in setting of covid in 2019 and hx benign pulmonary nodules who presents for follow-up. Off Xolair since 11/2020 with no recurrent symptoms. Not on maintenance inhalers and rarely uses rescue.  Severe Persistent Asthma - well-controlled No exacerbations in >2 years Xolair discontinued 11/2020 with no recurrent symptoms --REFILL Albuterol --DISCONTINUED Singulair due to concerns for anxiety/depression  Benign pulmonary nodules Stable since 2018. No further imaging warranted  Bradycardia, palpitations - no active symptoms or concerns Followed by Cardiology with Dr. Audie Box  Health Maintenance Immunization History  Administered Date(s) Administered   COVID-19, mRNA, vaccine(Comirnaty)12 years and older  11/17/2021   Influenza Split 11/02/2012, 11/05/2014, 10/04/2015   Influenza Whole 10/13/2007, 11/03/2010, 11/06/2011   Influenza, High Dose Seasonal PF 11/02/2016, 12/05/2018   Influenza-Unspecified 11/02/2013, 11/21/2016   PFIZER Comirnaty(Gray Top)Covid-19 Tri-Sucrose Vaccine 05/30/2020   PFIZER(Purple Top)SARS-COV-2 Vaccination 01/20/2019, 02/10/2019   Pfizer Covid-19 Vaccine Bivalent Booster 32yr & up 11/21/2020, 06/09/2021   Pneumococcal Conjugate-13 11/02/2012   Pneumococcal Polysaccharide-23 02/03/2008, 12/05/2018   Respiratory Syncytial Virus Vaccine,Recomb Aduvanted(Arexvy) 11/24/2021   Zoster Recombinat (Shingrix) 01/05/2019, 03/16/2019   No orders of the defined types were placed in this encounter.  No orders of the defined types were placed in this encounter.   Return in about 1 year (around 03/12/2023).  I have spent a total time of 20-minutes on the day of the appointment including chart review, data review, collecting history, coordinating care and discussing medical diagnosis and plan with the patient/family. Past medical history, allergies, medications were reviewed. Pertinent imaging, labs and tests included in this note have been reviewed and interpreted independently by me.  Jaziah Goeller JRodman Pickle MD [facility] 03/11/2022 12:12 PM  Office Number  [phone]   

## 2022-03-11 NOTE — Patient Instructions (Addendum)
Severe Persistent Asthma - well controlled --REFILL Albuterol  Follow-up in 1 year

## 2022-03-23 ENCOUNTER — Ambulatory Visit (INDEPENDENT_AMBULATORY_CARE_PROVIDER_SITE_OTHER): Payer: Medicare Other | Admitting: Psychology

## 2022-03-23 DIAGNOSIS — F4323 Adjustment disorder with mixed anxiety and depressed mood: Secondary | ICD-10-CM

## 2022-03-23 NOTE — Progress Notes (Signed)
   Date: 03/23/2022  Diagnosis Adj. Disorder with anxiety and depression Symptoms unspecified Medication Status compliance  Safety none  If Suicidal or Homicidal State Action Taken: unspecified  Current Risk: low Medications unspecified Objectives unspecified Client Response full compliance  Service Location Location, [address] [place], [place] [address]  Service Code cpt [phone]  Normalize/Reframe  Facilitate problem solving  Validate/empathize  Distress tolerance skill  Emotion regulation skills  Self care activities  Self-monitoring  Mindfulness training  Session Notes:  Dx: Adjustment Disorder with Anxiety and Depression  Meds: [facility] for asthma, zolair (injection), blood pressure Meds. No psychotropic.  Goals: Patient is looking to reduce symptoms of anxiety and depression. Needs help adjusting to retirement. Motivation and energy is very low. Individual therapy to define stategies to reduce symptoms. Goal date is 12-23. Revised goal date is 12-24  Patient agrees to a video (Webex) session. She is at home and I am in my home office. Gailene cannot eliminate the early morning anxiety. Only relief is when she is traveling. 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 She states that when she wakes up with no plan, she will stay in bed, watching TV ir reading for a "few hours". We talked about the prospect of taking anti-depressant medication. She doesn't  like the idea, but is willing to consider. Wants to think on it and will discuss further.  She is finding "renewed comfort" in prayers and is reconnected to church after over 50 years. She has chronic fears for which praying doesn't help (except as a brief distraction).                                                                  [name], PhD 1:10p-2:00p 50 minutes

## 2022-04-06 ENCOUNTER — Ambulatory Visit (INDEPENDENT_AMBULATORY_CARE_PROVIDER_SITE_OTHER): Payer: Medicare Other | Admitting: Psychology

## 2022-04-06 DIAGNOSIS — F4323 Adjustment disorder with mixed anxiety and depressed mood: Secondary | ICD-10-CM | POA: Diagnosis not present

## 2022-04-06 NOTE — Progress Notes (Signed)
                  Date: 04/06/2022  Diagnosis Adj. Disorder with anxiety and depression Symptoms unspecified Medication Status compliance  Safety none  If Suicidal or Homicidal State Action Taken: unspecified  Current Risk: low Medications unspecified Objectives unspecified Client Response full compliance  Service Location Location, [address] [place], [place] [address]  Service Code cpt [phone]  Normalize/Reframe  Facilitate problem solving  Validate/empathize  Distress tolerance skill  Emotion regulation skills  Self care activities  Self-monitoring  Mindfulness training  Session Notes:  Dx: Adjustment Disorder with Anxiety and Depression  Meds: [place]ast for asthma, zolair (injection), blood pressure Meds. No psychotropic.  Goals: Patient is looking to reduce symptoms of anxiety and depression. Needs help adjusting to retirement. Motivation and energy is very low. Individual therapy to define stategies to reduce symptoms. Goal date is 12-23. Revised goal date is 12-24  Patient agrees to a video (Webex) session. She is at home and I am in my home office. Elaine Luna says she is "overthinking" things. We talked about cognitive strategies to stop negative perseverations. She is also going to church and find that prayer is very helpful.                                                                     Elaine Morel, PhD 1:10p-2:00p 50 minutes

## 2022-04-20 ENCOUNTER — Ambulatory Visit (INDEPENDENT_AMBULATORY_CARE_PROVIDER_SITE_OTHER): Payer: Medicare Other | Admitting: Psychology

## 2022-04-20 DIAGNOSIS — F4323 Adjustment disorder with mixed anxiety and depressed mood: Secondary | ICD-10-CM
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
## 2022-05-04 ENCOUNTER — Ambulatory Visit: Payer: Medicare Other | Admitting: Psychology

## 2022-05-18 ENCOUNTER — Ambulatory Visit: Payer: Medicare Other | Admitting: Psychology

## 2022-05-23 ENCOUNTER — Emergency Department (HOSPITAL_BASED_OUTPATIENT_CLINIC_OR_DEPARTMENT_OTHER): Payer: Medicare Other

## 2022-05-23 ENCOUNTER — Encounter (HOSPITAL_BASED_OUTPATIENT_CLINIC_OR_DEPARTMENT_OTHER): Payer: Self-pay

## 2022-05-23 ENCOUNTER — Other Ambulatory Visit: Payer: Self-pay

## 2022-05-23 ENCOUNTER — Emergency Department (HOSPITAL_BASED_OUTPATIENT_CLINIC_OR_DEPARTMENT_OTHER): Payer: Medicare Other | Admitting: Radiology

## 2022-05-23 ENCOUNTER — Emergency Department (HOSPITAL_BASED_OUTPATIENT_CLINIC_OR_DEPARTMENT_OTHER)
Admission: EM | Admit: 2022-05-23 | Discharge: 2022-05-23 | Disposition: A | Discharge status: Home / Self Care | Payer: Medicare Other | Attending: Emergency Medicine | Admitting: Emergency Medicine

## 2022-05-23 DIAGNOSIS — Z1152 Encounter for screening for COVID-19: Secondary | ICD-10-CM | POA: Diagnosis not present

## 2022-05-23 DIAGNOSIS — E119 Type 2 diabetes mellitus without complications: Secondary | ICD-10-CM | POA: Diagnosis not present

## 2022-05-23 DIAGNOSIS — I10 Essential (primary) hypertension: Secondary | ICD-10-CM | POA: Diagnosis not present

## 2022-05-23 DIAGNOSIS — J45901 Unspecified asthma with (acute) exacerbation: Secondary | ICD-10-CM | POA: Insufficient documentation

## 2022-05-23 DIAGNOSIS — R0602 Shortness of breath: Secondary | ICD-10-CM | POA: Diagnosis present

## 2022-05-23 LAB — BASIC METABOLIC PANEL
Anion gap: 10 (ref 5–15)
BUN: 23 mg/dL (ref 8–23)
CO2: 25 mmol/L (ref 22–32)
Calcium: 9.9 mg/dL (ref 8.9–10.3)
Chloride: 104 mmol/L (ref 98–111)
Creatinine, Ser: 1.29 mg/dL — ABNORMAL HIGH (ref 0.44–1.00)
GFR, Estimated: 42 mL/min — ABNORMAL LOW (ref >60)
Glucose, Bld: 103 mg/dL — ABNORMAL HIGH (ref 70–99)
Potassium: 3.7 mmol/L (ref 3.5–5.1)
Sodium: 139 mmol/L (ref 135–145)

## 2022-05-23 LAB — CBC
HCT: 36.6 % (ref 36.0–46.0)
Hemoglobin: 12.3 g/dL (ref 12.0–15.0)
MCH: 30.9 pg (ref 26.0–34.0)
MCHC: 33.6 g/dL (ref 30.0–36.0)
MCV: 92 fL (ref 80.0–100.0)
Platelets: 266 10*3/uL (ref 150–400)
RBC: 3.98 MIL/uL (ref 3.87–5.11)
RDW: 13.2 % (ref 11.5–15.5)
WBC: 5.5 10*3/uL (ref 4.0–10.5)
nRBC: 0 % (ref 0.0–0.2)

## 2022-05-23 LAB — RESP PANEL BY RT-PCR (RSV, FLU A&B, COVID)  RVPGX2
Influenza A by PCR: NEGATIVE
Influenza B by PCR: NEGATIVE
Resp Syncytial Virus by PCR: NEGATIVE
SARS Coronavirus 2 by RT PCR: NEGATIVE

## 2022-05-23 LAB — BRAIN NATRIURETIC PEPTIDE: B Natriuretic Peptide: 58.2 pg/mL (ref 0.0–100.0)

## 2022-05-23 MED ORDER — METHYLPREDNISOLONE SODIUM SUCC 125 MG IJ SOLR
125.0000 mg | Freq: Once | INTRAMUSCULAR | Status: AC
  Administered 2022-05-23: 125 mg via INTRAVENOUS
  Filled 2022-05-23: qty 2

## 2022-05-23 MED ORDER — AEROCHAMBER PLUS FLO-VU LARGE MISC
1.0000 | Freq: Once | Status: AC
  Administered 2022-05-23: 1
  Filled 2022-05-23: qty 1

## 2022-05-23 MED ORDER — MAGNESIUM SULFATE 2 GM/50ML IV SOLN
2.0000 g | Freq: Once | INTRAVENOUS | Status: AC
  Administered 2022-05-23: 2 g via INTRAVENOUS
  Filled 2022-05-23: qty 50

## 2022-05-23 MED ORDER — ALBUTEROL SULFATE (2.5 MG/3ML) 0.083% IN NEBU
INHALATION_SOLUTION | RESPIRATORY_TRACT | Status: AC
  Administered 2022-05-23: 2.5 mg via RESPIRATORY_TRACT
  Filled 2022-05-23: qty 3

## 2022-05-23 MED ORDER — IPRATROPIUM-ALBUTEROL 0.5-2.5 (3) MG/3ML IN SOLN
3.0000 mL | Freq: Once | RESPIRATORY_TRACT | Status: AC
  Administered 2022-05-23: 3 mL via RESPIRATORY_TRACT
  Filled 2022-05-23: qty 3

## 2022-05-23 MED ORDER — ALBUTEROL SULFATE (5 MG/ML) 0.5% IN NEBU: 2.5000 mg | INHALATION_SOLUTION | Freq: Four times a day (QID) | RESPIRATORY_TRACT | 12 refills | Status: DC | PRN

## 2022-05-23 MED ORDER — IPRATROPIUM-ALBUTEROL 0.5-2.5 (3) MG/3ML IN SOLN: 3.0000 mL | Freq: Once | RESPIRATORY_TRACT | Status: AC

## 2022-05-23 MED ORDER — ALBUTEROL SULFATE (2.5 MG/3ML) 0.083% IN NEBU: 2.5000 mg | INHALATION_SOLUTION | Freq: Once | RESPIRATORY_TRACT | Status: AC

## 2022-05-23 MED ORDER — ALBUTEROL SULFATE HFA 108 (90 BASE) MCG/ACT IN AERS
2.0000 | INHALATION_SPRAY | RESPIRATORY_TRACT | Status: DC | PRN
  Administered 2022-05-23: 2 via RESPIRATORY_TRACT
  Filled 2022-05-23: qty 6.7

## 2022-05-23 MED ORDER — IPRATROPIUM-ALBUTEROL 0.5-2.5 (3) MG/3ML IN SOLN
RESPIRATORY_TRACT | Status: AC
  Administered 2022-05-23: 3 mL via RESPIRATORY_TRACT
  Filled 2022-05-23: qty 3

## 2022-05-23 NOTE — ED Provider Notes (Signed)
[place] EMERGENCY DEPARTMENT AT [facility] Provider Note   CSN: [id number] Arrival date & time: 05/23/22  2126     History  Chief Complaint  Patient presents with   Shortness of Breath    Elaine Luna is a 77 y.o. female with history of asthma, hyperlipidemia, hypertension, diabetes, PE associated with COVID-19 infection 2020, who presents the emergency department complaining of shortness of breath with concern for asthma exacerbation.  Patient states that she was taken off one of her asthma medications by her pulmonary doctor 2 months ago because she was feeling improved.  For the past 3 to 4 days she has had worsening shortness of breath, no relief with home rescue inhaler.  She ran out of this.  No fever.  Dry cough.   Shortness of Breath Associated symptoms: cough and wheezing   Associated symptoms: no fever        Home Medications Prior to Admission medications   Medication Sig Start Date End Date Taking? Authorizing Provider  albuterol (PROVENTIL) (5 MG/ML) 0.5% nebulizer solution Take 0.5 mLs (2.5 mg total) by nebulization every 6 (six) hours as needed for wheezing or shortness of breath. 05/23/22  Yes Rithvik Orcutt T, PA-C  albuterol (VENTOLIN HFA) 108 (90 Base) MCG/ACT inhaler Inhale 2 puffs into the lungs every 6 (six) hours as needed. 03/11/22     atorvastatin (LIPITOR) 20 MG tablet Take 1 tablet (20 mg total) by mouth daily. 02/20/22     Denosumab (PROLIA [place]) Inject into the skin every 6 (six) months.    [provider]  irbesartan (AVAPRO) 300 MG tablet TAKE 1/2 TABLET BY MOUTH ONCE DAILY 02/20/22     pantoprazole (PROTONIX) 20 MG tablet Take 1 tablet (20 mg total) by mouth daily. 02/20/22     Vitamin D, Ergocalciferol, (DRISDOL) 1.25 MG (50000 UNIT) CAPS capsule Take 1 capsule (50,000 Units total) by mouth 2 (two) times a week. 10/10/20         Allergies    Ace inhibitors    Review of Systems   Review of Systems  Constitutional:  Negative  for fever.  Respiratory:  Positive for cough, chest tightness, shortness of breath and wheezing.   All other systems reviewed and are negative.   Physical Exam Updated Vital Signs BP (!) 151/87   Pulse 78   Temp 98.1 F (36.7 C) (Oral)   Resp 13   Ht  (1.626 m)   Wt 61.2 kg   SpO2 94%   BMI 23.17 kg/m  Physical Exam Vitals and nursing note reviewed.  Constitutional:      Appearance: Normal appearance.  HENT:     Head: Normocephalic and atraumatic.  Eyes:     Conjunctiva/sclera: Conjunctivae normal.  Cardiovascular:     Rate and Rhythm: Normal rate and regular rhythm.  Pulmonary:     Effort: Pulmonary effort is normal. No respiratory distress.     Breath sounds: Normal breath sounds.     Comments: Decreased air movement in all lung fields, with significant inspiratory and expiratory wheezing, no obvious consolidation, difficult lung exam due to coughing  Abdominal:     General: There is no distension.     Palpations: Abdomen is soft.     Tenderness: There is no abdominal tenderness.  Musculoskeletal:     Right lower leg: No edema.     Left lower leg: No edema.  Skin:    General: Skin is warm and dry.  Neurological:  General: No focal deficit present.     Mental Status: She is alert.     ED Results / Procedures / Treatments   Labs (all labs ordered are listed, but only abnormal results are displayed) Labs Reviewed  BASIC METABOLIC PANEL - Abnormal; Notable for the following components:      Result Value   Glucose, Bld 103 (*)    Creatinine, Ser 1.29 (*)    GFR, Estimated 42 (*)    All other components within normal limits  RESP PANEL BY RT-PCR (RSV, FLU A&B, COVID)  RVPGX2  CBC  BRAIN NATRIURETIC PEPTIDE    EKG EKG Interpretation  Date/Time:  Saturday May 23 2022 21:50:47 EDT Ventricular Rate:  63 PR Interval:  50 QRS Duration: 82 QT Interval:  410 QTC Calculation: 420 R Axis:   26 Text Interpretation: Sinus rhythm Short PR interval  Borderline repolarization abnormality Confirmed by Alona Bene [id number]) on 05/23/2022 9:57:09 PM  Radiology DG Chest Portable 1 View  Result Date: 05/23/2022 CLINICAL DATA:  Shortness of breath x3 days. EXAM: PORTABLE CHEST 1 VIEW COMPARISON:  May 18, 2020 FINDINGS: The heart size and mediastinal contours are within normal limits. Mild, diffuse, chronic appearing increased interstitial lung markings are noted. There is no evidence of an acute infiltrate, pleural effusion or pneumothorax. There is mild dextroscoliosis of the lower thoracic spine. IMPRESSION: Chronic appearing increased interstitial lung markings without evidence of acute or active cardiopulmonary disease. Electronically Signed   By: Aram Candela M.D.   On: 05/23/2022 22:33    Procedures Procedures    Medications Ordered in ED Medications  albuterol (VENTOLIN HFA) 108 (90 Base) MCG/ACT inhaler 2 puff (2 puffs Inhalation Given 05/23/22 2214)  ipratropium-albuterol (DUONEB) 0.5-2.5 (3) MG/3ML nebulizer solution 3 mL (has no administration in time range)  ipratropium-albuterol (DUONEB) 0.5-2.5 (3) MG/3ML nebulizer solution 3 mL (3 mLs Nebulization Given 05/23/22 2151)  albuterol (PROVENTIL) (2.5 MG/3ML) 0.083% nebulizer solution 2.5 mg (2.5 mg Nebulization Given 05/23/22 2151)  AeroChamber Plus Flo-Vu Large MISC 1 each (1 each Other Given 05/23/22 2214)  methylPREDNISolone sodium succinate (SOLU-MEDROL) 125 mg/2 mL injection 125 mg (125 mg Intravenous Given 05/23/22 2225)  magnesium sulfate IVPB 2 g 50 mL (0 g Intravenous Stopped 05/23/22 2312)    ED Course/ Medical Decision Making/ A&P Clinical Course as of 05/23/22 2339  Sat May 23, 2022  2330 On reevaluation, patient with significantly improved air movement, still some expiratory wheezing. Will do one more duoneb breathing tx then plan to dc [LR]    Clinical Course User Index [LR] Tazia Illescas, Lora Paula, PA-C                             Medical Decision Making Amount  and/or Complexity of Data Reviewed Labs: ordered. Radiology: ordered.  Risk Prescription drug management.   This patient is a 77 y.o. female  who presents to the ED for concern of shortness of breath.   Differential diagnoses prior to evaluation: The emergent differential diagnosis includes, but is not limited to,  CHF, pericardial effusion/tamponade, arrhythmias, ACS, COPD, asthma, bronchitis, pneumonia, pneumothorax, PE, anemia . This is not an exhaustive differential.   Past Medical History / Co-morbidities: asthma, hyperlipidemia, hypertension, diabetes, PE associated with COVID-19 infection in 2020  Additional history: Chart reviewed. Pertinent results include: Patient taken off Xolair by pulmonologist on February 7 as she had had no exacerbations in 2 years  Physical Exam: Physical exam performed. The pertinent  findings include: Initially quite hypertensive, decreased air movement in all lung fields, inspiratory and expiratory wheezing.  No peripheral edema noted.  Lab Tests/Imaging studies: I personally interpreted labs/imaging and the pertinent results include: No leukocytosis, normal hemoglobin.  BMP grossly at baseline.  BNP normal.  Negative respiratory panel.  Chest x-ray with chronic appearing interstitial lung markings, no evidence of acute disease.  I agree with the radiologist interpretation.  Cardiac monitoring: EKG obtained and interpreted by my attending physician which shows: Sinus rhythm with borderline repolarization abnormality   Medications: I ordered medication including breathing treatments, Solu-Medrol, magnesium.  I have reviewed the patients home medicines and have made adjustments as needed.  On reevaluation patient's exam is significantly improved.  Improved air movement, still some expiratory wheezing.  Plan to do 1 more DuoNeb and discharged home.   Disposition: After consideration of the diagnostic results and the patients response to treatment, I  feel that emergency department workup does not suggest an emergent condition requiring admission or immediate intervention beyond what has been performed at this time. The plan is: Discharged to home with refill of rescue inhaler and albuterol nebulizer.  Symptoms consistent with asthma exacerbation.  Maintaining normal saturations on room air.  Much improved after treatments given in the ER.  Not requiring admission.  Advised to follow-up with pulmonologist next week.. The patient is safe for discharge and has been instructed to return immediately for worsening symptoms, change in symptoms or any other concerns.  Final Clinical Impression(s) / ED Diagnoses Final diagnoses:  Moderate asthma with exacerbation, unspecified whether persistent    Rx / DC Orders ED Discharge Orders          Ordered    albuterol (PROVENTIL) (5 MG/ML) 0.5% nebulizer solution  Every 6 hours PRN        05/23/22 2329           Portions of this report may have been transcribed using voice recognition software. Every effort was made to ensure accuracy; however, inadvertent computerized transcription errors may be present.    Jeanella Flattery 05/23/22 2340    Maia Plan, MD 05/24/22 1128

## 2022-05-23 NOTE — Discharge Instructions (Addendum)
You were seen in the ER for shortness of breath.  We have given you medication for an asthma exacerbation and I'm glad you're feeling improved. Your workup was reassuring today.  I have sent the nebulizer solution to the pharmacy.  Please call your pulmonary doctor first thing on Monday for follow up.   Continue to monitor how you're doing and return to the ER for new or worsening symptoms.

## 2022-05-23 NOTE — ED Triage Notes (Signed)
Shortness of breath for ~3 days. Hx of asthma.

## 2022-05-23 NOTE — ED Notes (Signed)
RT educated pt on proper use of MDI w/spacer. Pt able to perform w/out difficulty. Pt respiratory status stable w/BLBS post tx clr/dim w/no distress noted. RT will continue to monitor while at [facility] ED.

## 2022-05-27 ENCOUNTER — Telehealth: Payer: Self-pay

## 2022-05-27 NOTE — Telephone Encounter (Signed)
     Patient  visit on 05/23/2022  at [facility] was for shortness of breath.  Have you been able to follow up with your primary care physician? Patient has appointment with Pulmonologist  The patient was or was not able to obtain any needed medicine or equipment. Patient stated that she would follow-up with her Pulmonologist since the pharmacy was unable to fill.  Are there diet recommendations that you are having difficulty following? No  Patient expresses understanding of discharge instructions and education provided has no other needs at this time. Yes   Landrey Mahurin [facility]  [facility] Population [facility] Community Resource Care Guide   [email or web address]  ?? [phone]   Website: [email or web address]

## 2022-05-29 ENCOUNTER — Encounter (HOSPITAL_BASED_OUTPATIENT_CLINIC_OR_DEPARTMENT_OTHER): Payer: Self-pay | Admitting: Pulmonary Disease

## 2022-05-29 ENCOUNTER — Other Ambulatory Visit (HOSPITAL_BASED_OUTPATIENT_CLINIC_OR_DEPARTMENT_OTHER): Payer: Self-pay

## 2022-05-29 ENCOUNTER — Ambulatory Visit (INDEPENDENT_AMBULATORY_CARE_PROVIDER_SITE_OTHER): Payer: Medicare Other | Admitting: Pulmonary Disease

## 2022-05-29 VITALS — BP 152/80 | HR 55 | Temp 98.9°F | Ht 65.0 in | Wt 138.6 lb

## 2022-05-29 DIAGNOSIS — J4551 Severe persistent asthma with (acute) exacerbation: Secondary | ICD-10-CM

## 2022-05-29 MED ORDER — ALBUTEROL SULFATE HFA 108 (90 BASE) MCG/ACT IN AERS
2.0000 | INHALATION_SPRAY | Freq: Four times a day (QID) | RESPIRATORY_TRACT | 3 refills | Status: DC | PRN
  Filled 2022-05-29: qty 6.7, 28d supply, fill #0
  Filled 2023-01-18 (×2): qty 6.7, 28d supply, fill #1

## 2022-05-29 MED ORDER — BUDESONIDE-FORMOTEROL FUMARATE 80-4.5 MCG/ACT IN AERO
2.0000 | INHALATION_SPRAY | Freq: Two times a day (BID) | RESPIRATORY_TRACT | 1 refills | Status: DC
  Filled 2022-05-29: qty 10.2, 30d supply, fill #0
  Filled 2023-02-15 – 2023-02-26 (×2): qty 10.2, 30d supply, fill #1

## 2022-05-29 NOTE — Progress Notes (Signed)
Subjective:   PATIENT ID: Elaine Luna GENDER: female DOB: February 04, 1945, MRN: [id number]   HPI  Chief Complaint  Patient presents with   Follow-up    Follow up.    Reason for Visit: Follow-up   Ms. Dru Laurel is a 77 year old female with severe persistent asthma, hx of COVID-19 10/2018, hx bilateral segmental pulmonary emboli s/p anticoagulation x 3 months who presents for follow-up.  11/25/20 She reports her asthma is well-controlled on Xolair and Qvair 1-2 times a day. Not on Dulera. Has not had an asthma exacerbation in 2 years. Denies cough, wheezing or shortness of breath. She is enrolled in [employer] and does two zumba classes a day five days a week.   02/07/21 Since discontinuing Xolair she reports her symptoms are well controlled. Qvar one puff twice day. Not needing albuterol inhaler. She is active with [employer] and going to zumba daily. Over the holiday she had a flu like illness with sore throat but no respiratory symptoms.  03/11/22 Since our last visit she denies shortness of breath, cough or wheezing. Rarely uses her albuterol inhaler. Has been trying to apply for volunteering but not accepted. Wanting to be more involved in the community.   05/29/22 Since our last visit she went to a river cruise and returned on 05/23/22. But had developed productive cough chest tightness and wheezing. She had presented to ED and treated with albuterol. No prednisone was given. She is improving but still persistent symptoms.  Social Hx Her first son passed from suicide. Her second son recently passed away from COVID in Jun 19, 2020.  Past Medical History:  Diagnosis Date   Asthma    Colon polyps    DM type 2 (diabetes mellitus, type 2) (HCC) 2013-06-19    Hyperlipidemia    Hypertension    Obesity    Pulmonary embolism associated with COVID-19 (HCC) 12/25/2018   Tubular adenoma     Outpatient Medications Prior to Visit  Medication Sig Dispense Refill   albuterol  (PROVENTIL) (5 MG/ML) 0.5% nebulizer solution Take 0.5 mLs (2.5 mg total) by nebulization every 6 (six) hours as needed for wheezing or shortness of breath. 20 mL 12   atorvastatin (LIPITOR) 20 MG tablet Take 1 tablet (20 mg total) by mouth daily. 90 tablet 3   Denosumab (PROLIA [place]) Inject into the skin every 6 (six) months.     irbesartan (AVAPRO) 300 MG tablet TAKE 1/2 TABLET BY MOUTH ONCE DAILY 45 tablet 3   pantoprazole (PROTONIX) 20 MG tablet Take 1 tablet (20 mg total) by mouth daily. 90 tablet 3   Vitamin D, Ergocalciferol, (DRISDOL) 1.25 MG (50000 UNIT) CAPS capsule Take 1 capsule (50,000 Units total) by mouth 2 (two) times a week. 24 capsule 3   albuterol (VENTOLIN HFA) 108 (90 Base) MCG/ACT inhaler Inhale 2 puffs into the lungs every 6 (six) hours as needed. 6.7 g 1   No facility-administered medications prior to visit.    Review of Systems  Constitutional:  Negative for chills, diaphoresis, fever, malaise/fatigue and weight loss.  HENT:  Negative for congestion.   Respiratory:  Positive for cough, sputum production, shortness of breath and wheezing. Negative for hemoptysis.   Cardiovascular:  Negative for chest pain, palpitations and leg swelling.     Objective:   Vitals:   05/29/22 1153  BP: (!) 152/80  Pulse: (!) 55  Temp: 98.9 F (37.2 C)  TempSrc: Oral  SpO2: 97%  Weight: 138 lb 9.6 oz (62.9  kg)  Height: 5\' 5"  (1.651 m)   SpO2: 97 % O2 Device: None (Room air)  Physical Exam: General: Well-appearing, no acute distress HENT: [place], AT Eyes: EOMI, no scleral icterus Respiratory: Diminished air entry at bases. Expiratory wheeze Cardiovascular: RRR, -M/R/G, no JVD Extremities:-Edema,-tenderness Neuro: AAO x4, CNII-XII grossly intact Psych: Normal mood, normal affect  Data Reviewed:  Imaging: HRCT 04/02/16 - air trapping present on expiration, mild cylindrical bronchiectasis and scattered sub-centimeter pulmonary nodules < 4 mm that are unchanged  CTA 11/14/18 -  several bilateral tiny pulmonary emboli in segmental branches of RUL, RLL, lingula and LLL. Stable bilateral pulmonary nodules  CXR 05/18/20 - hyperinflated  PFT: None on file    Assessment & Plan:   Discussion: 77 year old female with severe persistent asthma, hx aspergillus IgE antibody in 2017, hx PE in setting of covid in 2019 and hx benign pulmonary nodules who present for follow-up. Off Xolair since 11/2020 with no recurrent symptoms until this month. Suspect viral exacerbation. Unclear if she continues to need maintenance inhalers.  Severe Persistent Asthma in exacerbation No exacerbations in >2 years until 05/2022 Xolair discontinued 11/2020 with no recurrent symptoms --START Symbicort 160-4.5 mcg TWO puffs in the morning and evening. Rinse mouth out after use  >OK to reduce use once symptoms improved.  --REFILL Albuterol --DISCONTINUED Singulair due to concerns for anxiety/depression  Benign pulmonary nodules Stable since 2018. No further imaging warranted  Bradycardia, palpitations - no active symptoms or concerns Followed by Cardiology with Dr. Flora Lipps  Health Maintenance Immunization History  Administered Date(s) Administered   COVID-19, mRNA, vaccine(Comirnaty)12 years and older 11/17/2021   Influenza Split 11/02/2012, 11/05/2014, 10/04/2015   Influenza Whole 10/13/2007, 11/03/2010, 11/06/2011   Influenza, High Dose Seasonal PF 11/02/2016, 12/05/2018   Influenza-Unspecified 11/02/2013, 11/21/2016   PFIZER Comirnaty(Gray Top)Covid-19 Tri-Sucrose Vaccine 05/30/2020   PFIZER(Purple Top)SARS-COV-2 Vaccination 01/20/2019, 02/10/2019   Pfizer Covid-19 Vaccine Bivalent Booster 58yrs & up 11/21/2020, 06/09/2021   Pneumococcal Conjugate-13 11/02/2012   Pneumococcal Polysaccharide-23 02/03/2008, 12/05/2018   Respiratory Syncytial Virus Vaccine,Recomb Aduvanted(Arexvy) 11/24/2021   Zoster Recombinat (Shingrix) 01/05/2019, 03/16/2019   No orders of the defined types were  placed in this encounter.  Meds ordered this encounter  Medications   budesonide-formoterol (SYMBICORT) 80-4.5 MCG/ACT inhaler    Sig: Inhale 2 puffs into the lungs 2 (two) times daily.    Dispense:  10.2 g    Refill:  1   albuterol (VENTOLIN HFA) 108 (90 Base) MCG/ACT inhaler    Sig: Inhale 2 puffs into the lungs every 6 (six) hours as needed.    Dispense:  6.7 g    Refill:  3    Return if symptoms worsen or fail to improve.  I have spent a total time of 20-minutes on the day of the appointment including chart review, data review, collecting history, coordinating care and discussing medical diagnosis and plan with the patient/family. Past medical history, allergies, medications were reviewed. Pertinent imaging, labs and tests included in this note have been reviewed and interpreted independently by me.  Leontine Radman Mechele Collin, MD [facility] 05/29/2022 5:24 PM  Office Number [phone]

## 2022-05-29 NOTE — Patient Instructions (Addendum)
Severe Persistent Asthma in exacerbation No exacerbations in >2 years until 05/2022 Xolair discontinued 11/2020 with no recurrent symptoms --START Symbicort 160-4.5 mcg TWO puffs in the morning and evening. Rinse mouth out after use  >OK to reduce use once symptoms improved.  --REFILL Albuterol  Follow-up as needed unless your symptoms are persistent

## 2022-06-01 ENCOUNTER — Ambulatory Visit: Payer: Medicare Other | Admitting: Psychology

## 2022-06-10 ENCOUNTER — Encounter (HOSPITAL_BASED_OUTPATIENT_CLINIC_OR_DEPARTMENT_OTHER): Payer: Self-pay | Admitting: Emergency Medicine

## 2022-06-10 ENCOUNTER — Other Ambulatory Visit: Payer: Self-pay

## 2022-06-10 ENCOUNTER — Other Ambulatory Visit (HOSPITAL_BASED_OUTPATIENT_CLINIC_OR_DEPARTMENT_OTHER): Payer: Self-pay

## 2022-06-10 ENCOUNTER — Emergency Department (HOSPITAL_BASED_OUTPATIENT_CLINIC_OR_DEPARTMENT_OTHER)
Admission: EM | Admit: 2022-06-10 | Discharge: 2022-06-10 | Disposition: A | Discharge status: Home / Self Care | Payer: Medicare Other | Attending: Emergency Medicine | Admitting: Emergency Medicine

## 2022-06-10 DIAGNOSIS — I10 Essential (primary) hypertension: Secondary | ICD-10-CM | POA: Diagnosis present

## 2022-06-10 DIAGNOSIS — I160 Hypertensive urgency: Secondary | ICD-10-CM | POA: Insufficient documentation

## 2022-06-10 LAB — CBC WITH DIFFERENTIAL/PLATELET
Abs Immature Granulocytes: 0.01 10*3/uL (ref 0.00–0.07)
Basophils Absolute: 0.1 10*3/uL (ref 0.0–0.1)
Basophils Relative: 1 %
Eosinophils Absolute: 0.2 10*3/uL (ref 0.0–0.5)
Eosinophils Relative: 3 %
HCT: 40.3 % (ref 36.0–46.0)
Hemoglobin: 13.7 g/dL (ref 12.0–15.0)
Immature Granulocytes: 0 %
Lymphocytes Relative: 20 %
Lymphs Abs: 1.5 10*3/uL (ref 0.7–4.0)
MCH: 31.1 pg (ref 26.0–34.0)
MCHC: 34 g/dL (ref 30.0–36.0)
MCV: 91.6 fL (ref 80.0–100.0)
Monocytes Absolute: 0.4 10*3/uL (ref 0.1–1.0)
Monocytes Relative: 6 %
Neutro Abs: 5.1 10*3/uL (ref 1.7–7.7)
Neutrophils Relative %: 70 %
Platelets: 317 10*3/uL (ref 150–400)
RBC: 4.4 MIL/uL (ref 3.87–5.11)
RDW: 12.9 % (ref 11.5–15.5)
WBC: 7.3 10*3/uL (ref 4.0–10.5)
nRBC: 0 % (ref 0.0–0.2)

## 2022-06-10 LAB — COMPREHENSIVE METABOLIC PANEL
ALT: 11 U/L (ref 0–44)
AST: 16 U/L (ref 15–41)
Albumin: 4.2 g/dL (ref 3.5–5.0)
Alkaline Phosphatase: 54 U/L (ref 38–126)
Anion gap: 3 — ABNORMAL LOW (ref 5–15)
BUN: 19 mg/dL (ref 8–23)
CO2: 32 mmol/L (ref 22–32)
Calcium: 9.2 mg/dL (ref 8.9–10.3)
Chloride: 103 mmol/L (ref 98–111)
Creatinine, Ser: 1.29 mg/dL — ABNORMAL HIGH (ref 0.44–1.00)
GFR, Estimated: 42 mL/min — ABNORMAL LOW (ref >60)
Glucose, Bld: 116 mg/dL — ABNORMAL HIGH (ref 70–99)
Potassium: 4.3 mmol/L (ref 3.5–5.1)
Sodium: 138 mmol/L (ref 135–145)
Total Bilirubin: 1.5 mg/dL — ABNORMAL HIGH (ref 0.3–1.2)
Total Protein: 6.9 g/dL (ref 6.5–8.1)

## 2022-06-10 LAB — URINALYSIS, ROUTINE W REFLEX MICROSCOPIC
Bilirubin Urine: NEGATIVE
Glucose, UA: NEGATIVE mg/dL
Hgb urine dipstick: NEGATIVE
Ketones, ur: NEGATIVE mg/dL
Nitrite: NEGATIVE
Protein, ur: 30 mg/dL — AB
Specific Gravity, Urine: 1.011 (ref 1.005–1.030)
Trans Epithel, UA: 1
pH: 7 (ref 5.0–8.0)

## 2022-06-10 MED ORDER — HYDRALAZINE HCL 20 MG/ML IJ SOLN
10.0000 mg | Freq: Once | INTRAMUSCULAR | Status: AC
  Administered 2022-06-10: 10 mg via INTRAVENOUS
  Filled 2022-06-10: qty 1

## 2022-06-10 MED ORDER — HYDRALAZINE HCL 10 MG PO TABS
10.0000 mg | ORAL_TABLET | Freq: Four times a day (QID) | ORAL | 0 refills | Status: DC | PRN
  Filled 2022-06-10: qty 30, 8d supply, fill #0

## 2022-06-10 NOTE — ED Notes (Signed)
Pt verbalized understanding of d/c instructions, meds, and followup care. Denies questions. VSS, no distress noted. Steady gait to exit with all belongings.  ?

## 2022-06-10 NOTE — ED Triage Notes (Signed)
Pt arrives to ED with c/o HTN. She first noticed at dental appointment today. Systolic 250s.

## 2022-06-10 NOTE — ED Provider Notes (Signed)
[place] EMERGENCY DEPARTMENT AT [facility] Provider Note   CSN: [id number] Arrival date & time: 06/10/22  1111     History  Chief Complaint  Patient presents with   Hypertension    Elaine Luna is a 77 y.o. female.  HPI Patient reports her blood pressures are elevated.  She went to the dentist today and they measured it 4 times to confirm that her systolic was greater than 220.  Patient ports she has no associated symptoms.  She reports she feels fine.  She reports that she stopped taking her Avapro several days ago because she was starting to notice feeling lightheaded when she stood up and thought that maybe her blood pressure was getting too low.  However, she had not measured it at home to know if it was actually getting low.  She was not having any other symptoms of chest pain, shortness of breath or swelling of the legs.  Patient reports while she was on the Avapro blood pressure was typically controlled with higher pressures being in the 150s over 80s.    Home Medications Prior to Admission medications   Medication Sig Start Date End Date Taking? Authorizing Provider  hydrALAZINE (APRESOLINE) 10 MG tablet Take 1 tablet (10 mg total) by mouth every 6 (six) hours as needed. As you are restarting your Avapro, you may take hydralazine 10 mg every 6 hours as needed for blood pressure that remains greater than 180 top number or 90 bottom number 06/10/22  Yes Roma Bierlein, Lebron Conners, MD  albuterol (PROVENTIL) (5 MG/ML) 0.5% nebulizer solution Take 0.5 mLs (2.5 mg total) by nebulization every 6 (six) hours as needed for wheezing or shortness of breath. 05/23/22   Roemhildt, Lorin T, PA-C  albuterol (VENTOLIN HFA) 108 (90 Base) MCG/ACT inhaler Inhale 2 puffs into the lungs every 6 (six) hours as needed. 05/29/22   Luciano Cutter, MD  atorvastatin (LIPITOR) 20 MG tablet Take 1 tablet (20 mg total) by mouth daily. 02/20/22     budesonide-formoterol (SYMBICORT) 80-4.5 MCG/ACT inhaler  Inhale 2 puffs into the lungs 2 (two) times daily. 05/29/22   Luciano Cutter, MD  Denosumab (PROLIA [place]) Inject into the skin every 6 (six) months.    [provider]  irbesartan (AVAPRO) 300 MG tablet TAKE 1/2 TABLET BY MOUTH ONCE DAILY 02/20/22     pantoprazole (PROTONIX) 20 MG tablet Take 1 tablet (20 mg total) by mouth daily. 02/20/22     Vitamin D, Ergocalciferol, (DRISDOL) 1.25 MG (50000 UNIT) CAPS capsule Take 1 capsule (50,000 Units total) by mouth 2 (two) times a week. 10/10/20         Allergies    Ace inhibitors    Review of Systems   Review of Systems  Physical Exam Updated Vital Signs BP (!) 237/63   Pulse (!) 50   Temp 98.2 F (36.8 C) (Oral)   Resp 17   Ht 5\' 5"  (1.651 m)   Wt 63.5 kg   SpO2 100%   BMI 23.30 kg/m  Physical Exam Constitutional:      Comments: Patient is well in appearance.  Alert nontoxic.  Well-nourished well-developed.  HENT:     Mouth/Throat:     Pharynx: Oropharynx is clear.  Eyes:     Extraocular Movements: Extraocular movements intact.     Pupils: Pupils are equal, round, and reactive to light.  Cardiovascular:     Rate and Rhythm: Normal rate and regular rhythm.  Pulmonary:     Effort:  Pulmonary effort is normal.     Breath sounds: Normal breath sounds.  Abdominal:     General: There is no distension.     Palpations: Abdomen is soft.     Tenderness: There is no abdominal tenderness. There is no guarding.  Musculoskeletal:        General: No swelling or tenderness. Normal range of motion.     Cervical back: Neck supple.     Right lower leg: No edema.     Left lower leg: No edema.  Skin:    General: Skin is warm and dry.  Neurological:     General: No focal deficit present.     Mental Status: She is oriented to person, place, and time.     Motor: No weakness.     Coordination: Coordination normal.  Psychiatric:        Mood and Affect: Mood normal.     ED Results / Procedures / Treatments   Labs (all labs ordered  are listed, but only abnormal results are displayed) Labs Reviewed  COMPREHENSIVE METABOLIC PANEL - Abnormal; Notable for the following components:      Result Value   Glucose, Bld 116 (*)    Creatinine, Ser 1.29 (*)    Total Bilirubin 1.5 (*)    GFR, Estimated 42 (*)    Anion gap 3 (*)    All other components within normal limits  URINALYSIS, ROUTINE W REFLEX MICROSCOPIC - Abnormal; Notable for the following components:   APPearance HAZY (*)    Protein, ur 30 (*)    Leukocytes,Ua LARGE (*)    Bacteria, UA RARE (*)    All other components within normal limits  CBC WITH DIFFERENTIAL/PLATELET    EKG EKG Interpretation  Date/Time:  Wednesday Jun 10 2022 14:03:03 EDT Ventricular Rate:  53 PR Interval:  160 QRS Duration: 73 QT Interval:  445 QTC Calculation: 418 R Axis:   35 Text Interpretation: Sinus rhythm Consider anterior infarct Borderline repolarization abnormality no sig change from previous Confirmed by Arby Barrette [phone]) on 06/10/2022 2:23:46 PM  Radiology No results found.  Procedures Procedures    Medications Ordered in ED Medications  hydrALAZINE (APRESOLINE) injection 10 mg (10 mg Intravenous Given 06/10/22 1340)    ED Course/ Medical Decision Making/ A&P                             Medical Decision Making Amount and/or Complexity of Data Reviewed Labs: ordered.  Risk Prescription drug management.  Patient presents with significant elevation in blood pressure.  Blood pressures up to 240s/80.  Patient is asymptomatic.  She does not endorse any signs of endorgan damage and has clear mental status.  Patient had discontinued her blood pressure medications due to episodic lightheadedness that has an orthostatic quality to it.  She describes taking her blood pressure medication more periodically over the past number of weeks due to perception of possible low blood pressure but not having confirmed the numbers.  No Avapro for at least the past 2 days.  Appears  most consistent rebound hypertension.  Given the high value numbers of 256 systolic and 241 systolic, will opt to treat in the emergency department.   Recheck 14: 17 patient has had hydralazine 10 mg IV and blood pressure down to 198/65.  Patient remains asymptomatic.  EKG is unchanged.  No signs of ischemic change.  At this time with blood pressures trending down to less than  200 systolic and patient remaining stable with no symptoms, will plan for discharge.  We have reviewed a plan of patient restarting her Avapro soon as she gets home and I have prescribed for hydralazine 10 mg to take on an as-needed basis for severe hypertension until the Avapro is effective again.  Return precautions reviewed.  Patient is to contact her PCP ASAP for blood pressure monitoring on outpatient basis.        Final Clinical Impression(s) / ED Diagnoses Final diagnoses:  Hypertensive urgency    Rx / DC Orders ED Discharge Orders          Ordered    hydrALAZINE (APRESOLINE) 10 MG tablet  Every 6 hours PRN        06/10/22 1420              Arby Barrette, MD 06/10/22 1430

## 2022-06-10 NOTE — ED Notes (Signed)
First contact with patient. Pt arrived via triage from home with c/o HTN x today. Pt. denies shob, is A&OX 4, resp. even/unlabored. Pt placed on cardiac monitor, call light within reach. Patient updated on plan of care. Will continue to monitor patient.

## 2022-06-10 NOTE — Discharge Instructions (Signed)
1.  Your blood pressure likely went up due to discontinue your medication.  Resume your Avapro when you get home today.  Continue to take as previously prescribed. 2.  Measure your blood pressure 3 times a day in a relaxed situation.  If your systolic blood pressure (top number) remains greater than 180, take a dose of hydralazine up to every 6 hours.  While  you are taking the hydralazine, monitor your blood pressures more frequently. 3.  Return to the emergency department immediately if you develop a headache, confusion, visual problems, chest pain, shortness of breath or other concerning changes.  Call your doctors office today to schedule a recheck within the next couple of days to help guide you with your blood pressure management.

## 2022-06-13 ENCOUNTER — Emergency Department (HOSPITAL_BASED_OUTPATIENT_CLINIC_OR_DEPARTMENT_OTHER)
Admission: EM | Admit: 2022-06-13 | Discharge: 2022-06-13 | Disposition: A | Discharge status: Home / Self Care | Payer: Medicare Other | Attending: Emergency Medicine | Admitting: Emergency Medicine

## 2022-06-13 ENCOUNTER — Encounter (HOSPITAL_BASED_OUTPATIENT_CLINIC_OR_DEPARTMENT_OTHER): Payer: Self-pay | Admitting: Emergency Medicine

## 2022-06-13 ENCOUNTER — Emergency Department (HOSPITAL_BASED_OUTPATIENT_CLINIC_OR_DEPARTMENT_OTHER): Payer: Medicare Other | Admitting: Radiology

## 2022-06-13 DIAGNOSIS — Z79899 Other long term (current) drug therapy: Secondary | ICD-10-CM | POA: Insufficient documentation

## 2022-06-13 DIAGNOSIS — E119 Type 2 diabetes mellitus without complications: Secondary | ICD-10-CM | POA: Insufficient documentation

## 2022-06-13 DIAGNOSIS — I10 Essential (primary) hypertension: Secondary | ICD-10-CM | POA: Insufficient documentation

## 2022-06-13 LAB — COMPREHENSIVE METABOLIC PANEL
ALT: 11 U/L (ref 0–44)
AST: 13 U/L — ABNORMAL LOW (ref 15–41)
Albumin: 4.2 g/dL (ref 3.5–5.0)
Alkaline Phosphatase: 63 U/L (ref 38–126)
Anion gap: 10 (ref 5–15)
BUN: 21 mg/dL (ref 8–23)
CO2: 25 mmol/L (ref 22–32)
Calcium: 9.6 mg/dL (ref 8.9–10.3)
Chloride: 105 mmol/L (ref 98–111)
Creatinine, Ser: 1.22 mg/dL — ABNORMAL HIGH (ref 0.44–1.00)
GFR, Estimated: 45 mL/min — ABNORMAL LOW (ref >60)
Glucose, Bld: 113 mg/dL — ABNORMAL HIGH (ref 70–99)
Potassium: 3.9 mmol/L (ref 3.5–5.1)
Sodium: 140 mmol/L (ref 135–145)
Total Bilirubin: 1 mg/dL (ref 0.3–1.2)
Total Protein: 6.8 g/dL (ref 6.5–8.1)

## 2022-06-13 LAB — URINALYSIS, ROUTINE W REFLEX MICROSCOPIC
Bacteria, UA: NONE SEEN
Bilirubin Urine: NEGATIVE
Glucose, UA: NEGATIVE mg/dL
Hgb urine dipstick: NEGATIVE
Ketones, ur: NEGATIVE mg/dL
Nitrite: NEGATIVE
Specific Gravity, Urine: 1.009 (ref 1.005–1.030)
pH: 5 (ref 5.0–8.0)

## 2022-06-13 LAB — CBC WITH DIFFERENTIAL/PLATELET
Abs Immature Granulocytes: 0.01 10*3/uL (ref 0.00–0.07)
Basophils Absolute: 0 10*3/uL (ref 0.0–0.1)
Basophils Relative: 0 %
Eosinophils Absolute: 0.2 10*3/uL (ref 0.0–0.5)
Eosinophils Relative: 3 %
HCT: 39.2 % (ref 36.0–46.0)
Hemoglobin: 13.4 g/dL (ref 12.0–15.0)
Immature Granulocytes: 0 %
Lymphocytes Relative: 23 %
Lymphs Abs: 1.6 10*3/uL (ref 0.7–4.0)
MCH: 31.1 pg (ref 26.0–34.0)
MCHC: 34.2 g/dL (ref 30.0–36.0)
MCV: 91 fL (ref 80.0–100.0)
Monocytes Absolute: 0.6 10*3/uL (ref 0.1–1.0)
Monocytes Relative: 8 %
Neutro Abs: 4.3 10*3/uL (ref 1.7–7.7)
Neutrophils Relative %: 66 %
Platelets: 332 10*3/uL (ref 150–400)
RBC: 4.31 MIL/uL (ref 3.87–5.11)
RDW: 13.1 % (ref 11.5–15.5)
WBC: 6.7 10*3/uL (ref 4.0–10.5)
nRBC: 0 % (ref 0.0–0.2)

## 2022-06-13 LAB — TROPONIN I (HIGH SENSITIVITY)
Troponin I (High Sensitivity): 7 ng/L (ref <18)
Troponin I (High Sensitivity): 8 ng/L (ref <18)

## 2022-06-13 MED ORDER — AMLODIPINE BESYLATE 5 MG PO TABS: 5.0000 mg | ORAL_TABLET | Freq: Every day | ORAL | 0 refills | Status: DC

## 2022-06-13 MED ORDER — HYDRALAZINE HCL 20 MG/ML IJ SOLN
20.0000 mg | Freq: Once | INTRAMUSCULAR | Status: AC
  Administered 2022-06-13: 20 mg via INTRAVENOUS
  Filled 2022-06-13: qty 1

## 2022-06-13 MED ORDER — HYDRALAZINE HCL 10 MG PO TABS
10.0000 mg | ORAL_TABLET | Freq: Once | ORAL | Status: AC
  Administered 2022-06-13: 10 mg via ORAL
  Filled 2022-06-13: qty 1

## 2022-06-13 NOTE — Discharge Instructions (Addendum)
Please follow-up with your primary care doctor this week, return to the ER if you have any kind of weakness on one side, chest pain, shortness of breath, or intractable nausea, vomiting or severe headache.  Take your blood pressure twice a day and create a chart for your primary care doctor, I have started you on the amlodipine, please take this tomorrow morning.  And take your blood pressure twice a day.  If your blood pressures greater than 180/100 you can take hydralazine as needed.  Be careful to do this with the irbesartan, and the amlodipine as this may cause your blood pressure to drop too suddenly.

## 2022-06-13 NOTE — ED Notes (Signed)
Pt able to ambulate the bathroom without assistance; denies dizziness and had stable gate. Pt back in bed with call light in reach.

## 2022-06-13 NOTE — ED Notes (Signed)
Pt report mild dizziness after attempted to stand to go to xray. VSS; call light in reach. Will continue with POC

## 2022-06-13 NOTE — ED Notes (Signed)
Pt received AVS: Pt educated on BP management and was given another dose of BP prior to d/c. Pt educated on management at home and new BP meds that she is starting. Pt is going to follow up with PCP but was advised to come back if symptoms worsen and has a BP machine at home to monitor. Pt verbalized understanding and had no further questions upon discharge.

## 2022-06-13 NOTE — ED Triage Notes (Signed)
Pt returns ,she has been taking avapro for 3 days. She spoke with primary who advised to not check bp for next 2 days in effort to try to reduce anxiety,therefore hasn't been taking the hydralazine . She checked her BP 242/95.no symtoms.

## 2022-06-13 NOTE — ED Provider Notes (Signed)
[place] EMERGENCY DEPARTMENT AT [facility] Provider Note   CSN: [id number] Arrival date & time: 06/13/22  1713     History  No chief complaint on file.   Elaine Luna is a 77 y.o. female, hx of DM II, hyperlipidemia, who presents to the ED 2/2 to elevated BP. Notes that until 1 week ago BP has been SBP 140s. Denies any headache, chest pain, SOB, N/V, abdominal pain, change in urination. Notes that she noticed BP was very high at dentist 1 week ago, went to PCP and started on avapro 300mg , has been taking avapro with no improvement in BP. Went to ED 3 days ago and checked BP for first time today and it was 260+.     Home Medications Prior to Admission medications   Medication Sig Start Date End Date Taking? Authorizing Provider  amLODipine (NORVASC) 5 MG tablet Take 1 tablet (5 mg total) by mouth daily. 06/13/22  Yes Suhayla Chisom L, PA  albuterol (PROVENTIL) (5 MG/ML) 0.5% nebulizer solution Take 0.5 mLs (2.5 mg total) by nebulization every 6 (six) hours as needed for wheezing or shortness of breath. 05/23/22   Roemhildt, Lorin T, PA-C  albuterol (VENTOLIN HFA) 108 (90 Base) MCG/ACT inhaler Inhale 2 puffs into the lungs every 6 (six) hours as needed. 05/29/22   Luciano Cutter, MD  atorvastatin (LIPITOR) 20 MG tablet Take 1 tablet (20 mg total) by mouth daily. 02/20/22     budesonide-formoterol (SYMBICORT) 80-4.5 MCG/ACT inhaler Inhale 2 puffs into the lungs 2 (two) times daily. 05/29/22   Luciano Cutter, MD  Denosumab (PROLIA [place]) Inject into the skin every 6 (six) months.    [provider]  hydrALAZINE (APRESOLINE) 10 MG tablet Take 1 tablet (10 mg total) by mouth every 6 (six) hours as needed. As you are restarting your Avapro, you may take hydralazine 10 mg every 6 hours as needed for blood pressure that remains greater than 180 top number or 90 bottom number 06/10/22   Arby Barrette, MD  irbesartan (AVAPRO) 300 MG tablet TAKE 1/2 TABLET BY MOUTH ONCE DAILY  02/20/22     pantoprazole (PROTONIX) 20 MG tablet Take 1 tablet (20 mg total) by mouth daily. 02/20/22     Vitamin D, Ergocalciferol, (DRISDOL) 1.25 MG (50000 UNIT) CAPS capsule Take 1 capsule (50,000 Units total) by mouth 2 (two) times a week. 10/10/20         Allergies    Ace inhibitors    Review of Systems   Review of Systems  Respiratory:  Negative for shortness of breath.   Cardiovascular:  Negative for chest pain.    Physical Exam Updated Vital Signs BP (!) 165/73   Pulse (!) 59   Temp 98.1 F (36.7 C)   Resp 15   SpO2 98%  Physical Exam Vitals and nursing note reviewed.  Constitutional:      Appearance: Normal appearance.  HENT:     Head: Normocephalic and atraumatic.     Right Ear: Tympanic membrane normal.     Left Ear: Tympanic membrane normal.     Nose: Nose normal.     Mouth/Throat:     Mouth: Mucous membranes are moist.  Eyes:     Extraocular Movements: Extraocular movements intact.     Conjunctiva/sclera: Conjunctivae normal.     Pupils: Pupils are equal, round, and reactive to light.  Cardiovascular:     Rate and Rhythm: Normal rate and regular rhythm.  Pulmonary:  Effort: Pulmonary effort is normal.     Breath sounds: Normal breath sounds.  Abdominal:     General: Abdomen is flat. Bowel sounds are normal.     Palpations: Abdomen is soft.  Musculoskeletal:        General: Normal range of motion.     Cervical back: Normal range of motion and neck supple.  Skin:    General: Skin is warm and dry.     Capillary Refill: Capillary refill takes less than 2 seconds.  Neurological:     General: No focal deficit present.     Mental Status: She is alert.  Psychiatric:        Mood and Affect: Mood normal.        Thought Content: Thought content normal.     ED Results / Procedures / Treatments   Labs (all labs ordered are listed, but only abnormal results are displayed) Labs Reviewed  COMPREHENSIVE METABOLIC PANEL - Abnormal; Notable for the following  components:      Result Value   Glucose, Bld 113 (*)    Creatinine, Ser 1.22 (*)    AST 13 (*)    GFR, Estimated 45 (*)    All other components within normal limits  URINALYSIS, ROUTINE W REFLEX MICROSCOPIC - Abnormal; Notable for the following components:   Protein, ur TRACE (*)    Leukocytes,Ua TRACE (*)    All other components within normal limits  CBC WITH DIFFERENTIAL/PLATELET  TROPONIN I (HIGH SENSITIVITY)  TROPONIN I (HIGH SENSITIVITY)    EKG None  Radiology DG Chest Port 1 View  Result Date: 06/13/2022 CLINICAL DATA:  Hypertension EXAM: PORTABLE CHEST 1 VIEW COMPARISON:  Chest x-ray 05/23/2022 FINDINGS: Heart is mildly enlarged. There is no focal lung infiltrate, pleural effusion or pneumothorax. No acute fractures are seen. IMPRESSION: Mild cardiomegaly. No active disease. Electronically Signed   By: Darliss Cheney M.D.   On: 06/13/2022 19:51    Procedures Procedures    Medications Ordered in ED Medications  hydrALAZINE (APRESOLINE) injection 20 mg (20 mg Intravenous Given 06/13/22 1842)    ED Course/ Medical Decision Making/ A&P                             Medical Decision Making Patient is a 77 year old female, history of hyperlipidemia, diabetes, who presents to the ED secondary to elevated blood pressure medication.  She did start her irbesartan 300 mg last week, and states that it was elevated today, after not taking it for 2 days.  She has not tried the hydralazine, that she was sent on 5/8.  She states this hypertension is newly diagnosed.  She has no symptoms.  We will obtain troponins, blood work, to evaluate for endorgan damage.  And give hydralazine as her blood pressure is 260/100+  Amount and/or Complexity of Data Reviewed Labs: ordered.    Details: Unremarkable, no elevated creatinine, no protein in the urine Radiology: ordered.    Details: Chest x-ray clear ECG/medicine tests:     Details: Normal sinus rhythm Discussion of management or test  interpretation with external provider(s): Discussed with patient, her labs are unremarkable, her blood pressure stayed stable for about 2-year hours, after the hydralazine, it went from 261/77, to 160s over 60s.  This is a great improvement.  We discussed using hydralazine as needed, versus starting amlodipine 5 mg, with the irbesartan, she would like to start the amlodipine, and follow-up with her primary care  doctor.  At this time she is asymptomatic, her labs are unremarkable, with 2 negative troponins, and clear lab, and so she was discharged home.  She was given strict return precautions and discharged home.  It was voiced that she will need to see her primary care doctor this week for further evaluation.  Risk Prescription drug management.    Final Clinical Impression(s) / ED Diagnoses Final diagnoses:  Hypertension, unspecified type    Rx / DC Orders ED Discharge Orders          Ordered    amLODipine (NORVASC) 5 MG tablet  Daily        06/13/22 2136              Pete Pelt, [place] 06/13/22 2140    Benjiman Core, MD 06/13/22 2328

## 2022-06-13 NOTE — ED Notes (Signed)
Pt is feeling better now, denies dizziness; call light in reach. No other needs at this time.

## 2022-06-15 ENCOUNTER — Other Ambulatory Visit (HOSPITAL_BASED_OUTPATIENT_CLINIC_OR_DEPARTMENT_OTHER): Payer: Self-pay

## 2022-06-15 ENCOUNTER — Ambulatory Visit: Payer: Medicare Other | Admitting: Psychology

## 2022-06-15 MED ORDER — AMLODIPINE BESYLATE 5 MG PO TABS
5.0000 mg | ORAL_TABLET | Freq: Every day | ORAL | 3 refills | Status: DC
  Filled 2022-06-15: qty 90, 90d supply, fill #0

## 2022-06-15 MED ORDER — ALPRAZOLAM 0.25 MG PO TABS
0.2500 mg | ORAL_TABLET | Freq: Two times a day (BID) | ORAL | 2 refills | Status: AC
  Filled 2022-06-15: qty 60, 30d supply, fill #0

## 2022-06-25 ENCOUNTER — Other Ambulatory Visit (HOSPITAL_BASED_OUTPATIENT_CLINIC_OR_DEPARTMENT_OTHER): Payer: Self-pay

## 2022-06-25 MED ORDER — AMLODIPINE BESYLATE 10 MG PO TABS
10.0000 mg | ORAL_TABLET | Freq: Every day | ORAL | 3 refills | Status: DC
  Filled 2022-06-25: qty 90, 90d supply, fill #0

## 2022-07-01 ENCOUNTER — Other Ambulatory Visit: Payer: Self-pay | Admitting: Internal Medicine

## 2022-07-01 ENCOUNTER — Ambulatory Visit (HOSPITAL_BASED_OUTPATIENT_CLINIC_OR_DEPARTMENT_OTHER)
Admission: RE | Admit: 2022-07-01 | Discharge: 2022-07-01 | Disposition: A | Discharge status: Home / Self Care | Payer: Medicare Other | Source: Ambulatory Visit | Attending: Internal Medicine | Admitting: Internal Medicine

## 2022-07-01 ENCOUNTER — Encounter (HOSPITAL_BASED_OUTPATIENT_CLINIC_OR_DEPARTMENT_OTHER): Payer: Self-pay

## 2022-07-01 ENCOUNTER — Other Ambulatory Visit (HOSPITAL_BASED_OUTPATIENT_CLINIC_OR_DEPARTMENT_OTHER): Payer: Self-pay

## 2022-07-01 DIAGNOSIS — R0602 Shortness of breath: Secondary | ICD-10-CM | POA: Diagnosis present

## 2022-07-01 DIAGNOSIS — E041 Nontoxic single thyroid nodule: Secondary | ICD-10-CM

## 2022-07-01 MED ORDER — AMLODIPINE BESYLATE 5 MG PO TABS
5.0000 mg | ORAL_TABLET | Freq: Every day | ORAL | 3 refills | Status: DC
  Filled 2022-07-01: qty 90, 90d supply, fill #0
  Filled 2022-07-01 (×2): qty 30, 30d supply, fill #0
  Filled 2022-07-29: qty 30, 30d supply, fill #1
  Filled ????-??-??: fill #0

## 2022-07-01 MED ORDER — IOHEXOL 350 MG/ML SOLN
100.0000 mL | Freq: Once | INTRAVENOUS | Status: AC | PRN
  Administered 2022-07-01: 60 mL via INTRAVENOUS

## 2022-07-03 ENCOUNTER — Other Ambulatory Visit (HOSPITAL_BASED_OUTPATIENT_CLINIC_OR_DEPARTMENT_OTHER): Payer: Self-pay

## 2022-07-03 ENCOUNTER — Ambulatory Visit: Payer: Medicare Other | Attending: Student | Admitting: Student

## 2022-07-03 ENCOUNTER — Encounter: Payer: Self-pay | Admitting: Student

## 2022-07-03 VITALS — BP 142/76 | HR 57 | Ht 64.0 in | Wt 134.8 lb

## 2022-07-03 DIAGNOSIS — R0602 Shortness of breath: Secondary | ICD-10-CM | POA: Diagnosis present

## 2022-07-03 DIAGNOSIS — R0609 Other forms of dyspnea: Secondary | ICD-10-CM | POA: Diagnosis present

## 2022-07-03 DIAGNOSIS — I10 Essential (primary) hypertension: Secondary | ICD-10-CM | POA: Insufficient documentation

## 2022-07-03 MED ORDER — CHLORTHALIDONE 25 MG PO TABS
12.5000 mg | ORAL_TABLET | Freq: Every day | ORAL | 1 refills | Status: DC
  Filled 2022-07-03: qty 45, 90d supply, fill #0

## 2022-07-03 NOTE — Progress Notes (Signed)
Cardiology Clinic Note   Date: 07/03/2022 ID: Elaine Luna 1945/11/17, MRN [id number]  Primary Cardiologist:  Reatha Harps, MD  Patient Profile    Elaine Luna is a 77 y.o. female who presents to the clinic today for evaluation of hypertension.  Past medical history significant for: Bradycardia. 3 day ZIO 12/06/2018: Predominant rhythm was sinus bradycardia at 49 bpm.  No significant arrhythmias.  Rare PACs and PVCs. Hypertension. Hyperlipidemia. T2DM. Asthma. PE. CTA chest (PE protocol) 11/14/2018: Several tiny PE in segmental branches of the right upper lobe, right lower lobe, lingula, and left lower lobe.  Small clot burden.  No right heart strain.   History of Present Illness    Elaine Luna was evaluated by Dr. Flora Lipps on 11/11/2018 for bradycardia at the request of Dr. Everardo All. She reported palpitations and fluttering in chest that occurred daily. Her EKG showed sinus rhythm with frequent PVCs. It was suspected she was getting low HR readings with her monitor secondary to PVCs. 3 day zio showed sinus bradycardia with rare ectopy.  Before patient presented for follow-up she was seen in the ED and found to have pulmonary emboli and pneumonia.  It was decided to hold off on stress testing until she has time to recover from COVID, pneumonia, PE.  Started on diltiazem.  Patient underwent an exercise tolerance test April 2022 which was inconclusive secondary to poor exercise capacity and inability to meet target heart rate.  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 She has not been seen since.   Patient has had three ED visits this year. She presented to the ED on 4/20/024 for shortness of breath felt to be asthma exacerbation. She presented to the [facility] ED on 06/10/2022 with hypertension with  systolic in the 250s.  She was started on irbesartan.  Patient returned to the ED on 06/13/2018 3:24 days of taking irbesartan with BP 242/95.  BP came down with hydralazine and she was discharged.  Today, patient reports BP has been high at home.  190/80 at home.  Sometimes her blood pressure cuff cannot read her blood pressure.  Her PCP decreased amlodipine to 5 mg daily secondary to some lightheadedness.  BP prior to the last 2 weeks was typically 140/80.  She reports brief fluttering in her chest that resolves with rest.  She reports some shortness of breath with exertion and at rest.  She does have a history of asthma but feels that is very well-controlled.  She denies chest pain, tightness, pressure.  No headaches, presyncope, syncope.  She is very anxious which she reports is new for her.  Prior to episodes of hypertension [facility] [employer]  She has not had any further episodes of bradycardia.  She has a thyroid ultrasound coming up for a nodule that has grown very slightly over the past 4 years and a sleep study at the end of July.   ROS: All other systems reviewed and are otherwise negative except as noted in History of Present Illness.  Studies Reviewed    ECG is not ordered today.  Risk Assessment/Calculations      HYPERTENSION CONTROL Vitals:   07/03/22 1546 07/03/22 1738  BP: (!) 172/76 (!) 142/76    The patient's blood pressure is elevated above target today.  In order to  address the patient's elevated BP: A new medication was prescribed today.           Physical Exam    VS:  BP (!) 172/76 (BP Location: Right Arm, Patient Position: Sitting, Cuff Size: Normal)   Pulse (!) 57   Ht 5\' 4"  (1.626 m)   Wt 134 lb 12.8 oz (61.1 kg)   SpO2 98%   BMI 23.14 kg/m  , BMI Body mass index is 23.14 kg/m.  GEN: Well nourished, well developed, in no acute distress. Neck: No JVD or carotid bruits. Cardiac: RRR. No murmurs. No rubs or gallops.   Respiratory:   Respirations regular and unlabored. Clear to auscultation without rales, wheezing or rhonchi. GI: Soft, nontender, nondistended. Extremities: Radials/DP/PT 2+ and equal bilaterally. No clubbing or cyanosis. No edema.  Skin: Warm and dry, no rash. Neuro: Strength intact.  Assessment & Plan   Hypertension. BP today 172/76 on intake and 142/76 on recheck.  Patient denies headaches, presyncope, or syncope.  She is very anxious about her BP being so high.  Start chlorthalidone 12.5 mg daily.  BP log provided.  She is instructed to check BP 2 hours after medications in the morning and 1 time in the evening.  Continue irbesartan and amlodipine. Shortness of breath/DOE.  Patient reports shortness of breath and dyspnea with exertion that is different from the shortness of breath she feels with her asthma.  This has occurred over the last 2 weeks since her high blood pressure readings.  Will get an echo for further evaluation. Palpitations/fluttering in chest.  Patient reports brief episodes of fluttering in her chest that lasts just a few minutes and resolve on their own or with rest.  She checks her pulse and it is regular and normal rate.  She is very anxious and about how high her blood pressure has been.  I believe at this point this is likely anxiety.  Will defer evaluation with ZIO.  If symptoms persist after BP is under control can consider doing a ZIO at that point.  Disposition: Chlorthalidone 12.5 mg daily.  Echo for DOE.  Return in 4 to 6 weeks or sooner as needed.         Signed, Etta Grandchild. Elaine Pla, DNP, NP-C

## 2022-07-03 NOTE — Patient Instructions (Addendum)
Medication Instructions:   START: CHLORTHALIDONE 12.5mg  ONCE DAILY   *If you need a refill on your cardiac medications before your next appointment, please call your pharmacy*  Lab Work: None Ordered At This Time.   If you have labs (blood work) drawn today and your tests are completely normal, you will receive your results only by: MyChart Message (if you have MyChart) OR A paper copy in the mail If you have any lab test that is abnormal or we need to change your treatment, we will call you to review the results.  Testing/Procedures: Your physician has requested that you have an echocardiogram. Echocardiography is a painless test that uses sound waves to create images of your heart. It provides your doctor with information about the size and shape of your heart and how well your heart's chambers and valves are working. You may receive an ultrasound enhancing agent through an IV if needed to better visualize your heart during the echo.This procedure takes approximately one hour. There are no restrictions for this procedure. This will take place at the [address].   Follow-Up: At [facility], you and your health needs are our priority.  As part of our continuing mission to provide you with exceptional heart care, we have created designated Provider Care Teams.  These Care Teams include your primary Cardiologist (physician) and Advanced Practice Providers (APPs -  Physician Assistants and Nurse Practitioners) who all work together to provide you with the care you need, when you need it.  Your next appointment:   4-6 week(s)  Provider:   Carlos Levering, NP

## 2022-07-13 ENCOUNTER — Ambulatory Visit: Payer: Medicare Other | Admitting: Psychology

## 2022-07-27 ENCOUNTER — Ambulatory Visit: Payer: Medicare Other | Admitting: Psychology

## 2022-07-28 ENCOUNTER — Institutional Professional Consult (permissible substitution): Payer: Medicare Other | Admitting: Neurology

## 2022-07-29 ENCOUNTER — Other Ambulatory Visit (HOSPITAL_BASED_OUTPATIENT_CLINIC_OR_DEPARTMENT_OTHER): Payer: Self-pay

## 2022-07-29 ENCOUNTER — Other Ambulatory Visit: Payer: Medicare Other

## 2022-07-30 ENCOUNTER — Encounter: Payer: Self-pay | Admitting: Neurology

## 2022-07-30 ENCOUNTER — Ambulatory Visit (INDEPENDENT_AMBULATORY_CARE_PROVIDER_SITE_OTHER): Payer: Medicare Other | Admitting: Neurology

## 2022-07-30 VITALS — BP 122/82 | Ht 63.0 in | Wt 138.0 lb

## 2022-07-30 DIAGNOSIS — I2729 Other secondary pulmonary hypertension: Secondary | ICD-10-CM

## 2022-07-30 DIAGNOSIS — I2699 Other pulmonary embolism without acute cor pulmonale: Secondary | ICD-10-CM | POA: Insufficient documentation

## 2022-07-30 DIAGNOSIS — U099 Post covid-19 condition, unspecified: Secondary | ICD-10-CM

## 2022-07-30 DIAGNOSIS — G4710 Hypersomnia, unspecified: Secondary | ICD-10-CM

## 2022-07-30 DIAGNOSIS — R053 Chronic cough: Secondary | ICD-10-CM

## 2022-07-30 DIAGNOSIS — Z86711 Personal history of pulmonary embolism: Secondary | ICD-10-CM | POA: Insufficient documentation

## 2022-07-30 NOTE — Progress Notes (Signed)
SLEEP MEDICINE CLINIC    Provider:  Melvyn Novas, MD  Primary Care Physician:  Elaine Corn, MD [address] [place] [address]     Referring Provider: Creola Corn, Md [address] [place],  [place] [address]          Chief Complaint according to patient   Patient presents with:     New Patient (Initial Visit)     Elaine Binder, RN .Dr. Timothy Lasso sent her for HTN.. Patient states she is not expecting to have OSA but causes for high blood pressure .Patient states she gets a good night sleep, usually 8-10 hours.      HISTORY OF PRESENT ILLNESS:  Elaine Luna is a 77 y.o. 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 Marland Kitchen  Chief concern according to patient : " I a have always been a great sleeper,I was a snorer when I was heavier- 100 pounds I lost after COVID 2020, Smell and taste have not recovered. I had palpitations and pulmonary emboli. I lost my son to COVID in 2022, also with PE.     I have the pleasure of seeing Elaine ABDELRAHMAN, RN,  07/30/22 a right-handed female with a possible sleep disorder.    The patient had the first sleep study in the year 1990 in [place]  with a result of an AHI ( Apnea Hypopnea index)  of less than 5 and loud snoring.   Sleep relevant medical history: Nocturia once, Tonsillectomy at age 86, COVID with PE in Spring 2020 .   Family medical /sleep history: No other family member on CPAP with OSA.brother died of agent orange effect, DM- , accepted causality by [place].  Social history:  Patient is retired from Publishing copy at [employer] and lives in a household  alone. Family status is widowed, she married and became a mother at age 64,  with 2 living adult children, she lost her eldest to [place] and youngest son 7 years ago to suicide. [place]-  grandchildren, great grand children  [place]:Marland Kitchen  Tobacco use;  none .    ETOH use ; none , Caffeine intake in form of Coffee( 2 cups a day) Soda( /) Tea ( /) or energy drinks Exercise in form of - [employer] member  silver sneaker. .      Sleep habits are as follows: The patient's dinner time is between 5-6 PM. The patient goes to bed at 10 PM and continues to sleep for 10 hours, wakes for one bathroom breaks, the first time at 4 AM.   The preferred sleep position is laterally, with the support of 1 pillow. No GERD, Dreams are reportedly frequent/vivid. The patient wakes up spontaneously /7 -8 AM is the usual rise time.  She reports not feeling refreshed or restored in AM, with symptoms such as dry mouth, morning headaches, and residual fatigue.  Naps are taken infrequently  Review of Systems: Out of a complete 14 system review, the patient complains of only the following symptoms, and all other reviewed systems are negative.:   I love to sleep have been a good baby, a strong , happy sleeper.     How likely are you to doze in the following situations: 0 = not likely, 1 = slight chance, 2 = moderate chance, 3 = high chance  Sitting and Reading? Watching Television? Sitting inactive in a public place (theater or meeting)? As a passenger in a car for an hour without a break? Lying down in the afternoon when circumstances permit? Sitting and talking to someone? Sitting quietly after lunch without alcohol? In a car, while stopped for a few minutes in traffic?   Total = 10/ 24 points   FSS endorsed at 20/ 63 points.   Social History   Socioeconomic History   Marital status: Widowed    Spouse name: Not on file   Number of children: 4   Years of education: Not on file   Highest education level: Not on file  Occupational History   Occupation: Teacher, adult education: [employer]  Tobacco Use   Smoking status: Former    Packs/day: 0.30    Years: 10.00    Additional pack years: 0.00    Total pack years: 3.00    Types: Cigarettes    Start date: 56    Quit date:  02/03/1975    Years since quitting: 47.5   Smokeless tobacco: Never  Vaping Use   Vaping Use: Never used  Substance and Sexual Activity   Alcohol use: No   Drug use: No   Sexual activity: Not on file  Other Topics Concern   Not on file  Social History Narrative   Not on file   Social Determinants of Health   Financial Resource Strain: Not on file  Food Insecurity: Not on file  Transportation Needs: Not on file  Physical Activity: Not on file  Stress: Not on file  Social Connections: Not on file    Family History  Problem Relation Age of Onset   Heart disease Mother    Heart disease Father    Diabetes Brother    Heart disease Brother    Diabetes Maternal Grandmother    Diabetes Maternal Grandfather    Asthma Other        all children   Colon cancer Neg Hx    Esophageal cancer Neg Hx    Rectal cancer Neg Hx    Stomach cancer Neg Hx     Past Medical History:  Diagnosis Date   Asthma    Colon polyps    DM type 2 (diabetes mellitus, type 2) (HCC) 2015    Hyperlipidemia    Hypertension    Obesity    Pulmonary embolism associated with COVID-19 (HCC) 12/25/2018   Tubular adenoma     Past Surgical History:  Procedure Laterality Date   APPENDECTOMY     CARDIAC CATHETERIZATION     CATARACT EXTRACTION, BILATERAL  2011   CHOLECYSTECTOMY     ESOPHAGOGASTRODUODENOSCOPY  2011   TONSILLECTOMY     VESICOVAGINAL FISTULA CLOSURE W/ TAH       Current Outpatient Medications on File Prior to Visit  Medication Sig Dispense Refill   albuterol (VENTOLIN HFA) 108 (90 Base) MCG/ACT inhaler Inhale 2 puffs into the lungs every 6 (six) hours as needed. 6.7 g 3   ALPRAZolam (XANAX) 0.25 MG tablet Take 1 tablet (0.25 mg total) by mouth 2 (two) times daily as needed for anxiety. 60 tablet 2   amLODipine (NORVASC) 5 MG tablet Take 1 tablet (5 mg total) by mouth daily as directed 90 tablet 3   atorvastatin (LIPITOR) 20 MG tablet Take 1 tablet (20 mg total) by mouth daily. 90 tablet 3    budesonide-formoterol (SYMBICORT) 80-4.5 MCG/ACT inhaler Inhale 2 puffs into the lungs 2 (two)  times daily. 10.2 g 1   chlorthalidone (HYGROTON) 25 MG tablet Take 0.5 tablets (12.5 mg total) by mouth daily. 45 tablet 1   Denosumab (PROLIA ) Inject into the skin every 6 (six) months.     irbesartan (AVAPRO) 300 MG tablet TAKE 1/2 TABLET BY MOUTH ONCE DAILY 45 tablet 3   pantoprazole (PROTONIX) 20 MG tablet Take 1 tablet (20 mg total) by mouth daily. 90 tablet 3   Vitamin D, Ergocalciferol, (DRISDOL) 1.25 MG (50000 UNIT) CAPS capsule Take 1 capsule (50,000 Units total) by mouth 2 (two) times a week. 24 capsule 3   No current facility-administered medications on file prior to visit.    Allergies  Allergen Reactions   Ace Inhibitors     Other Reaction(s): Unknown     DIAGNOSTIC DATA (LABS, IMAGING, TESTING) - I reviewed patient records, labs, notes, testing and imaging myself where available.  Lab Results  Component Value Date   WBC 6.7 06/13/2022   HGB 13.4 06/13/2022   HCT 39.2 06/13/2022   MCV 91.0 06/13/2022   PLT 332 06/13/2022      Component Value Date/Time   NA 140 06/13/2022 1829   K 3.9 06/13/2022 1829   CL 105 06/13/2022 1829   CO2 25 06/13/2022 1829   GLUCOSE 113 (H) 06/13/2022 1829   BUN 21 06/13/2022 1829   CREATININE 1.22 (H) 06/13/2022 1829   CALCIUM 9.6 06/13/2022 1829   PROT 6.8 06/13/2022 1829   ALBUMIN 4.2 06/13/2022 1829   AST 13 (L) 06/13/2022 1829   ALT 11 06/13/2022 1829   ALKPHOS 63 06/13/2022 1829   BILITOT 1.0 06/13/2022 1829   GFRNONAA 45 (L) 06/13/2022 1829   GFRAA 48 (L) 11/14/2018 1501   No results found for: "CHOL", "HDL", "LDLCALC", "LDLDIRECT", "TRIG", "CHOLHDL" No results found for: "HGBA1C" No results found for: "VITAMINB12" Lab Results  Component Value Date   TSH 2.210 11/14/2018    PHYSICAL EXAM:  Today's Vitals   07/30/22 1302  BP: 122/82  Weight: 138 lb (62.6 kg)  Height: 5\' 3"  (1.6 m)   Body mass index is 24.45 kg/m.    Wt Readings from Last 3 Encounters:  07/30/22 138 lb (62.6 kg)  07/03/22 134 lb 12.8 oz (61.1 kg)  06/10/22 140 lb (63.5 kg)     Ht Readings from Last 3 Encounters:  07/30/22 5\' 3"  (1.6 m)  07/03/22 5\' 4"  (1.626 m)  06/10/22 5\' 5"  (1.651 m)      General: The patient is awake, alert and appears not in acute distress. The patient is well groomed. Head: Normocephalic, atraumatic. Neck is supple. Mallampati 3,  neck circumference:14.5  inches . Nasal airflow fully patent.  Retrognathia is not seen.  Dental status: biological .  Cardiovascular:  Regular rate and cardiac rhythm by pulse,  without distended neck veins. Respiratory: Lungs are clear to auscultation.  Skin:  Without evidence of ankle edema, or rash. Trunk: The patient's posture is erect.   NEUROLOGIC EXAM: The patient is awake and alert, oriented to place and time.   Memory subjective described as intact.  Attention span & concentration ability appears normal.  Speech is fluent,  without  dysarthria, dysphonia or aphasia.  Mood and affect are appropriate.   Cranial nerves: no loss of smell or taste reported  Pupils are equal and briskly reactive to light. Funduscopic exam deferred.  Extraocular movements in vertical and horizontal planes were intact and without nystagmus. No Diplopia. Visual fields by finger perimetry are intact. Hearing  was intact to soft voice and finger rubbing.    Facial sensation intact to fine touch.  Facial motor strength is symmetric and tongue and uvula move midline.  Neck ROM : rotation, tilt and flexion extension were normal for age and shoulder shrug was symmetrical.    Motor exam:  Symmetric bulk, tone and ROM.   Normal tone without cog wheeling, symmetric grip strength .   Sensory:  Fine touch and vibration were tested  and  normal.  Proprioception tested in the upper extremities was normal.   Coordination: Rapid alternating movements in the fingers/hands were of normal speed.   Gait and station: Patient could rise unassisted from a seated position, walked without assistive device.  Stance is of normal width/ base and the patient turned with 3 steps.  Toe and heel walk were deferred.  Deep tendon reflexes: in the  upper and lower extremities are symmetric and intact.      ASSESSMENT AND PLAN 77 y.o. year old female  here with:    1) unexplained high peak BP values, but here , today in normal rage. 122/ 82   2) nurse called me and has a history of severe COVID infection she contracted this in spring 2020 and was severely ill she developed pulmonary emboli, at the time she was morbidly obese but lost 100 pounds in the process partially due to losing her sense of taste and smell which has not recovered fully.  She has never been physically quite as strong again there is a tendency to become short of breath.  However she has not been woken up by snoring and she is not feeling short of breath at rest.  She describes no problems at this time with palpitations or diaphoresis at night vivid dreams happen but they do not necessarily wake her and she is not known to sleepwalk or have any parasomnia activity.  Her 60-year-old grandchild has told her that she is sleep talking.   Plan ; Screening for sleep apnea would be best done in a lab, given the co-morbidities.  I am not sure how soon we can work her in, the a is a screening test by HST and then follow up as indicated.   I plan to follow up either personally or through our NP within 2-5 months.   I would like to thank Elaine Corn, MD for allowing me to meet with and to take care of this pleasant patient.    After spending a total time of  45  minutes face to face and additional time for physical and neurologic examination, review of laboratory studies,  personal review of imaging studies, reports and results of other testing and review of referral information / records as far as provided in visit,   Electronically signed  by: Melvyn Novas, MD 07/30/2022 1:12 PM  [facility] Board certified by The [employer] of Sleep Medicine and Diplomate of the [employer] of Sleep Medicine. Board certified In Neurology through the ABPN, Fellow of the [employer] of Neurology.

## 2022-07-30 NOTE — Patient Instructions (Signed)
Screening for Sleep Apnea  Sleep apnea is a condition in which breathing pauses or becomes shallow during sleep. Sleep apnea screening is a test to determine if you are at risk for sleep apnea. The test includes a series of questions. It will only takes a few minutes. Your health care provider may ask you to have this test in preparation for surgery or as part of a physical exam. What are the symptoms of sleep apnea? Common symptoms of sleep apnea include: Snoring. Waking up often at night. Daytime sleepiness. Pauses in breathing. Choking or gasping during sleep. Irritability. Forgetfulness. Trouble thinking clearly. Depression. Personality changes. Most people with sleep apnea do not know that they have it. What are the advantages of sleep apnea screening? Getting screened for sleep apnea can help: Ensure your safety. It is important for your health care providers to know whether or not you have sleep apnea, especially if you are having surgery or have other long-term (chronic) health conditions. Improve your health and allow you to get a better night's rest. Restful sleep can help you: Have more energy. Lose weight. Improve high blood pressure. Improve diabetes management. Prevent stroke. Prevent car accidents. What happens during the screening? Screening usually includes being asked a list of questions about your sleep quality. Some questions you may be asked include: Do you snore? Is your sleep restless? Do you have daytime sleepiness? Has a partner or spouse told you that you stop breathing during sleep? Have you had trouble concentrating or memory loss? What is your age? What is your neck circumference? To measure your neck, keep your back straight and gently wrap the tape measure around your neck. Put the tape measure at the middle of your neck, between your chin and collarbone. What is your sex assigned at birth? Do you have or are you being treated for high blood  pressure? If your screening test is positive, you are at risk for the condition. Further testing may be needed to confirm a diagnosis of sleep apnea. Where to find more information You can find screening tools online or at your health care clinic. For more information about sleep apnea screening and healthy sleep, visit these websites: Centers for Disease Control and Prevention: www.cdc.gov American Sleep Apnea Association: www.sleepapnea.org Contact a health care provider if: You think that you may have sleep apnea. Summary Sleep apnea screening can help determine if you are at risk for sleep apnea. It is important for your health care providers to know whether or not you have sleep apnea, especially if you are having surgery or have other chronic health conditions. You may be asked to take a screening test for sleep apnea in preparation for surgery or as part of a physical exam. This information is not intended to replace advice given to you by your health care provider. Make sure you discuss any questions you have with your health care provider. Document Revised: 12/29/2019 Document Reviewed: 12/29/2019 Elsevier Patient Education  2024 Elsevier Inc.  

## 2022-08-04 ENCOUNTER — Telehealth: Payer: Self-pay | Admitting: Neurology

## 2022-08-04 NOTE — Telephone Encounter (Signed)
HST- Medicare-no auth req'd(needs to be scheduled after 7/21)   Patient is scheduled at [facility] for 08/25/2022 at 11:30 AM.  Mailed packet to the patient.

## 2022-08-05 ENCOUNTER — Ambulatory Visit
Admission: RE | Admit: 2022-08-05 | Discharge: 2022-08-05 | Disposition: A | Discharge status: Home / Self Care | Payer: Medicare Other | Source: Ambulatory Visit | Attending: Internal Medicine | Admitting: Internal Medicine

## 2022-08-05 ENCOUNTER — Ambulatory Visit (HOSPITAL_COMMUNITY): Payer: Medicare Other | Attending: Student

## 2022-08-05 DIAGNOSIS — R0609 Other forms of dyspnea: Secondary | ICD-10-CM | POA: Insufficient documentation

## 2022-08-05 DIAGNOSIS — E041 Nontoxic single thyroid nodule: Secondary | ICD-10-CM

## 2022-08-05 LAB — ECHOCARDIOGRAM COMPLETE
Area-P 1/2: 2 cm2
Calc EF: 62.5 %
S' Lateral: 3 cm
Single Plane A2C EF: 66.1 %
Single Plane A4C EF: 58.8 %

## 2022-08-10 ENCOUNTER — Other Ambulatory Visit: Payer: Self-pay | Admitting: Internal Medicine

## 2022-08-10 ENCOUNTER — Ambulatory Visit: Payer: Medicare Other | Admitting: Psychology

## 2022-08-10 DIAGNOSIS — E041 Nontoxic single thyroid nodule: Secondary | ICD-10-CM

## 2022-08-17 ENCOUNTER — Ambulatory Visit: Payer: Medicare Other | Admitting: Physician Assistant

## 2022-08-24 ENCOUNTER — Ambulatory Visit (INDEPENDENT_AMBULATORY_CARE_PROVIDER_SITE_OTHER): Payer: Medicare Other | Admitting: Psychology

## 2022-08-24 DIAGNOSIS — F4323 Adjustment disorder with mixed anxiety and depressed mood: Secondary | ICD-10-CM

## 2022-08-24 NOTE — Progress Notes (Signed)
                                                Date: 08/24/2022  Diagnosis Adj. Disorder with anxiety and depression Symptoms unspecified Medication Status compliance  Safety none  If Suicidal or Homicidal State Action Taken: unspecified  Current Risk: low Medications unspecified Objectives unspecified Client Response full compliance  Service Location Location, [address] [place], [place] [address]  Service Code cpt [phone]  Normalize/Reframe  Facilitate problem solving  Validate/empathize  Distress tolerance skill  Emotion regulation skills  Self care activities  Self-monitoring  Mindfulness training  Session Notes:  Dx: Adjustment Disorder with Anxiety and Depression  Meds: [facility] for asthma, zolair (injection), blood pressure Meds. No psychotropic.  Goals: Patient is looking to reduce symptoms of anxiety and depression. Needs help adjusting to retirement. Motivation and energy is very low. Individual therapy to define stategies to reduce symptoms. Goal date is 12-23. Revised goal date is 12-24  Patient agrees to a video Public affairs consultant) session and is aware of the platform limitations. She is at home and I am in my home office. Ula says that she has spent a lot of time with family in the past couple of months. She says that she is now clear she doesn't want to live so isolated from family and would prefer to locate closer to family. She says she fills empty, except when with family. Now she has to determine her next steps. She is considering moving to [place] near daughter and daughter in law. Both have offered to have Hope live with them. She still has feelings of anxiety in the morning and briefly feels disoriented. She has thoughts of her mortality and has some fears about "how it is all going to end".                                                                              Garrel Ridgel, PhD  1:10p-2:00p 50 minutes

## 2022-08-25 ENCOUNTER — Ambulatory Visit: Payer: Medicare Other | Admitting: Neurology

## 2022-08-25 DIAGNOSIS — G4710 Hypersomnia, unspecified: Secondary | ICD-10-CM | POA: Diagnosis not present

## 2022-08-25 DIAGNOSIS — U099 Post covid-19 condition, unspecified: Secondary | ICD-10-CM

## 2022-08-25 DIAGNOSIS — Z86711 Personal history of pulmonary embolism: Secondary | ICD-10-CM

## 2022-08-25 DIAGNOSIS — I2699 Other pulmonary embolism without acute cor pulmonale: Secondary | ICD-10-CM

## 2022-08-26 NOTE — Progress Notes (Signed)
[facility]  Danie Binder, R.N.  77 year old female Jul 22, 1945   HOME SLEEP TEST REPORT ( by Watch PAT)   STUDY DATA:  08-27-2022   ORDERING CLINICIAN: Melvyn Novas, MD  REFERRING CLINICIAN: Dr Timothy Lasso, MD    CLINICAL INFORMATION/HISTORY: severe persistent asthma, primary HTN, post covid chronic cough, SOB. "seen upon referral  by Dr Timothy Lasso on 07/30/2022 for an evaluation of sleep apnea,as possible cause of sudden peaks in BP.  The patient also reports vivid dreams, a history of pulmonary emboli post-COVID waking up with a dry mouth and having morning headaches.  She sleeps between 8 and 10 hours each night.  Chief concern according to patient : " I  have always been a great sleeper, I was a snorer when I was much heavier- by 100 pounds. I lost weight after COVID , Smell and taste have not recovered. I had palpitations and pulmonary emboli. I lost my son to COVID in 2022, also with PE.  I may be a mouth breather -not sure if I am still snoring".    Epworth sleepiness score: 10 /24. FSS at 20/63 points.    BMI: 24.6 kg/m   Neck Circumference: 15"    FINDINGS:   Sleep Summary:   Total Recording Time (hours, min): 5 hours 57 minutes      Total Sleep Time (hours, min): 5 hours 35 minutes               Percent REM (%): 12.4%     Sleep latency was 16 minutes and REM sleep latency was 34 minutes.  There were only 5 minutes of wakefulness after sleep onset.                                   Respiratory Indices by AASM criteria/by CMS criteria:   Calculated pAHI (per hour):   AASM AHI 4.7/h and following CMS guidelines the AHI is only 0.4/h                          REM pAHI:   CMS guidelines 1.5/h                                              NREM pAHI: CMS guidelines 0.2/h                            Positional AHI: This patient mostly slept nonsupine associated with a CMS AHI of 0.6/h  Snoring reached a mean volume of 41 dB and was present for about a quarter of  the total recorded sleep time.                                                 Oxygen Saturation Statistics:      O2 Saturation Range (%):     Between the nadir at 94 and a maximum saturation of 99% with a mean saturation of 96%.  O2 Saturation (minutes) <89%:     0 minutes      Pulse Rate Statistics:   Pulse Mean (bpm):    45 bpm             Pulse Range:   Between 36 and 67 bpm              IMPRESSION:  This HST did not detect the presence of sleep disordered breathing neither by AASM criteria nor by CMS criteria.  There is not even borderline hypoxemia noted the only finding that I found remarkable is the trend to bradycardia.    RECOMMENDATION: If Mrs. Sade feels that this does not represent a normal or regular night of sleep I would offer to invite her for an in lab sleep study instead.  Based on these data, I do not see any cause of concern or need for intervention.    I will refer her back to her primary care physician.    INTERPRETING PHYSICIAN:   Melvyn Novas, MD

## 2022-08-26 NOTE — Progress Notes (Signed)
Cardiology Clinic Note   Date: 08/28/2022 ID: Dayanis, Bergquist [birth year], MRN [id number]  Primary Cardiologist:  Reatha Harps, MD  Patient Profile    Elaine Luna is a 77 y.o. female who presents to the clinic today for follow up after testing.     Past medical history significant for: DOE. Echo 08/05/2022: EF 60 to 65%.  Grade I DD.  Normal RV function.  Mild MR. Bradycardia. 3 day ZIO 12/06/2018: Predominant rhythm was sinus bradycardia at 49 bpm.  No significant arrhythmias.  Rare PACs and PVCs. Hypertension. Hyperlipidemia. T2DM. Asthma. PE. CTA chest (PE protocol) 11/14/2018: Several tiny PE in segmental branches of the right upper lobe, right lower lobe, lingula, and left lower lobe.  Small clot burden.  No right heart strain.     History of Present Illness    Elaine Luna was evaluated by Dr. Flora Lipps on 11/11/2018 for bradycardia at the request of Dr. Everardo All. She reported palpitations and fluttering in chest that occurred daily. Her EKG showed sinus rhythm with frequent PVCs. It was suspected she was getting low HR readings with her monitor secondary to PVCs. 3 day zio showed sinus bradycardia with rare ectopy.  Before patient presented for follow-up she was seen in the ED and found to have pulmonary emboli and pneumonia.  It was decided to hold off on stress testing until she has time to recover from COVID, pneumonia, PE.  Started on diltiazem.  Patient underwent an exercise tolerance test April 2022 which was inconclusive secondary to poor exercise capacity and inability to meet target heart rate.  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 She has not been seen since.    Patient has had three ED visits this year. She presented to the ED on 4/20/024 for shortness of breath felt to be asthma  exacerbation. She presented to the [facility] ED on 06/10/2022 with hypertension with systolic in the 250s.  She was started on irbesartan.  Patient returned to the ED on 06/13/2018 3:24 days of taking irbesartan with BP 242/95.  BP came down with hydralazine and she was discharged.  Patient was last seen in the office by me on 07/03/2022 with complaints of elevated BP at home.  She reported BP as high as 190/80 and that sometimes her blood pressure cuff could not give her reading.  PCP had decreased amlodipine to 5 mg secondary to lightheadedness.  BP 2 weeks prior to visit typically 140/80.  Patient reported palpitations described as brief fluttering that resolved with rest.  She also reported DOE and shortness of breath at rest.  Prior to episodes of hypertension she [facility] [employer]  Chlorthalidone 12.5 mg was added.  Echo showed normal LV/RV function with Grade I DD.  Today, patient is doing well. Patient denies shortness of breath or dyspnea on exertion. No chest pain, pressure, or tightness. Denies lower extremity edema, orthopnea, or PND. She reports occasional, brief palpitations that she notices if she stands still for too long that resolve on their own. She stopped checking BP at home "I was checking it like every 30 minutes and driving myself crazy." She denies headaches, dizziness, or vision changes. She is having a hard time cutting chlorthalidone in half due to the tablet being so small.     ROS: All other  systems reviewed and are otherwise negative except as noted in History of Present Illness.  Studies Reviewed      EKG not ordered today.   Physical Exam    VS:  BP (!) 142/72 (BP Location: Left Arm, Patient Position: Sitting, Cuff Size: Normal)   Pulse (!) 52   Ht 5\' 4"  (1.626 m)   Wt 136 lb 6.4 oz (61.9 kg)   SpO2 97%   BMI 23.41 kg/m  , BMI Body mass index is 23.41 kg/m.  GEN: Well nourished, well developed, in no acute distress. Neck: No JVD or carotid  bruits. Cardiac:  RRR. No murmurs. No rubs or gallops.   Respiratory:  Respirations regular and unlabored. Clear to auscultation without rales, wheezing or rhonchi. GI: Soft, nontender, nondistended. Extremities: Radials/DP/PT 2+ and equal bilaterally. No clubbing or cyanosis. No edema.  Skin: Warm and dry, no rash. Neuro: Strength intact.  Assessment & Plan    Hypertension. BP today 142/72 on intake and 136/72 on my recheck.  Home BP has not been checked because she was "driving myself crazy" by checking it frequently. She feels it has been well controlled since the addition of chlorthalidone.  Patient denies headaches, dizziness, vision changes.  Continue irbesartan. She is having a hard time cutting chlorthalidone in half. Will increase chlorthalidone to 25 mg daily and decrease amlodipine to 2.5 mg. She is in agreement with plan.  Shortness of breath/DOE.  Echo July 2024 showed normal LV/RV function, Grade I DD.  Patient denies shortness of breath or DOE. Lungs are clear to auscultation on exam.  Palpitations/fluttering in chest.  Patient reports rare, brief episodes of palpitations that typically occur when she is standing still for too long and resolve on their own. Episodes are not bothersome to her. She will continue to monitor and contact the office if episodes become more frequent or prolonged.   Disposition: Increase chlorthalidone to 25 mg daily and decrease amlodipine 2.5 mg daily. Return in 6 months or sooner as needed.          Signed, Etta Grandchild. Ashaunti Treptow, DNP, NP-C

## 2022-08-28 ENCOUNTER — Ambulatory Visit: Payer: Medicare Other | Admitting: Student

## 2022-08-28 ENCOUNTER — Other Ambulatory Visit (HOSPITAL_BASED_OUTPATIENT_CLINIC_OR_DEPARTMENT_OTHER): Payer: Self-pay

## 2022-08-28 ENCOUNTER — Encounter: Payer: Self-pay | Admitting: Student

## 2022-08-28 VITALS — BP 136/72 | HR 52 | Ht 64.0 in | Wt 136.4 lb

## 2022-08-28 DIAGNOSIS — R0609 Other forms of dyspnea: Secondary | ICD-10-CM

## 2022-08-28 DIAGNOSIS — I10 Essential (primary) hypertension: Secondary | ICD-10-CM | POA: Diagnosis not present

## 2022-08-28 DIAGNOSIS — R0602 Shortness of breath: Secondary | ICD-10-CM | POA: Diagnosis not present

## 2022-08-28 DIAGNOSIS — R002 Palpitations: Secondary | ICD-10-CM | POA: Diagnosis not present

## 2022-08-28 MED ORDER — CHLORTHALIDONE 25 MG PO TABS
25.0000 mg | ORAL_TABLET | Freq: Every day | ORAL | 1 refills | Status: AC
  Filled 2022-08-28: qty 45, 45d supply, fill #0

## 2022-08-28 MED ORDER — AMLODIPINE BESYLATE 5 MG PO TABS
2.5000 mg | ORAL_TABLET | Freq: Every day | ORAL | 3 refills | Status: AC
  Filled 2022-08-28: qty 45, 90d supply, fill #0

## 2022-08-28 NOTE — Patient Instructions (Signed)
Medication Instructions:   You will increase the the Chlorthiladone 25mg  daily  Decrease th amlodipine to 2.5mg  tablets daily  *If you need a refill on your cardiac medications before your next appointment, please call your pharmacy*   Lab Work: none If you have labs (blood work) drawn today and your tests are completely normal, you will receive your results only by: MyChart Message (if you have MyChart) OR A paper copy in the mail If you have any lab test that is abnormal or we need to change your treatment, we will call you to review the results.   Testing/Procedures: none   Follow-Up: At [facility], you and your health needs are our priority.  As part of our continuing mission to provide you with exceptional heart care, we have created designated Provider Care Teams.  These Care Teams include your primary Cardiologist (physician) and Advanced Practice Providers (APPs -  Physician Assistants and Nurse Practitioners) who all work together to provide you with the care you need, when you need it.  We recommend signing up for the patient portal called "MyChart".  Sign up information is provided on this After Visit Summary.  MyChart is used to connect with patients for Virtual Visits (Telemedicine).  Patients are able to view lab/test results, encounter notes, upcoming appointments, etc.  Non-urgent messages can be sent to your provider as well.   To learn more about what you can do with MyChart, go to [email or web address].    Your next appointment:   6 month(s)  Provider:   Reatha Harps, MD

## 2022-08-28 NOTE — Progress Notes (Signed)
This HST did not detect the presence of sleep disordered breathing, neither by AASM criteria nor by CMS criteria.  There is not even borderline hypoxemia noted.  The only finding that I found remarkable is the trend to bradycardia.   This result does not correlate nor explain the peaks in BP/ HTN with sleep disordered breathing.  No follow up needed.

## 2022-08-28 NOTE — Procedures (Signed)
[facility]  Danie Binder, R.N.  77 year old female Jul 22, 1945   HOME SLEEP TEST REPORT ( by Watch PAT)   STUDY DATA:  08-27-2022   ORDERING CLINICIAN: Melvyn Novas, MD  REFERRING CLINICIAN: Dr Timothy Lasso, MD    CLINICAL INFORMATION/HISTORY: severe persistent asthma, primary HTN, post covid chronic cough, SOB. "seen upon referral  by Dr Timothy Lasso on 07/30/2022 for an evaluation of sleep apnea,as possible cause of sudden peaks in BP.  The patient also reports vivid dreams, a history of pulmonary emboli post-COVID waking up with a dry mouth and having morning headaches.  She sleeps between 8 and 10 hours each night.  Chief concern according to patient : " I  have always been a great sleeper, I was a snorer when I was much heavier- by 100 pounds. I lost weight after COVID , Smell and taste have not recovered. I had palpitations and pulmonary emboli. I lost my son to COVID in 2022, also with PE.  I may be a mouth breather -not sure if I am still snoring".    Epworth sleepiness score: 10 /24. FSS at 20/63 points.    BMI: 24.6 kg/m   Neck Circumference: 15"    FINDINGS:   Sleep Summary:   Total Recording Time (hours, min): 5 hours 57 minutes      Total Sleep Time (hours, min): 5 hours 35 minutes               Percent REM (%): 12.4%     Sleep latency was 16 minutes and REM sleep latency was 34 minutes.  There were only 5 minutes of wakefulness after sleep onset.                                   Respiratory Indices by AASM criteria/by CMS criteria:   Calculated pAHI (per hour):   AASM AHI 4.7/h and following CMS guidelines the AHI is only 0.4/h                          REM pAHI:   CMS guidelines 1.5/h                                              NREM pAHI: CMS guidelines 0.2/h                            Positional AHI: This patient mostly slept nonsupine associated with a CMS AHI of 0.6/h  Snoring reached a mean volume of 41 dB and was present for about a quarter of  the total recorded sleep time.                                                 Oxygen Saturation Statistics:      O2 Saturation Range (%):     Between the nadir at 94 and a maximum saturation of 99% with a mean saturation of 96%.  O2 Saturation (minutes) <89%:     0 minutes      Pulse Rate Statistics:   Pulse Mean (bpm):    45 bpm             Pulse Range:   Between 36 and 67 bpm              IMPRESSION:  This HST did not detect the presence of sleep disordered breathing neither by AASM criteria nor by CMS criteria.  There is not even borderline hypoxemia noted the only finding that I found remarkable is the trend to bradycardia.    RECOMMENDATION: If Mrs. Sade feels that this does not represent a normal or regular night of sleep I would offer to invite her for an in lab sleep study instead.  Based on these data, I do not see any cause of concern or need for intervention.    I will refer her back to her primary care physician.    INTERPRETING PHYSICIAN:   Melvyn Novas, MD

## 2022-08-31 ENCOUNTER — Encounter: Payer: Self-pay | Admitting: Neurology

## 2022-09-07 ENCOUNTER — Ambulatory Visit: Payer: Medicare Other | Admitting: Psychology

## 2022-09-21 ENCOUNTER — Ambulatory Visit: Payer: Medicare Other | Admitting: Psychology

## 2022-09-25 NOTE — Progress Notes (Signed)
[facility] - Clinic Note  09/29/2022   CHIEF COMPLAINT Patient presents for Retina Evaluation  HISTORY OF PRESENT ILLNESS: Elaine Luna is a 77 y.o. female who presents to the clinic today for:  HPI     Retina Evaluation   In right eye.  This started 5 days ago.  Duration of 5 days.  I, the attending physician,  performed the HPI with the patient and updated documentation appropriately.        Comments   Retina eval per Mathis Fare for decreased vision OD pt is reporting no vision changes noticed she denies any flashes or floaters       Last edited by Rennis Chris, MD on 10/01/2022  2:27 AM.    Pt is here on the referral of Alma Downs, PA-C for concern of decreased VA OD, pt saw him for a routine eye exam, she states it had been a year or 2 since she had an eye exam, pt states Dr. Hortense Ramal did her cataract sx about 10 years ago   Referring physician: [facility] [address] [place],  [place] [address]  HISTORICAL INFORMATION:  Selected notes from the MEDICAL RECORD NUMBER Referred by Alma Downs, PA-C for decreased vision OD LEE:  Ocular Hx- PMH-   CURRENT MEDICATIONS: No current outpatient medications on file. (Ophthalmic Drugs)   No current facility-administered medications for this visit. (Ophthalmic Drugs)   Current Outpatient Medications (Other)  Medication Sig   albuterol (VENTOLIN HFA) 108 (90 Base) MCG/ACT inhaler Inhale 2 puffs into the lungs every 6 (six) hours as needed. (Patient not taking: Reported on 08/28/2022)   ALPRAZolam (XANAX) 0.25 MG tablet Take 1 tablet (0.25 mg total) by mouth 2 (two) times daily as needed for anxiety.   amLODipine (NORVASC) 5 MG tablet Take 0.5 tablets (2.5 mg total) by mouth daily.   atorvastatin (LIPITOR) 20 MG tablet Take 1 tablet (20 mg total) by mouth daily. (Patient not taking: Reported on 08/28/2022)   budesonide-formoterol (SYMBICORT) 80-4.5 MCG/ACT inhaler Inhale 2  puffs into the lungs 2 (two) times daily. (Patient not taking: Reported on 08/28/2022)   chlorthalidone (HYGROTON) 25 MG tablet Take 1 tablet (25 mg total) by mouth daily.   Denosumab (PROLIA Rebecca) Inject into the skin every 6 (six) months.   irbesartan (AVAPRO) 300 MG tablet TAKE 1/2 TABLET BY MOUTH ONCE DAILY   pantoprazole (PROTONIX) 20 MG tablet Take 1 tablet (20 mg total) by mouth daily. (Patient not taking: Reported on 08/28/2022)   Vitamin D, Ergocalciferol, (DRISDOL) 1.25 MG (50000 UNIT) CAPS capsule Take 1 capsule (50,000 Units total) by mouth 2 (two) times a week.   No current facility-administered medications for this visit. (Other)   REVIEW OF SYSTEMS: ROS   Positive for: Cardiovascular, Eyes, Respiratory Last edited by Etheleen Mayhew, COT on 09/29/2022  2:12 PM.     ALLERGIES Allergies  Allergen Reactions   Ace Inhibitors     Other Reaction(s): Unknown   PAST MEDICAL HISTORY Past Medical History:  Diagnosis Date   Asthma    Colon polyps    DM type 2 (diabetes mellitus, type 2) (HCC) 2015    Hyperlipidemia    Hypertension    Obesity    Pulmonary embolism associated with COVID-19 (HCC) 12/25/2018   Tubular adenoma    Past Surgical History:  Procedure Laterality Date   APPENDECTOMY     CARDIAC CATHETERIZATION     CATARACT EXTRACTION, BILATERAL  2011   CHOLECYSTECTOMY     ESOPHAGOGASTRODUODENOSCOPY  2011   TONSILLECTOMY     VESICOVAGINAL FISTULA CLOSURE W/ TAH     FAMILY HISTORY Family History  Problem Relation Age of Onset   Heart disease Mother    Heart disease Father    Diabetes Brother    Heart disease Brother    Diabetes Maternal Grandmother    Diabetes Maternal Grandfather    Asthma Other        all children   Colon cancer Neg Hx    Esophageal cancer Neg Hx    Rectal cancer Neg Hx    Stomach cancer Neg Hx    SOCIAL HISTORY Social History   Tobacco Use   Smoking status: Former    Current packs/day: 0.00    Average packs/day: 0.3  packs/day for 10.0 years (3.0 ttl pk-yrs)    Types: Cigarettes    Start date: 68    Quit date: 02/03/1975    Years since quitting: 47.6   Smokeless tobacco: Never  Vaping Use   Vaping status: Never Used  Substance Use Topics   Alcohol use: No   Drug use: No       OPHTHALMIC EXAM:  Base Eye Exam     Visual Acuity (Snellen - Linear)       Right Left   Dist [place] 20/30 -3 20/20 -2   Dist ph [place] NI          Tonometry (Tonopen, 2:17 PM)       Right Left   Pressure 14 16         Pupils       Pupils Dark Light Shape React APD   Right PERRL 3 2 Round Brisk None   Left PERRL 3 2 Round Brisk None         Visual Fields       Left Right    Full Full         Extraocular Movement       Right Left    Full, Ortho Full, Ortho         Neuro/Psych     Oriented x3: Yes   Mood/Affect: Normal         Dilation     Both eyes: 2.5% Phenylephrine @ 2:17 PM           Slit Lamp and Fundus Exam     Slit Lamp Exam       Right Left   Lids/Lashes Dermatochalasis - upper lid Dermatochalasis - upper lid   Conjunctiva/Sclera White and quiet White and quiet   Cornea 2+ pigmented guttata, well healed cataract wound 2+ pigmented guttata, mild tear film debris, well healed cataract wound   Anterior Chamber deep and clear deep and clear   Iris Round and dilated Round and dilated   Lens PC IOL in good position, trace Posterior capsular opacification PC IOL in good position, 1+ non-central Posterior capsular opacification   Anterior Vitreous mild syneresis, Posterior vitreous detachment mild syneresis, Posterior vitreous detachment         Fundus Exam       Right Left   Disc Pink and Sharp, +PPP mild Pallor, Sharp rim, PPA/PPP   C/D Ratio 0.6 0.4   Macula Flat, Blunted foveal reflex, mild RPE mottling, rare drusen, No heme or edema Flat, Blunted foveal reflex, mild RPE mottling, mild ERM nasal macula, No heme or edema   Vessels attenuated, Tortuous attenuated,  mild tortuosity  Periphery Attached Attached           IMAGING AND PROCEDURES  Imaging and Procedures for 09/29/2022  OCT, Retina - OU - Both Eyes       Right Eye Quality was good. Central Foveal Thickness: 299. Progression has no prior data. Findings include normal foveal contour, no IRF, no SRF, retinal drusen .   Left Eye Quality was good. Central Foveal Thickness: 263. Progression has no prior data. Findings include normal foveal contour, no IRF, no SRF, epiretinal membrane, macular pucker (ERM with pucker nasal and inferior macula).   Notes *Images captured and stored on drive  Diagnosis / Impression:  OD: NFP, no IRF/SRF OS: ERM with pucker nasal and inferior macula  Clinical management:  See below  Abbreviations: NFP - Normal foveal profile. CME - cystoid macular edema. PED - pigment epithelial detachment. IRF - intraretinal fluid. SRF - subretinal fluid. EZ - ellipsoid zone. ERM - epiretinal membrane. ORA - outer retinal atrophy. ORT - outer retinal tubulation. SRHM - subretinal hyper-reflective material. IRHM - intraretinal hyper-reflective material           ASSESSMENT/PLAN:   ICD-10-CM   1. Epiretinal membrane (ERM) of left eye  H35.372 OCT, Retina - OU - Both Eyes    2. Essential hypertension  I10     3. Hypertensive retinopathy of both eyes  H35.033     4. Pseudophakia, both eyes  Z96.1      **Pt referred for unexplained vision loss OD**  - pt reports minimal decrease in vision OD  - BCVA OD at [facility] was 20/50 on 08.22.24  - BCVA OD here 20/30 w/o any intervention  - no pathologic findings noted on dilated exam and imaging to explain decreased vision OD, but also vision already improved today  - monitor  Epiretinal membrane, left eye  - The natural history, anatomy, potential for loss of vision, and treatment options including vitrectomy techniques and the complications of endophthalmitis, retinal detachment, vitreous hemorrhage, cataract  progression and permanent vision loss discussed with the patient. - mild ERM - BCVA 20/20 - asymptomatic, no metamorphopsia - no indication for surgery at this time - monitor for now - f/u 6 mos -- DFE/OCT  2,3. Hypertensive retinopathy OU - discussed importance of tight BP control - monitor  4.Pseudophakia OU  - s/p CE/IOL OU (Dr. Hortense Ramal)  - IOLs in good position, doing well  - monitor  Ophthalmic Meds Ordered this visit:  No orders of the defined types were placed in this encounter.    Return in about 6 months (around 04/01/2023) for f/u ERM OS, DFE, OCT.  There are no Patient Instructions on file for this visit.  Explained the diagnoses, plan, and follow up with the patient and they expressed understanding.  Patient expressed understanding of the importance of proper follow up care.   This document serves as a record of services personally performed by Karie Chimera, MD, PhD. It was created on their behalf by Laurey Morale, COT an ophthalmic technician. The creation of this record is the provider's dictation and/or activities during the visit.    Electronically signed by:  Charlette Caffey, COT  10/01/22 2:29 AM  This document serves as a record of services personally performed by Karie Chimera, MD, PhD. It was created on their behalf by Glee Arvin. Manson Passey, OA an ophthalmic technician. The creation of this record is the provider's dictation and/or activities during the visit.    Electronically signed  by: Glee Arvin Manson Passey, OA 10/01/22 2:29 AM  Karie Chimera, M.D., Ph.D. Diseases & Surgery of the Retina and Vitreous [facility] 09/29/2022  I have reviewed the above documentation for accuracy and completeness, and I agree with the above. Karie Chimera, M.D., Ph.D. 10/01/22 2:33 AM   Abbreviations: M myopia (nearsighted); A astigmatism; H hyperopia (farsighted); P presbyopia; Mrx spectacle prescription;  CTL contact lenses; OD right eye; OS  left eye; OU both eyes  XT exotropia; ET esotropia; PEK punctate epithelial keratitis; PEE punctate epithelial erosions; DES dry eye syndrome; MGD meibomian gland dysfunction; ATs artificial tears; PFAT's preservative free artificial tears; NSC nuclear sclerotic cataract; PSC posterior subcapsular cataract; ERM epi-retinal membrane; PVD posterior vitreous detachment; RD retinal detachment; DM diabetes mellitus; DR diabetic retinopathy; NPDR non-proliferative diabetic retinopathy; PDR proliferative diabetic retinopathy; CSME clinically significant macular edema; DME diabetic macular edema; dbh dot blot hemorrhages; CWS cotton wool spot; POAG primary open angle glaucoma; C/D cup-to-disc ratio; HVF humphrey visual field; GVF goldmann visual field; OCT optical coherence tomography; IOP intraocular pressure; BRVO Branch retinal vein occlusion; CRVO central retinal vein occlusion; CRAO central retinal artery occlusion; BRAO branch retinal artery occlusion; RT retinal tear; SB scleral buckle; PPV pars plana vitrectomy; VH Vitreous hemorrhage; PRP panretinal laser photocoagulation; IVK intravitreal kenalog; VMT vitreomacular traction; MH Macular hole;  NVD neovascularization of the disc; NVE neovascularization elsewhere; AREDS age related eye disease study; ARMD age related macular degeneration; POAG primary open angle glaucoma; EBMD epithelial/anterior basement membrane dystrophy; ACIOL anterior chamber intraocular lens; IOL intraocular lens; PCIOL posterior chamber intraocular lens; Phaco/IOL phacoemulsification with intraocular lens placement; PRK photorefractive keratectomy; LASIK laser assisted in situ keratomileusis; HTN hypertension; DM diabetes mellitus; COPD chronic obstructive pulmonary disease

## 2022-09-29 ENCOUNTER — Ambulatory Visit (INDEPENDENT_AMBULATORY_CARE_PROVIDER_SITE_OTHER): Payer: Medicare Other | Admitting: Ophthalmology

## 2022-09-29 ENCOUNTER — Encounter (INDEPENDENT_AMBULATORY_CARE_PROVIDER_SITE_OTHER): Payer: Self-pay | Admitting: Ophthalmology

## 2022-09-29 DIAGNOSIS — H35372 Puckering of macula, left eye: Secondary | ICD-10-CM | POA: Diagnosis not present

## 2022-09-29 DIAGNOSIS — I10 Essential (primary) hypertension: Secondary | ICD-10-CM

## 2022-09-29 DIAGNOSIS — Z961 Presence of intraocular lens: Secondary | ICD-10-CM

## 2022-09-29 DIAGNOSIS — H35033 Hypertensive retinopathy, bilateral: Secondary | ICD-10-CM

## 2022-09-29 DIAGNOSIS — H3581 Retinal edema: Secondary | ICD-10-CM

## 2022-10-01 ENCOUNTER — Encounter (INDEPENDENT_AMBULATORY_CARE_PROVIDER_SITE_OTHER): Payer: Self-pay | Admitting: Ophthalmology

## 2022-10-07 ENCOUNTER — Other Ambulatory Visit (HOSPITAL_COMMUNITY)
Admission: RE | Admit: 2022-10-07 | Discharge: 2022-10-07 | Disposition: A | Discharge status: Home / Self Care | Payer: Medicare Other | Source: Ambulatory Visit | Attending: Internal Medicine | Admitting: Internal Medicine

## 2022-10-07 ENCOUNTER — Ambulatory Visit
Admission: RE | Admit: 2022-10-07 | Discharge: 2022-10-07 | Disposition: A | Discharge status: Home / Self Care | Payer: Medicare Other | Source: Ambulatory Visit | Attending: Internal Medicine | Admitting: Internal Medicine

## 2022-10-07 DIAGNOSIS — E041 Nontoxic single thyroid nodule: Secondary | ICD-10-CM | POA: Insufficient documentation

## 2022-10-09 LAB — CYTOLOGY - NON PAP

## 2022-10-19 ENCOUNTER — Ambulatory Visit (INDEPENDENT_AMBULATORY_CARE_PROVIDER_SITE_OTHER): Payer: Medicare Other | Admitting: Psychology

## 2022-10-19 DIAGNOSIS — F4323 Adjustment disorder with mixed anxiety and depressed mood: Secondary | ICD-10-CM

## 2022-10-19 NOTE — Progress Notes (Signed)
                                                               Date: 10/19/2022  Diagnosis Adj. Disorder with anxiety and depression Symptoms unspecified Medication Status compliance  Safety none  If Suicidal or Homicidal State Action Taken: unspecified  Current Risk: low Medications unspecified Objectives unspecified Client Response full compliance  Service Location Location, [address] [place], [place] [address]  Service Code cpt [phone]  Normalize/Reframe  Facilitate problem solving  Validate/empathize  Distress tolerance skill  Emotion regulation skills  Self care activities  Self-monitoring  Mindfulness training  Session Notes:  Dx: Adjustment Disorder with Anxiety and Depression  Meds: [place] for asthma, zolair (injection), blood pressure Meds. No psychotropic.  Goals: Patient is looking to reduce symptoms of anxiety and depression. Needs help adjusting to retirement. Motivation and energy is very low. Individual therapy to define stategies to reduce symptoms. Goal date is 12-23. Revised goal date is 12-24  Patient agrees to a video Public affairs consultant) session and is aware of the platform limitations. She is at home and I am in my home office. Hortencia: She says she had a cardiac work up because of hypertension. All checked out okay. She also had neuro and sleep work up and it has all been normal. She has a trip to [place] scheduled at end of October with grandson and another trip in couple of weeks to the beach with girlfriends. Her grandson is the son of the son she had that committed suicide. Her anxiety is triggered by being alone and feeling vulnerable.  She says she needs to be productive and still plans to volunteer. She says she will do that when she gets back from [place]. She has already arranged for her volunteer activity. Just needs some shots and she can schedule. She is excited to get  started. Will get her Covid 19 and Flu shots before next appointment.                                                                                  Garrel Ridgel, PhD 1:15p-2:00p 50 minutes

## 2022-10-27 ENCOUNTER — Other Ambulatory Visit (HOSPITAL_BASED_OUTPATIENT_CLINIC_OR_DEPARTMENT_OTHER): Payer: Self-pay

## 2022-10-27 MED ORDER — COMIRNATY 30 MCG/0.3ML IM SUSY
0.3000 mL | PREFILLED_SYRINGE | Freq: Once | INTRAMUSCULAR | 0 refills | Status: AC
  Filled 2022-10-27: qty 0.3, 1d supply, fill #0

## 2022-11-02 ENCOUNTER — Ambulatory Visit: Payer: Medicare Other | Admitting: Psychology

## 2022-11-12 ENCOUNTER — Other Ambulatory Visit (HOSPITAL_BASED_OUTPATIENT_CLINIC_OR_DEPARTMENT_OTHER): Payer: Self-pay

## 2022-11-12 MED ORDER — INFLUENZA VAC A&B SURF ANT ADJ 0.5 ML IM SUSY
0.5000 mL | PREFILLED_SYRINGE | Freq: Once | INTRAMUSCULAR | 0 refills | Status: AC
  Filled 2022-11-12: qty 0.5, 1d supply, fill #0

## 2022-11-16 ENCOUNTER — Ambulatory Visit (INDEPENDENT_AMBULATORY_CARE_PROVIDER_SITE_OTHER): Payer: Medicare Other | Admitting: Psychology

## 2022-11-16 DIAGNOSIS — F4323 Adjustment disorder with mixed anxiety and depressed mood: Secondary | ICD-10-CM | POA: Diagnosis not present

## 2022-11-16 NOTE — Progress Notes (Signed)
                                                                              Date: 11/16/2022  Diagnosis Adj. Disorder with anxiety and depression Symptoms unspecified Medication Status compliance  Safety none  If Suicidal or Homicidal State Action Taken: unspecified  Current Risk: low Medications unspecified Objectives unspecified Client Response full compliance  Service Location Location, [address] [place], [place] [address]  Service Code cpt [phone]  Normalize/Reframe  Facilitate problem solving  Validate/empathize  Distress tolerance skill  Emotion regulation skills  Self care activities  Self-monitoring  Mindfulness training  Session Notes:  Dx: Adjustment Disorder with Anxiety and Depression  Meds: [place] for asthma, zolair (injection), blood pressure Meds. No psychotropic.  Goals: Patient is looking to reduce symptoms of anxiety and depression. Needs help adjusting to retirement. Motivation and energy is very low. Individual therapy to define stategies to reduce symptoms. Goal date is 12-23. Revised goal date is 12-24  Patient agrees to a video Public affairs consultant) session and is aware of the platform limitations. She is at home and I am in my home office. Jaylenne: She states that she is going to [place] with her grandson and then will have a conference. She continues to be worried and anxious about her mental health. This is associated with her chronic anxiety. She thinks she might need to have someone take care of her to manage day to day activities of living. When asked, however, she says she is taking care of all of those items. It bothers her that she is this anxious, especially given she recognizes how fortunate she has been in her life. She worries "about everything", which she never did in the past. We talked about her fears of taking the Xanax that has been prescribed (.25mg ). She  hasn't taken it, but will try one to see have it affects her. She is thinking about moving close to or moving in with her daughter. States she is feeling releived after having talked.                                                                                      Garrel Ridgel, PhD 1:15p-2:00p 50 minutes

## 2022-11-30 ENCOUNTER — Ambulatory Visit: Payer: Medicare Other | Admitting: Psychology

## 2022-12-14 ENCOUNTER — Ambulatory Visit: Payer: Medicare Other | Admitting: Psychology

## 2022-12-28 ENCOUNTER — Ambulatory Visit: Payer: Medicare Other | Admitting: Psychology

## 2023-01-11 ENCOUNTER — Ambulatory Visit: Payer: Medicare Other | Admitting: Psychology

## 2023-01-11 DIAGNOSIS — F4323 Adjustment disorder with mixed anxiety and depressed mood: Secondary | ICD-10-CM

## 2023-01-11 NOTE — Progress Notes (Signed)
Date: 01/11/2023  Diagnosis Adj. Disorder with anxiety and depression Symptoms unspecified Medication Status compliance  Safety none  If Suicidal or Homicidal State Action Taken: unspecified  Current Risk: low Medications unspecified Objectives unspecified Client Response full compliance  Service Location Location, [address] [place], [place] [address]                                                                                                                                                                                                                                                                       Service Code cpt [phone]  Normalize/Reframe  Facilitate problem solving  Validate/empathize  Distress tolerance skill  Emotion regulation skills  Self care activities  Self-monitoring  Mindfulness training  Session Notes:  Dx: Adjustment Disorder with Anxiety and Depression  Meds: [place] for asthma, zolair (injection), blood pressure Meds. No psychotropic.  Goals: Patient is looking to reduce symptoms of anxiety and depression. Needs help adjusting to retirement. Motivation and energy is very low. Individual therapy to define stategies to reduce symptoms. Goal date is 12-23. Revised goal date is 12-25  Patient agrees to a video Public affairs consultant) session and is aware of the platform limitations. She is at home and I am in my home office. Derrick: She states that she feels she is deteriorating. Says every morning she wakes up feeling insecure and doubting all of her decisions. She claims nothing has happened and she is managing life okay, but lacks confidence in herself. There are no behaviors to support her fears or lack of confidence. She has been traveling extensively both abroad and  domestically. Going back to [place] for the holidays (17th-9th). She is "lonesome all the time" and her daughter  has requested to have her come live with them. Loves her son in law, who calls her 3 times a day. She "lives in fear" that she will die in her house and will not be found for days. We discussed the need to maintain a gratitude list that she reviews every morning when feeling insecure. She realizes that she has many things in her life for which to feel grateful. She is keeping a journal and will use that for her gratitude list.  Garrel Ridgel, PhD 1:10p-2:00p 50 minutes

## 2023-01-18 ENCOUNTER — Other Ambulatory Visit (HOSPITAL_BASED_OUTPATIENT_CLINIC_OR_DEPARTMENT_OTHER): Payer: Self-pay

## 2023-01-21 ENCOUNTER — Other Ambulatory Visit: Payer: Self-pay

## 2023-01-25 ENCOUNTER — Ambulatory Visit: Payer: Medicare Other | Admitting: Psychology

## 2023-02-08 ENCOUNTER — Ambulatory Visit: Payer: Medicare Other | Admitting: Psychology

## 2023-02-15 ENCOUNTER — Other Ambulatory Visit (HOSPITAL_BASED_OUTPATIENT_CLINIC_OR_DEPARTMENT_OTHER): Payer: Self-pay

## 2023-02-22 ENCOUNTER — Ambulatory Visit: Payer: Medicare Other | Admitting: Psychology

## 2023-02-22 DIAGNOSIS — F4323 Adjustment disorder with mixed anxiety and depressed mood: Secondary | ICD-10-CM

## 2023-02-22 NOTE — Progress Notes (Signed)
Date: 02/22/2023  Diagnosis Adj. Disorder with anxiety and depression Symptoms unspecified Medication Status compliance  Safety none  If Suicidal or Homicidal State Action Taken: unspecified  Current Risk: low Medications unspecified Objectives unspecified Client Response full compliance  Service Location Location, [address] [place], [place] [address]                                                                                                                                                                                                                                                                       Service Code cpt [phone]  Normalize/Reframe  Facilitate problem solving  Validate/empathize  Distress tolerance skill  Emotion regulation skills  Self care activities  Self-monitoring  Mindfulness training  Session Notes:  Dx: Adjustment Disorder with Anxiety and Depression  Meds: [place] for asthma, zolair (injection), blood pressure Meds. No psychotropic.  Goals: Patient is looking to reduce symptoms of anxiety and depression. Needs help adjusting to retirement. Motivation and energy is very low. Individual therapy to define stategies to reduce symptoms. Goal date is 12-23. Revised goal date is 12-25  Patient agrees to a video Public affairs consultant) session and is aware of the platform limitations. She is at home and I am in my home office.  Stayce: She states that she is still having the same "irrational" fears. We talked about using cognitive strategies to manage her fears. She is fearful of taking Xanax because of it's addictive qualities. She says the only reason she hasn't moved to her daughter's home is that they are living [place] a farm rent free. She is  spending her time reading  and trying to stay busy.  Garrel Ridgel, PhD 1:10p-2:00p 50 minutes

## 2023-02-25 ENCOUNTER — Other Ambulatory Visit (HOSPITAL_BASED_OUTPATIENT_CLINIC_OR_DEPARTMENT_OTHER): Payer: Self-pay

## 2023-02-25 MED ORDER — ATORVASTATIN CALCIUM 20 MG PO TABS
20.0000 mg | ORAL_TABLET | Freq: Every day | ORAL | 3 refills | Status: AC
  Filled 2023-02-25: qty 90, 90d supply, fill #0

## 2023-02-25 MED ORDER — IRBESARTAN 300 MG PO TABS
150.0000 mg | ORAL_TABLET | Freq: Every day | ORAL | 3 refills | Status: DC
  Filled 2023-02-25: qty 45, 90d supply, fill #0

## 2023-02-25 MED ORDER — VITAMIN D (ERGOCALCIFEROL) 1.25 MG (50000 UNIT) PO CAPS
50000.0000 [IU] | ORAL_CAPSULE | ORAL | 3 refills | Status: AC
  Filled 2023-02-25: qty 24, 84d supply, fill #0

## 2023-02-25 MED ORDER — AMLODIPINE BESYLATE 5 MG PO TABS
5.0000 mg | ORAL_TABLET | Freq: Every day | ORAL | 3 refills | Status: DC
  Filled 2023-02-25: qty 90, 90d supply, fill #0

## 2023-02-25 MED ORDER — PANTOPRAZOLE SODIUM 20 MG PO TBEC
20.0000 mg | DELAYED_RELEASE_TABLET | Freq: Every day | ORAL | 3 refills | Status: DC
  Filled 2023-02-25: qty 90, 90d supply, fill #0

## 2023-02-26 ENCOUNTER — Other Ambulatory Visit (HOSPITAL_BASED_OUTPATIENT_CLINIC_OR_DEPARTMENT_OTHER): Payer: Self-pay

## 2023-02-26 ENCOUNTER — Other Ambulatory Visit: Payer: Self-pay

## 2023-02-26 MED ORDER — IRBESARTAN 150 MG PO TABS
150.0000 mg | ORAL_TABLET | Freq: Every day | ORAL | 3 refills | Status: AC
  Filled 2023-02-26: qty 90, 90d supply, fill #0

## 2023-02-26 MED ORDER — ATORVASTATIN CALCIUM 20 MG PO TABS
20.0000 mg | ORAL_TABLET | Freq: Every day | ORAL | 3 refills | Status: DC
  Filled 2023-02-26: qty 90, 90d supply, fill #0

## 2023-02-26 MED ORDER — AMLODIPINE BESYLATE 5 MG PO TABS
5.0000 mg | ORAL_TABLET | Freq: Every day | ORAL | 3 refills | Status: DC
  Filled 2023-02-26: qty 90, 90d supply, fill #0

## 2023-02-26 MED ORDER — PANTOPRAZOLE SODIUM 20 MG PO TBEC
20.0000 mg | DELAYED_RELEASE_TABLET | Freq: Every day | ORAL | 0 refills | Status: DC
  Filled 2023-02-26: qty 90, 90d supply, fill #0

## 2023-02-26 MED ORDER — VITAMIN D (ERGOCALCIFEROL) 1.25 MG (50000 UNIT) PO CAPS
50000.0000 [IU] | ORAL_CAPSULE | ORAL | 3 refills | Status: DC
  Filled 2023-02-26: qty 24, 84d supply, fill #0

## 2023-03-08 ENCOUNTER — Ambulatory Visit (INDEPENDENT_AMBULATORY_CARE_PROVIDER_SITE_OTHER): Payer: Medicare Other | Admitting: Psychology

## 2023-03-08 DIAGNOSIS — F4323 Adjustment disorder with mixed anxiety and depressed mood: Secondary | ICD-10-CM | POA: Diagnosis not present

## 2023-03-08 NOTE — Progress Notes (Signed)
Date: 03/08/2023  Diagnosis Adj. Disorder with anxiety and depression Symptoms unspecified Medication Status compliance  Safety none  If Suicidal or Homicidal State Action Taken: unspecified  Current Risk: low Medications unspecified Objectives unspecified Client Response full compliance  Service Location Location, [address] [place], [place] [address]                                                                                                                                                                                                                                                                       Service Code cpt [phone]  Normalize/Reframe  Facilitate problem solving  Validate/empathize  Distress tolerance skill  Emotion regulation skills  Self care activities  Self-monitoring  Mindfulness training  Session Notes:  Dx: Adjustment Disorder with Anxiety and Depression  Meds: [place]ast for asthma, zolair (injection), blood pressure Meds. No psychotropic.  Goals: Patient is looking to reduce symptoms of anxiety and depression. Needs help adjusting to retirement. Motivation and energy is very low. Individual therapy to define stategies to reduce symptoms. Goal date is 12-23. Revised goal date is 12-25  Patient agrees to a video Public affairs consultant) session and is aware of the platform limitations. She is at home and I am in my home office.  [name]: She is still experiencing anxiety, but has not tried the Xanax because she has fears. We explored her fears and she says that she will try the medication. She also reports that she is starting to feel some depression secondary to the anxiety. Starting this week,  she will be starting some  of her volunteer activities. She is looking forward to these various activities. She states that her anxiety is worse when along and manageable when around other people. She says she got to see her great grandson's first steps and his 1st birthday. She holds on to the amazement of the technology. We discussed the importance of positive self-talk and to think of being curious rather than anxious.        Garrel Ridgel, PhD 1:10p-2:00p 50 minutes

## 2023-03-15 ENCOUNTER — Other Ambulatory Visit: Payer: Self-pay | Admitting: Internal Medicine

## 2023-03-15 DIAGNOSIS — Z1231 Encounter for screening mammogram for malignant neoplasm of breast: Secondary | ICD-10-CM

## 2023-03-17 NOTE — Progress Notes (Signed)
 [facility] - Clinic Note  03/30/2023   CHIEF COMPLAINT Patient presents for Retina Follow Up  HISTORY OF PRESENT ILLNESS: Elaine Luna is a 78 y.o. female who presents to the clinic today for:  HPI     Retina Follow Up   In left eye.  This started 6 months ago.  Duration of months.  Since onset it is stable.  I, the attending physician,  performed the HPI with the patient and updated documentation appropriately.        Comments   6 month retina follow up ERM OS pt is reporting vision may not be as sharp she denies any flashes or floaters pr states she has been having more eye irritation       Last edited by Rennis Chris, MD on 03/30/2023 12:44 PM.    Pt states her eyes are irritated in the morning when she wakes up, she is not using any drops    Referring physician: Alma Downs, PA-C  [facility] [address] [place],  [place] [address]  HISTORICAL INFORMATION:  Selected notes from the MEDICAL RECORD NUMBER Referred by Alma Downs, PA-C for decreased vision OD LEE:  Ocular Hx- PMH-   CURRENT MEDICATIONS: No current outpatient medications on file. (Ophthalmic Drugs)   No current facility-administered medications for this visit. (Ophthalmic Drugs)   Current Outpatient Medications (Other)  Medication Sig   albuterol (VENTOLIN HFA) 108 (90 Base) MCG/ACT inhaler Inhale 2 puffs into the lungs every 6 (six) hours as needed. (Patient not taking: Reported on 08/28/2022)   ALPRAZolam (XANAX) 0.25 MG tablet Take 1 tablet (0.25 mg total) by mouth 2 (two) times daily as needed for anxiety.   amLODipine (NORVASC) 5 MG tablet Take 0.5 tablets (2.5 mg total) by mouth daily.   amLODipine (NORVASC) 5 MG tablet Take 1 tablet (5 mg total) by mouth daily.   amLODipine (NORVASC) 5 MG tablet Take 1 tablet (5 mg total) by mouth daily as directed.   atorvastatin (LIPITOR) 20 MG tablet Take 1 tablet (20 mg total) by mouth daily.    atorvastatin (LIPITOR) 20 MG tablet Take 1 tablet (20 mg total) by mouth daily.   atorvastatin (LIPITOR) 20 MG tablet Take 1 tablet (20 mg total) by mouth daily.   budesonide-formoterol (SYMBICORT) 80-4.5 MCG/ACT inhaler Inhale 2 puffs into the lungs 2 (two) times daily. (Patient not taking: Reported on 08/28/2022)   chlorthalidone (HYGROTON) 25 MG tablet Take 1 tablet (25 mg total) by mouth daily.   Denosumab (PROLIA [place]) Inject into the skin every 6 (six) months.   irbesartan (AVAPRO) 150 MG tablet Take 1 tablet (150 mg total) by mouth daily.   irbesartan (AVAPRO) 300 MG tablet TAKE 1/2 TABLET BY MOUTH ONCE DAILY   irbesartan (AVAPRO) 300 MG tablet Take 0.5 tablets (150 mg total) by mouth daily.   pantoprazole (PROTONIX) 20 MG tablet Take 1 tablet (20 mg total) by mouth daily. (Patient not taking: Reported on 08/28/2022)   pantoprazole (PROTONIX) 20 MG tablet Take 1 tablet (20 mg total) by mouth daily.   pantoprazole (PROTONIX) 20 MG tablet Take 1 tablet (20 mg total) by mouth daily.   Vitamin D, Ergocalciferol, (DRISDOL) 1.25 MG (50000 UNIT) CAPS capsule Take 1 capsule (50,000 Units total) by mouth 2 (two) times a week.   Vitamin D, Ergocalciferol, (DRISDOL) 1.25 MG (50000 UNIT) CAPS capsule Take 1 capsule (50,000 Units total) by mouth 2 (two) times a week.  No current facility-administered medications for this visit. (Other)   REVIEW OF SYSTEMS: ROS   Positive for: Cardiovascular, Eyes, Respiratory Last edited by Etheleen Mayhew, COT on 03/30/2023  9:57 AM.      ALLERGIES Allergies  Allergen Reactions   Ace Inhibitors     Other Reaction(s): Unknown   PAST MEDICAL HISTORY Past Medical History:  Diagnosis Date   Asthma    Colon polyps    DM type 2 (diabetes mellitus, type 2) (HCC) 2015    Hyperlipidemia    Hypertension    Obesity    Pulmonary embolism associated with COVID-19 (HCC) 12/25/2018   Tubular adenoma    Past Surgical History:  Procedure Laterality Date    APPENDECTOMY     CARDIAC CATHETERIZATION     CATARACT EXTRACTION, BILATERAL  2011   CHOLECYSTECTOMY     ESOPHAGOGASTRODUODENOSCOPY  2011   TONSILLECTOMY     VESICOVAGINAL FISTULA CLOSURE W/ TAH     FAMILY HISTORY Family History  Problem Relation Age of Onset   Heart disease Mother    Heart disease Father    Diabetes Brother    Heart disease Brother    Diabetes Maternal Grandmother    Diabetes Maternal Grandfather    Asthma Other        all children   Colon cancer Neg Hx    Esophageal cancer Neg Hx    Rectal cancer Neg Hx    Stomach cancer Neg Hx    SOCIAL HISTORY Social History   Tobacco Use   Smoking status: Former    Current packs/day: 0.00    Average packs/day: 0.3 packs/day for 10.0 years (3.0 ttl pk-yrs)    Types: Cigarettes    Start date: 65    Quit date: 02/03/1975    Years since quitting: 48.1   Smokeless tobacco: Never  Vaping Use   Vaping status: Never Used  Substance Use Topics   Alcohol use: No   Drug use: No       OPHTHALMIC EXAM:  Base Eye Exam     Visual Acuity (Snellen - Linear)       Right Left   Dist [place] 20/30 20/25   Dist ph  20/25 NI         Tonometry (Tonopen, 10:04 AM)       Right Left   Pressure 15 14         Pupils       Pupils Dark Light Shape React APD   Right PERRL 3 2 Round Brisk None   Left PERRL 3 2 Round Brisk None         Visual Fields       Left Right    Full Full         Extraocular Movement       Right Left    Full, Ortho Full, Ortho         Neuro/Psych     Oriented x3: Yes   Mood/Affect: Normal         Dilation     Both eyes: 2.5% Phenylephrine @ 10:04 AM           Slit Lamp and Fundus Exam     Slit Lamp Exam       Right Left   Lids/Lashes Dermatochalasis - upper lid Dermatochalasis - upper lid   Conjunctiva/Sclera White and quiet White and quiet   Cornea 2+ pigmented guttata, well healed cataract wound, mild arcus, mild EBMD 1-2+ pigmented guttata, well  healed  cataract wound, mild arcus, mild EBMD   Anterior Chamber deep and clear deep and clear   Iris Round and moderately dilated Round and dilated   Lens PC IOL in good position, trace Posterior capsular opacification PC IOL in good position, 1+ non-central Posterior capsular opacification   Anterior Vitreous mild syneresis, Posterior vitreous detachment mild syneresis, Posterior vitreous detachment         Fundus Exam       Right Left   Disc Pink and Sharp, +PPP mild Pallor, Sharp rim, PPA/PPP   C/D Ratio 0.6 0.4   Macula Flat, Blunted foveal reflex, mild RPE mottling, rare drusen, No heme or edema Flat, Blunted foveal reflex, mild RPE mottling, mild ERM nasal macula, No heme or edema   Vessels mild attenuation, mild tortuosity attenuated, mild tortuosity   Periphery Attached, No heme Attached, No heme           IMAGING AND PROCEDURES  Imaging and Procedures for 03/30/2023  OCT, Retina - OU - Both Eyes       Right Eye Quality was good. Central Foveal Thickness: 297. Progression has been stable. Findings include normal foveal contour, no IRF, no SRF, retinal drusen .   Left Eye Quality was good. Central Foveal Thickness: 261. Progression has been stable. Findings include normal foveal contour, no IRF, no SRF, epiretinal membrane, macular pucker (Mild ERM with pucker nasal and inferior macula).   Notes *Images captured and stored on drive  Diagnosis / Impression:  OD: NFP, no IRF/SRF OS: mild ERM with pucker nasal and inferior macula  Clinical management:  See below  Abbreviations: NFP - Normal foveal profile. CME - cystoid macular edema. PED - pigment epithelial detachment. IRF - intraretinal fluid. SRF - subretinal fluid. EZ - ellipsoid zone. ERM - epiretinal membrane. ORA - outer retinal atrophy. ORT - outer retinal tubulation. SRHM - subretinal hyper-reflective material. IRHM - intraretinal hyper-reflective material           ASSESSMENT/PLAN:   ICD-10-CM   1.  Epiretinal membrane (ERM) of left eye  H35.372 OCT, Retina - OU - Both Eyes    2. Essential hypertension  I10     3. Hypertensive retinopathy of both eyes  H35.033     4. Pseudophakia, both eyes  Z96.1      Epiretinal membrane, left eye  - mild ERM w/ pucker nasal macula - stable - BCVA 20/20 - asymptomatic, no metamorphopsia - no indication for surgery at this time - monitor for now - f/u 9 mos -- DFE/OCT  2,3. Hypertensive retinopathy OU - discussed importance of tight BP control - monitor  4.Pseudophakia OU  - s/p CE/IOL OU (Dr. Hortense Ramal)  - IOLs in good position, doing well  - monitor  Ophthalmic Meds Ordered this visit:  No orders of the defined types were placed in this encounter.    Return in about 9 months (around 12/28/2023) for f/u ERM OS, DFE, OCT.  There are no Patient Instructions on file for this visit.  Explained the diagnoses, plan, and follow up with the patient and they expressed understanding.  Patient expressed understanding of the importance of proper follow up care.   This document serves as a record of services personally performed by Karie Chimera, MD, PhD. It was created on their behalf by Laurey Morale, COT an ophthalmic technician. The creation of this record is the provider's dictation and/or activities during the visit.    Electronically signed by:  Charlette Caffey,  COT  03/30/23 12:44 PM  This document serves as a record of services personally performed by Karie Chimera, MD, PhD. It was created on their behalf by Glee Arvin. Manson Passey, OA an ophthalmic technician. The creation of this record is the provider's dictation and/or activities during the visit.    Electronically signed by: Glee Arvin. Manson Passey, OA 03/30/23 12:44 PM  Karie Chimera, M.D., Ph.D. Diseases & Surgery of the Retina and Vitreous [facility] 03/30/2023  I have reviewed the above documentation for accuracy and completeness, and I agree with the  above. Karie Chimera, M.D., Ph.D. 03/30/23 12:45 PM   Abbreviations: M myopia (nearsighted); A astigmatism; H hyperopia (farsighted); P presbyopia; Mrx spectacle prescription;  CTL contact lenses; OD right eye; OS left eye; OU both eyes  XT exotropia; ET esotropia; PEK punctate epithelial keratitis; PEE punctate epithelial erosions; DES dry eye syndrome; MGD meibomian gland dysfunction; ATs artificial tears; PFAT's preservative free artificial tears; NSC nuclear sclerotic cataract; PSC posterior subcapsular cataract; ERM epi-retinal membrane; PVD posterior vitreous detachment; RD retinal detachment; DM diabetes mellitus; DR diabetic retinopathy; NPDR non-proliferative diabetic retinopathy; PDR proliferative diabetic retinopathy; CSME clinically significant macular edema; DME diabetic macular edema; dbh dot blot hemorrhages; CWS cotton wool spot; POAG primary open angle glaucoma; C/D cup-to-disc ratio; HVF humphrey visual field; GVF goldmann visual field; OCT optical coherence tomography; IOP intraocular pressure; BRVO Branch retinal vein occlusion; CRVO central retinal vein occlusion; CRAO central retinal artery occlusion; BRAO branch retinal artery occlusion; RT retinal tear; SB scleral buckle; PPV pars plana vitrectomy; VH Vitreous hemorrhage; PRP panretinal laser photocoagulation; IVK intravitreal kenalog; VMT vitreomacular traction; MH Macular hole;  NVD neovascularization of the disc; NVE neovascularization elsewhere; AREDS age related eye disease study; ARMD age related macular degeneration; POAG primary open angle glaucoma; EBMD epithelial/anterior basement membrane dystrophy; ACIOL anterior chamber intraocular lens; IOL intraocular lens; PCIOL posterior chamber intraocular lens; Phaco/IOL phacoemulsification with intraocular lens placement; PRK photorefractive keratectomy; LASIK laser assisted in situ keratomileusis; HTN hypertension; DM diabetes mellitus; COPD chronic obstructive pulmonary disease

## 2023-03-22 ENCOUNTER — Ambulatory Visit: Payer: Medicare Other | Admitting: Psychology

## 2023-03-22 DIAGNOSIS — F4323 Adjustment disorder with mixed anxiety and depressed mood: Secondary | ICD-10-CM | POA: Diagnosis not present

## 2023-03-22 NOTE — Progress Notes (Signed)
Date: 03/22/2023  Diagnosis Adj. Disorder with anxiety and depression Symptoms unspecified Medication Status compliance  Safety none  If Suicidal or Homicidal State Action Taken: unspecified  Current Risk: low Medications unspecified Objectives unspecified Client Response full compliance  Service Location Location, [address] [place], [place] [address]                                                                                                                                                                                                                                                                       Service Code cpt [phone]  Normalize/Reframe  Facilitate problem solving  Validate/empathize  Distress tolerance skill  Emotion regulation skills  Self care activities  Self-monitoring  Mindfulness training  Session Notes:  Dx: Adjustment Disorder with Anxiety and Depression  Meds: [place] for asthma, zolair (injection), blood pressure Meds. No psychotropic.  Goals: Patient is looking to reduce symptoms of anxiety and depression. Needs help adjusting to retirement. Motivation and energy is very low. Individual therapy to define stategies to reduce symptoms. Goal date is 12-23. Revised goal date is 12-25  Patient agrees to a video Public affairs consultant) session and is aware of the platform limitations. She is at home and I am in my home office.  Elaine Luna: She says that she is feeling better, but says that she is struggling with her memory. This concerns her that she may be losing some cognitive abilities. She has been walking  with a friend every day. 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 This is  meaningful for her and is a good distraction. We talked about the fact that she misses her younger son Elaine Luna who died at 64 more than she misses older son Elaine Luna. It has to do with a very different relationship she had with Elaine Luna.          Garrel Ridgel, PhD 1:10p-2:00p 50 minutes

## 2023-03-30 ENCOUNTER — Ambulatory Visit (INDEPENDENT_AMBULATORY_CARE_PROVIDER_SITE_OTHER): Payer: Medicare Other | Admitting: Ophthalmology

## 2023-03-30 ENCOUNTER — Encounter (INDEPENDENT_AMBULATORY_CARE_PROVIDER_SITE_OTHER): Payer: Self-pay | Admitting: Ophthalmology

## 2023-03-30 DIAGNOSIS — I10 Essential (primary) hypertension: Secondary | ICD-10-CM | POA: Diagnosis not present

## 2023-03-30 DIAGNOSIS — H35372 Puckering of macula, left eye: Secondary | ICD-10-CM | POA: Diagnosis not present

## 2023-03-30 DIAGNOSIS — Z961 Presence of intraocular lens: Secondary | ICD-10-CM | POA: Diagnosis not present

## 2023-03-30 DIAGNOSIS — H35033 Hypertensive retinopathy, bilateral: Secondary | ICD-10-CM

## 2023-04-05 ENCOUNTER — Ambulatory Visit: Payer: Medicare Other | Admitting: Psychology

## 2023-04-05 DIAGNOSIS — F4323 Adjustment disorder with mixed anxiety and depressed mood: Secondary | ICD-10-CM

## 2023-04-05 NOTE — Progress Notes (Signed)
 Date: 04/05/2023  Diagnosis Adj. Disorder with anxiety and depression Symptoms unspecified Medication Status compliance  Safety none  If Suicidal or Homicidal State Action Taken: unspecified  Current Risk: low Medications unspecified Objectives unspecified Client Response full compliance  Service Location Location, [address] [place], [place] [address]                                                                                                                                                                                                                                                                       Service Code cpt [phone]  Normalize/Reframe  Facilitate problem solving  Validate/empathize  Distress tolerance skill  Emotion regulation skills  Self care activities  Self-monitoring  Mindfulness training  Session Notes:  Dx: Adjustment Disorder with Anxiety and Depression  Meds: [place]el[place]kast for asthma, zolair (injection), blood pressure Meds. No psychotropic.  Goals: Patient is looking to reduce symptoms of anxiety and depression. Needs help adjusting to retirement. Motivation and energy is very low. Individual therapy to define stategies to reduce symptoms. Goal date is 12-23. Revised goal date is 12-25  Patient agrees to a video Public affairs consultant) session and is aware of the platform limitations. She is at home and I am in my home office.  Livian: She says that she is sometimes feeling that she is "waiting to die, but she is  fearful of dying. Is especially fearful of dying alone. We talked about the plan of moving in with her daughter and son in law. She says that will  help her anxiety. She knows she will be welcomed by them. She is fearful of talking to her local son about her plans, thinking he would get mad at her. She thinks her son will encourage her to consider [facility]. We talked about scripting a talk with her son. We will review together and address her concerns. She will work on a draft and we will review next session.             Garrel Ridgel, PhD 1:10p-2:00p 50 minutes

## 2023-04-19 ENCOUNTER — Ambulatory Visit (INDEPENDENT_AMBULATORY_CARE_PROVIDER_SITE_OTHER): Payer: Medicare Other | Admitting: Psychology

## 2023-04-19 DIAGNOSIS — F4323 Adjustment disorder with mixed anxiety and depressed mood: Secondary | ICD-10-CM

## 2023-04-19 NOTE — Progress Notes (Signed)
 Date: 04/19/2023  Diagnosis Adj. Disorder with anxiety and depression Symptoms unspecified Medication Status compliance  Safety none  If Suicidal or Homicidal State Action Taken: unspecified  Current Risk: low Medications unspecified Objectives unspecified Client Response full compliance  Service Location Location, [address] [place], [place] [address]                                                                                                                                                                                                                                                                       Service Code cpt [phone]  Normalize/Reframe  Facilitate problem solving  Validate/empathize  Distress tolerance skill  Emotion regulation skills  Self care activities  Self-monitoring  Mindfulness training  Session Notes:  Dx: Adjustment Disorder with Anxiety and Depression  Meds: [place]el[place]kast for asthma, zolair (injection), blood pressure Meds. No psychotropic.  Goals: Patient is looking to reduce symptoms of anxiety and depression. Needs help adjusting to retirement. Motivation and energy is very low. Individual therapy to define stategies to reduce symptoms. Goal date is 12-23. Revised goal date is 12-25  Patient agrees to a video Public affairs consultant) session and is aware of the platform limitations. She is at home and I am in my home office.  Riven: She  states that she met with a person to help her purge and move. Scheduled a time for her to come help in April. Micheline says she isn't sure if she should move right in with daughter or get her  own place. She is thinking about making a move to daughter's home "within the year". Discussed she may be ready before that time.               Garrel Ridgel, PhD 1:10p-2:00p 50 minutes

## 2023-05-03 ENCOUNTER — Ambulatory Visit (INDEPENDENT_AMBULATORY_CARE_PROVIDER_SITE_OTHER): Payer: Medicare Other | Admitting: Psychology

## 2023-05-03 DIAGNOSIS — F4323 Adjustment disorder with mixed anxiety and depressed mood: Secondary | ICD-10-CM | POA: Diagnosis not present

## 2023-05-03 NOTE — Progress Notes (Signed)
 Date: 05/03/2023  Diagnosis Adj. Disorder with anxiety and depression Symptoms unspecified Medication Status compliance  Safety none  If Suicidal or Homicidal State Action Taken: unspecified  Current Risk: low Medications unspecified Objectives unspecified Client Response full compliance  Service Location Location, [address] [place], [place] [address]                                                                                                                                                                                                                                                                       Service Code cpt [phone]  Normalize/Reframe  Facilitate problem solving  Validate/empathize  Distress tolerance skill  Emotion regulation skills  Self care activities  Self-monitoring  Mindfulness training  Session Notes:  Dx: Adjustment Disorder with Anxiety and Depression  Meds: [place]el[place]kast for asthma, zolair (injection), blood pressure Meds. No psychotropic.  Goals: Patient is looking to reduce symptoms of anxiety and depression. Needs help adjusting to retirement. Motivation and energy is very low. Individual therapy to define stategies to reduce symptoms. Goal date is 12-23. Revised goal date is 12-25  Patient agrees to a video Public affairs consultant) session and is aware of the platform  limitations. She is at home and I am in my home office.  Charlayne: She states she is no longer inhibited to tell son here in town that she wants to move in with her daughter. She isn't as close to him as she is to her other children.  Says he is "more critical" of her than the others. She says she is still concerned she is deteriorating quickly due to her difficulty making decisions. There is no basis for this, but it is how she feels. She is turning 78 next week, but still celebrate when she goes to visit daughter in April in [place]. Discussed how she can best make decisions without fears of making mistakes.                   Garrel Ridgel, PhD 1:10p-2:00p 50 minutes

## 2023-05-17 ENCOUNTER — Ambulatory Visit (INDEPENDENT_AMBULATORY_CARE_PROVIDER_SITE_OTHER): Payer: Medicare Other | Admitting: Psychology

## 2023-05-17 DIAGNOSIS — F4323 Adjustment disorder with mixed anxiety and depressed mood: Secondary | ICD-10-CM

## 2023-05-17 NOTE — Progress Notes (Signed)
 Date: 05/17/2023  Diagnosis Adj. Disorder with anxiety and depression Symptoms unspecified Medication Status compliance  Safety none  If Suicidal or Homicidal State Action Taken: unspecified  Current Risk: low Medications unspecified Objectives unspecified Client Response full compliance  Service Location Location, [address] [place], [place] [address]                                                                                                                                                                                                                                                                       Service Code cpt [phone]  Normalize/Reframe  Facilitate problem solving  Validate/empathize  Distress tolerance skill  Emotion regulation skills  Self care activities  Self-monitoring  Mindfulness training  Session Notes:  Dx: Adjustment Disorder with Anxiety and Depression  Meds: [place]el[place]kast for asthma, zolair (injection), blood pressure Meds. No psychotropic.  Goals: Patient is looking to reduce symptoms of anxiety and depression. Needs help adjusting to retirement. Motivation and energy is very low. Individual therapy to define stategies to reduce symptoms. Goal date is 12-23. Revised goal date is  12-25  Patient agrees to a video Public affairs consultant) session and is aware of the platform limitations. She is at home and I am in my home office.  Tyreisha: She says that she is worrying about things, with no reason, and doesn't act on the distress. Struggles with initiating action. Does say  that she is cleaning out her house and is doing a good job of keeping up with it. She is scheduled to go on a cruise to [place] and [place] with close friend. She says she is nervous about political unrest about the [place]  and it will somehow have a negative impact on her safety. She has donated 6 bags of clothes to a shelter. Planning to go visit daughter and son in law to see if she can live with them long term.                         Jola Nash, PhD 1:10p-2:00p 50 minutes

## 2023-05-24 ENCOUNTER — Encounter (HOSPITAL_BASED_OUTPATIENT_CLINIC_OR_DEPARTMENT_OTHER): Payer: Self-pay | Admitting: Pulmonary Disease

## 2023-05-24 ENCOUNTER — Ambulatory Visit (HOSPITAL_BASED_OUTPATIENT_CLINIC_OR_DEPARTMENT_OTHER): Payer: Medicare Other | Admitting: Pulmonary Disease

## 2023-05-24 ENCOUNTER — Other Ambulatory Visit (HOSPITAL_BASED_OUTPATIENT_CLINIC_OR_DEPARTMENT_OTHER): Payer: Self-pay

## 2023-05-24 VITALS — BP 136/80 | HR 54 | Ht 64.0 in | Wt 129.6 lb

## 2023-05-24 DIAGNOSIS — J4550 Severe persistent asthma, uncomplicated: Secondary | ICD-10-CM | POA: Diagnosis not present

## 2023-05-24 MED ORDER — ALBUTEROL SULFATE HFA 108 (90 BASE) MCG/ACT IN AERS
2.0000 | INHALATION_SPRAY | Freq: Four times a day (QID) | RESPIRATORY_TRACT | 3 refills | Status: AC | PRN
  Filled 2023-05-24: qty 6.7, 25d supply, fill #0
  Filled 2023-07-11: qty 6.7, 25d supply, fill #1
  Filled 2023-09-15: qty 6.7, 25d supply, fill #2
  Filled 2023-10-07 (×2): qty 6.7, 25d supply, fill #3

## 2023-05-24 NOTE — Progress Notes (Signed)
 Subjective:   PATIENT ID: Elaine Luna GENDER: female DOB: 11-16-1945, MRN: [id number]   HPI  Chief Complaint  Patient presents with   Follow-up    asthma   Reason for Visit: Follow-up   [name] is a 78 year old female with severe persistent asthma, hx of COVID-19 10/2018, hx bilateral segmental pulmonary emboli s/p anticoagulation x 3 months who presents for follow-up.  11/25/20 She reports her asthma is well-controlled on Xolair  and Qvair 1-2 times a day. Not on Dulera. Has not had an asthma exacerbation in 2 years. Denies cough, wheezing or shortness of breath. She is enrolled in [employer] and does two zumba classes a day five days a week.   02/07/21 Since discontinuing Xolair  she reports her symptoms are well controlled. Qvar  one puff twice day. Not needing albuterol  inhaler. She is active with [employer] and going to zumba daily. Over the holiday she had a flu like illness with sore throat but no respiratory symptoms.  03/11/22 Since our last visit she denies shortness of breath, cough or wheezing. Rarely uses her albuterol  inhaler. Has been trying to apply for volunteering but not accepted. Wanting to be more involved in the community.   05/29/22 Since our last visit she went to a river cruise and returned on 05/23/22. But had developed productive cough chest tightness and wheezing. She had presented to ED and treated with albuterol . No prednisone  was given. She is improving but still persistent symptoms.  05/24/23 Since our last visit she reports her breathing is overall fine. Not currently on Symbicort  and on rescue inhaler 2-3x a week proactively for walking. Denies shortness of breath, wheezing or cough. She has had some dizziness but has not been drinking water lately. Reports unchanged heart rate.  Social Hx Her first son passed from suicide. Her second son recently passed away from COVID in 16-Jun-2020.  Past Medical History:  Diagnosis Date    Asthma    Colon polyps    DM type 2 (diabetes mellitus, type 2) (HCC) 06/16/2013    Hyperlipidemia    Hypertension    Obesity    Pulmonary embolism associated with COVID-19 (HCC) 12/25/2018   Tubular adenoma     Outpatient Medications Prior to Visit  Medication Sig Dispense Refill   ALPRAZolam  (XANAX ) 0.25 MG tablet Take 1 tablet (0.25 mg total) by mouth 2 (two) times daily as needed for anxiety. 60 tablet 2   amLODipine  (NORVASC ) 5 MG tablet Take 0.5 tablets (2.5 mg total) by mouth daily. 90 tablet 3   chlorthalidone  (HYGROTON ) 25 MG tablet Take 1 tablet (25 mg total) by mouth daily. 45 tablet 1   Denosumab  (PROLIA  Adair) Inject into the skin every 6 (six) months.     irbesartan  (AVAPRO ) 150 MG tablet Take 1 tablet (150 mg total) by mouth daily. 90 tablet 3   Vitamin D , Ergocalciferol , (DRISDOL ) 1.25 MG (50000 UNIT) CAPS capsule Take 1 capsule (50,000 Units total) by mouth 2 (two) times a week. 24 capsule 3   albuterol  (VENTOLIN  HFA) 108 (90 Base) MCG/ACT inhaler Inhale 2 puffs into the lungs every 6 (six) hours as needed. 6.7 g 3   budesonide -formoterol  (SYMBICORT ) 80-4.5 MCG/ACT inhaler Inhale 2 puffs into the lungs 2 (two) times daily. 10.2 g 1   atorvastatin  (LIPITOR) 20 MG tablet Take 1 tablet (20 mg total) by mouth daily. 90 tablet 3   amLODipine  (NORVASC ) 5 MG tablet Take 1 tablet (5 mg total) by mouth daily. (Patient  not taking: Reported on 05/24/2023) 90 tablet 3   amLODipine  (NORVASC ) 5 MG tablet Take 1 tablet (5 mg total) by mouth daily as directed. (Patient not taking: Reported on 05/24/2023) 90 tablet 3   atorvastatin  (LIPITOR) 20 MG tablet Take 1 tablet (20 mg total) by mouth daily. (Patient not taking: Reported on 05/24/2023) 90 tablet 3   atorvastatin  (LIPITOR) 20 MG tablet Take 1 tablet (20 mg total) by mouth daily. (Patient not taking: Reported on 05/24/2023) 90 tablet 3   irbesartan  (AVAPRO ) 300 MG tablet TAKE 1/2 TABLET BY MOUTH ONCE DAILY (Patient not taking: Reported on 05/24/2023)  45 tablet 3   irbesartan  (AVAPRO ) 300 MG tablet Take 0.5 tablets (150 mg total) by mouth daily. (Patient not taking: Reported on 05/24/2023) 45 tablet 3   pantoprazole  (PROTONIX ) 20 MG tablet Take 1 tablet (20 mg total) by mouth daily. (Patient not taking: Reported on 05/24/2023) 90 tablet 3   pantoprazole  (PROTONIX ) 20 MG tablet Take 1 tablet (20 mg total) by mouth daily. (Patient not taking: Reported on 05/24/2023) 90 tablet 3   pantoprazole  (PROTONIX ) 20 MG tablet Take 1 tablet (20 mg total) by mouth daily. (Patient not taking: Reported on 05/24/2023) 90 tablet 0   Vitamin D , Ergocalciferol , (DRISDOL ) 1.25 MG (50000 UNIT) CAPS capsule Take 1 capsule (50,000 Units total) by mouth 2 (two) times a week. (Patient not taking: Reported on 05/24/2023) 24 capsule 3   No facility-administered medications prior to visit.    Review of Systems  Constitutional:  Negative for chills, diaphoresis, fever, malaise/fatigue and weight loss.  HENT:  Negative for congestion.   Respiratory:  Negative for cough, hemoptysis, sputum production, shortness of breath and wheezing.   Cardiovascular:  Negative for chest pain, palpitations and leg swelling.     Objective:   Vitals:   05/24/23 1304  BP: 136/80  Pulse: (!) 54  SpO2: 98%  Weight: 129 lb 9.6 oz (58.8 kg)  Height: 5\' 4"  (1.626 m)   SpO2: 98 %  Physical Exam: General: Well-appearing, no acute distress HENT: [place], AT Eyes: EOMI, no scleral icterus Respiratory: Clear to auscultation bilaterally.  No crackles, wheezing or rales Cardiovascular: RRR, -M/R/G, no JVD Extremities:-Edema,-tenderness Neuro: AAO x4, CNII-XII grossly intact Psych: Normal mood, normal affect  Data Reviewed:  Imaging: HRCT 04/02/16 - air trapping present on expiration, mild cylindrical bronchiectasis and scattered sub-centimeter pulmonary nodules < 4 mm that are unchanged  CTA 11/14/18 - several bilateral tiny pulmonary emboli in segmental branches of RUL, RLL, lingula and LLL.  Stable bilateral pulmonary nodules  CXR 05/18/20 - hyperinflated  CTA 07/01/22 - No PE. Normal lung parenchyma  PFT: None on file    Assessment & Plan:   Discussion: 78 year old female with severe persistent asthma, hx aspergillus IgE antibody in 2017, hx PE in setting of COVID in 2019 and hx of benign pulmonary nodules who presents for follow-up. Off Xolair  since 11/2020 and started on bronchodilators in 05/2022 briefly but not on anything currently. Overall doing well.  Severe Persistent Asthma  No exacerbations in >2 years until 05/2022 Xolair  discontinued 11/2020 with no recurrent symptoms --REFILL Albuterol  --DISCONTINUED Singulair  due to concerns for anxiety/depression  Benign pulmonary nodules Stable since 2018. No further imaging warranted  Bradycardia, palpitations - no active symptoms or concerns Dizziness Encouraged PO hydration. If persistent advised to discuss with Cardiology Followed by Cardiology with Dr. Rolm Clos  Health Maintenance Immunization History  Administered Date(s) Administered   Fluad Trivalent(High Dose 65+) 11/12/2022   Influenza  Split 11/02/2012, 11/05/2014, 10/04/2015   Influenza Whole 10/13/2007, 11/03/2010, 11/06/2011   Influenza, High Dose Seasonal PF 11/02/2016, 12/05/2018   Influenza-Unspecified 11/02/2013, 11/21/2016   PFIZER Comirnaty Martina Sledge Top)Covid-19 Tri-Sucrose Vaccine 05/30/2020   PFIZER(Purple Top)SARS-COV-2 Vaccination 01/20/2019, 02/10/2019   Pfizer Covid-19 Vaccine Bivalent Booster 75yrs & up 11/21/2020, 06/09/2021   Pfizer(Comirnaty )Fall Seasonal Vaccine 12 years and older 11/17/2021, 10/27/2022   Pneumococcal Conjugate-13 11/02/2012   Pneumococcal Polysaccharide-23 02/03/2008, 12/05/2018   Respiratory Syncytial Virus Vaccine ,Recomb Aduvanted(Arexvy ) 11/24/2021   Zoster Recombinant(Shingrix) 01/05/2019, 03/16/2019   No orders of the defined types were placed in this encounter.  Meds ordered this encounter  Medications    albuterol  (VENTOLIN  HFA) 108 (90 Base) MCG/ACT inhaler    Sig: Inhale 2 puffs into the lungs every 6 (six) hours as needed.    Dispense:  6.7 g    Refill:  3    Return in about 1 year (around 05/23/2024).  I have spent a total time of 30-minutes on the day of the appointment including chart review, data review, collecting history, coordinating care and discussing medical diagnosis and plan with the patient/family. Past medical history, allergies, medications were reviewed. Pertinent imaging, labs and tests included in this note have been reviewed and interpreted independently by me.  Shoshannah Faubert Genetta Kenning, MD [facility] 05/24/2023 1:17 PM

## 2023-05-24 NOTE — Patient Instructions (Signed)
  Severe Persistent Asthma  No exacerbations in >2 years until 05/2022 Xolair  discontinued 11/2020 with no recurrent symptoms --REFILL Albuterol  --DISCONTINUED Singulair  due to concerns for anxiety/depression  Benign pulmonary nodules Stable since 2018. No further imaging warranted  Bradycardia, palpitations - no active symptoms or concerns Dizziness Encouraged PO hydration. If persistent advised to discuss with Cardiology Followed by Cardiology with Dr. Rolm Clos

## 2023-05-31 ENCOUNTER — Ambulatory Visit (INDEPENDENT_AMBULATORY_CARE_PROVIDER_SITE_OTHER): Payer: Medicare Other | Admitting: Psychology

## 2023-05-31 DIAGNOSIS — F4323 Adjustment disorder with mixed anxiety and depressed mood: Secondary | ICD-10-CM

## 2023-05-31 NOTE — Progress Notes (Signed)
 Date: 05/31/2023  Diagnosis Adj. Disorder with anxiety and depression Symptoms unspecified Medication Status compliance  Safety none  If Suicidal or Homicidal State Action Taken: unspecified  Current Risk: low Medications unspecified Objectives unspecified Client Response full compliance  Service Location Location, [address] [place], [place] [address]                                                                                                                                                                                                                                                                       Service Code cpt [phone]  Normalize/Reframe  Facilitate problem solving  Validate/empathize  Distress tolerance skill  Emotion regulation skills  Self care activities  Self-monitoring  Mindfulness training  Session Notes:  Dx: Adjustment Disorder with Anxiety and Depression  Meds: [place]ast for asthma, zolair (injection), blood pressure Meds. No psychotropic.  Goals: Patient is looking to reduce symptoms of anxiety and depression. Needs help adjusting to retirement. Motivation and energy is very low. Individual  therapy to define stategies to reduce symptoms. Goal date is 12-23. Revised goal date is 12-25  Patient agrees to a video Public affairs consultant) session and is aware of the platform limitations. She is at home and I am in my home office.  Elaine Luna: She has visited daughter and son I law and feels it has been positive. Will return later in summer for a  longer period of time (all of June and July). Said that she is less lonely in [place]  and therefore much less anxious. She says "my fears are gone". She says she has continued to clean out her home in anticipation of moving. We discussed that she is developing a plan to move and feels positive about it.                           Elaine Nash, PhD 1:10p-2:00p 50 minutes

## 2023-06-14 ENCOUNTER — Ambulatory Visit (INDEPENDENT_AMBULATORY_CARE_PROVIDER_SITE_OTHER): Payer: Medicare Other | Admitting: Psychology

## 2023-06-14 DIAGNOSIS — F4323 Adjustment disorder with mixed anxiety and depressed mood: Secondary | ICD-10-CM

## 2023-06-14 NOTE — Progress Notes (Signed)
 Date: 06/14/2023  Diagnosis Adj. Disorder with anxiety and depression Symptoms unspecified Medication Status compliance  Safety none  If Suicidal or Homicidal State Action Taken: unspecified  Current Risk: low Medications unspecified Objectives unspecified Client Response full compliance  Service Location Location, [address] [place], [place] [address]                                                                                                                                                                                                                                                                       Service Code cpt [id number]  Normalize/Reframe  Facilitate problem solving  Validate/empathize  Distress tolerance skill  Emotion regulation skills  Self care activities  Self-monitoring  Mindfulness training  Session Notes:  Dx: Adjustment Disorder with Anxiety and Depression  Meds: [place]el[place]kast for asthma, zolair (injection), blood pressure Meds. No psychotropic.  Goals: Patient is looking to reduce symptoms of  anxiety and depression. Needs help adjusting to retirement. Motivation and energy is very low. Individual therapy to define stategies to reduce symptoms. Goal date is 12-23. Revised goal date is 12-25  Patient agrees to a video Public affairs consultant) session and is aware of the platform limitations. She is at home and I am in my home office.  Shay: She has visited daughter again and says that she has enjoyed being with  family. Plans to spend June and July in [place] , to help her with the final decision to move. She anticipates her local son will say something to her about a potential to move to [place]. She isn't really sure if he will support or reject the idea of her moving. Only reservation is the size of her room. Has to decide if she can tolerate over the next year.                             Jola Nash, PhD 1:10p-2:00p 50 minutes

## 2023-07-12 ENCOUNTER — Other Ambulatory Visit (HOSPITAL_BASED_OUTPATIENT_CLINIC_OR_DEPARTMENT_OTHER): Payer: Self-pay

## 2023-07-12 ENCOUNTER — Ambulatory Visit: Admitting: Psychology

## 2023-07-26 ENCOUNTER — Ambulatory Visit: Admitting: Psychology

## 2023-08-09 ENCOUNTER — Ambulatory Visit (INDEPENDENT_AMBULATORY_CARE_PROVIDER_SITE_OTHER): Admitting: Psychology

## 2023-08-09 DIAGNOSIS — F4323 Adjustment disorder with mixed anxiety and depressed mood: Secondary | ICD-10-CM | POA: Diagnosis not present

## 2023-08-09 NOTE — Progress Notes (Signed)
 Date: 08/09/2023  Diagnosis Adj. Disorder with anxiety and depression Symptoms unspecified Medication Status compliance  Safety none  If Suicidal or Homicidal State Action Taken: unspecified  Current Risk: low Medications unspecified Objectives unspecified Client Response full compliance  Service Location Location, [address] [place], [place] [address]                                                                                                                                                                                                                                                                       Service Code cpt [phone]  Normalize/Reframe  Facilitate problem solving  Validate/empathize  Distress tolerance skill  Emotion regulation skills  Self care activities  Self-monitoring  Mindfulness training  Session Notes:  Dx: Adjustment Disorder with Anxiety and Depression  Meds: [place]el[place]ast for asthma, zolair (injection), blood pressure Meds. No psychotropic.  Goals: Patient is looking to reduce symptoms of anxiety and depression. Needs help adjusting to retirement. Motivation and energy is very low. Individual therapy to define stategies to reduce symptoms. Goal date is 12-23. Revised goal date is 12-25  Patient agrees to a video Public affairs consultant) session and is aware of the platform limitations. She is at home and I am in my home office.  Rewa: She states that it has been great to be with her family in [place] , but the environment is not ideal. They are living in someone else's farm house. They have nine dogs and need to be in a large place. She will delay moving until they have their own house. Says that her biggest worry now is that she will die and lose all of her relationships. She wants to be able to be around for the life cycle events of her family. We discussed being present and not focused on future uncertainty. She will employ strategies to be in  the moment with family and friends.                                CONI ALM KERNS, PhD 1:15p-2:00p 45 minutes

## 2023-08-23 ENCOUNTER — Ambulatory Visit (INDEPENDENT_AMBULATORY_CARE_PROVIDER_SITE_OTHER): Admitting: Psychology

## 2023-08-23 DIAGNOSIS — F4323 Adjustment disorder with mixed anxiety and depressed mood: Secondary | ICD-10-CM

## 2023-08-23 NOTE — Progress Notes (Signed)
 Date: 08/23/2023  Diagnosis Adj. Disorder with anxiety and depression Symptoms unspecified Medication Status compliance  Safety none  If Suicidal or Homicidal State Action Taken: unspecified  Current Risk: low Medications unspecified Objectives unspecified Client Response full compliance  Service Location Location, [address] [place], [place] [address]                                                                                                                                                                                                                                                                       Service Code cpt [phone]  Normalize/Reframe  Facilitate problem solving  Validate/empathize  Distress tolerance skill  Emotion regulation skills  Self care activities  Self-monitoring  Mindfulness training  Session Notes:  Dx: Adjustment Disorder with Anxiety and Depression  Meds: [place]ast for asthma, zolair (injection), blood pressure Meds. No psychotropic.  Goals: Patient is looking to reduce symptoms of anxiety and depression. Needs help adjusting to retirement. Motivation and energy is very low. Individual therapy to define stategies to reduce symptoms. Goal date is 12-23. Revised goal date is 12-25  Patient agrees to a video Public affairs consultant) session and is aware of the platform limitations. She is at home and I am in my home office.  Sibbie: She is at her daughter's house and still feels she is in someone else's house. She says she has learned when to keep her mouth shut. Yet, she still feels terrified of being alone and is not sure why. In regard to her decision to move, she would only move in with them if they lived in their own house. Discussed how best to address her anxiety and recognition that she han handle whatever circumstance she faces. Will also address more specifics with daughter about what is needed to get into  their own house and at what level Jaysie will need to provide support.                                   CONI ALM KERNS, PhD 1:15p-2:00p 45 minutes

## 2023-09-06 ENCOUNTER — Ambulatory Visit (INDEPENDENT_AMBULATORY_CARE_PROVIDER_SITE_OTHER): Admitting: Psychology

## 2023-09-06 DIAGNOSIS — F4323 Adjustment disorder with mixed anxiety and depressed mood: Secondary | ICD-10-CM | POA: Diagnosis not present

## 2023-09-06 NOTE — Progress Notes (Signed)
 Date: 09/06/2023  Diagnosis Adj. Disorder with anxiety and depression Symptoms unspecified Medication Status compliance  Safety none  If Suicidal or Homicidal State Action Taken: unspecified  Current Risk: low Medications unspecified Objectives unspecified Client Response full compliance  Service Location Location, [address] [place], [place] [address]                                                                                                                                                                                                                                                                       Service Code cpt [phone]  Normalize/Reframe  Facilitate problem solving  Validate/empathize  Distress tolerance skill  Emotion regulation skills  Self care activities  Self-monitoring  Mindfulness training  Session Notes:  Dx: Adjustment Disorder with Anxiety and Depression  Meds: [place]ast for asthma, zolair (injection), blood pressure Meds. No psychotropic.  Goals: Patient is looking to reduce symptoms of anxiety and depression. Needs help adjusting to retirement. Motivation and energy is very low. Individual therapy to define stategies to reduce symptoms. Goal date is 12-23. Revised goal date is 12-25  Patient agrees to a video Public affairs consultant) session and is aware of the platform limitations. She is at home and I am in my home office.  Trisa: She says she is glad to be home, but is feeling lonely. Leaves August 18 for a 15 night cruise. Will go with her close friend (son's mother in law). Since home, she has been focusing on home repairs. She talked about potential move to live with daughter and her family. Only problem she foresees is that their 80 year old is not well behaved or respectful. Talked about remaining positive and optimistic about upcoming trip. Best was to minimize anxiety and depressive  feelings.                                       CONI ALM KERNS, PhD 1:15p-2:00p 45 minutes

## 2023-09-10 ENCOUNTER — Other Ambulatory Visit (HOSPITAL_BASED_OUTPATIENT_CLINIC_OR_DEPARTMENT_OTHER): Payer: Self-pay

## 2023-09-10 MED ORDER — IRBESARTAN 75 MG PO TABS
75.0000 mg | ORAL_TABLET | Freq: Every day | ORAL | 3 refills | Status: AC
  Filled 2023-09-10: qty 90, 90d supply, fill #0

## 2023-09-10 MED ORDER — ATORVASTATIN CALCIUM 20 MG PO TABS
20.0000 mg | ORAL_TABLET | Freq: Every day | ORAL | 3 refills | Status: AC
  Filled 2023-09-10: qty 90, 90d supply, fill #0

## 2023-09-15 ENCOUNTER — Other Ambulatory Visit (HOSPITAL_BASED_OUTPATIENT_CLINIC_OR_DEPARTMENT_OTHER): Payer: Self-pay

## 2023-09-16 ENCOUNTER — Other Ambulatory Visit (HOSPITAL_BASED_OUTPATIENT_CLINIC_OR_DEPARTMENT_OTHER): Payer: Self-pay

## 2023-09-20 ENCOUNTER — Ambulatory Visit: Admitting: Psychology

## 2023-10-06 ENCOUNTER — Emergency Department (HOSPITAL_BASED_OUTPATIENT_CLINIC_OR_DEPARTMENT_OTHER)
Admission: EM | Admit: 2023-10-06 | Discharge: 2023-10-06 | Disposition: A | Discharge status: Home / Self Care | Attending: Emergency Medicine | Admitting: Emergency Medicine

## 2023-10-06 ENCOUNTER — Emergency Department (HOSPITAL_BASED_OUTPATIENT_CLINIC_OR_DEPARTMENT_OTHER)

## 2023-10-06 ENCOUNTER — Encounter (HOSPITAL_BASED_OUTPATIENT_CLINIC_OR_DEPARTMENT_OTHER): Payer: Self-pay | Admitting: Emergency Medicine

## 2023-10-06 ENCOUNTER — Other Ambulatory Visit: Payer: Self-pay

## 2023-10-06 DIAGNOSIS — J4541 Moderate persistent asthma with (acute) exacerbation: Secondary | ICD-10-CM | POA: Insufficient documentation

## 2023-10-06 DIAGNOSIS — R0602 Shortness of breath: Secondary | ICD-10-CM | POA: Diagnosis present

## 2023-10-06 LAB — COMPREHENSIVE METABOLIC PANEL WITH GFR
ALT: 16 U/L (ref 0–44)
AST: 22 U/L (ref 15–41)
Albumin: 4.4 g/dL (ref 3.5–5.0)
Alkaline Phosphatase: 67 U/L (ref 38–126)
Anion gap: 14 (ref 5–15)
BUN: 27 mg/dL — ABNORMAL HIGH (ref 8–23)
CO2: 24 mmol/L (ref 22–32)
Calcium: 10 mg/dL (ref 8.9–10.3)
Chloride: 101 mmol/L (ref 98–111)
Creatinine, Ser: 1.31 mg/dL — ABNORMAL HIGH (ref 0.44–1.00)
GFR, Estimated: 41 mL/min — ABNORMAL LOW (ref >60)
Glucose, Bld: 150 mg/dL — ABNORMAL HIGH (ref 70–99)
Potassium: 3.9 mmol/L (ref 3.5–5.1)
Sodium: 139 mmol/L (ref 135–145)
Total Bilirubin: 0.7 mg/dL (ref 0.0–1.2)
Total Protein: 7.2 g/dL (ref 6.5–8.1)

## 2023-10-06 LAB — CBC WITH DIFFERENTIAL/PLATELET
Abs Immature Granulocytes: 0 K/uL (ref 0.00–0.07)
Basophils Absolute: 0 K/uL (ref 0.0–0.1)
Basophils Relative: 0 %
Eosinophils Absolute: 0.2 K/uL (ref 0.0–0.5)
Eosinophils Relative: 4 %
HCT: 39.9 % (ref 36.0–46.0)
Hemoglobin: 13.6 g/dL (ref 12.0–15.0)
Immature Granulocytes: 0 %
Lymphocytes Relative: 30 %
Lymphs Abs: 1.4 K/uL (ref 0.7–4.0)
MCH: 31.3 pg (ref 26.0–34.0)
MCHC: 34.1 g/dL (ref 30.0–36.0)
MCV: 91.9 fL (ref 80.0–100.0)
Monocytes Absolute: 0.7 K/uL (ref 0.1–1.0)
Monocytes Relative: 15 %
Neutro Abs: 2.4 K/uL (ref 1.7–7.7)
Neutrophils Relative %: 51 %
Platelets: 259 K/uL (ref 150–400)
RBC: 4.34 MIL/uL (ref 3.87–5.11)
RDW: 13.5 % (ref 11.5–15.5)
WBC: 4.7 K/uL (ref 4.0–10.5)
nRBC: 0 % (ref 0.0–0.2)

## 2023-10-06 LAB — TROPONIN T, HIGH SENSITIVITY: Troponin T High Sensitivity: 26 ng/L — ABNORMAL HIGH (ref 0–19)

## 2023-10-06 LAB — PRO BRAIN NATRIURETIC PEPTIDE: Pro Brain Natriuretic Peptide: 1287 pg/mL — ABNORMAL HIGH (ref <300.0)

## 2023-10-06 MED ORDER — PREDNISONE 20 MG PO TABS: ORAL_TABLET | ORAL | 0 refills | Status: DC

## 2023-10-06 MED ORDER — ALBUTEROL SULFATE (2.5 MG/3ML) 0.083% IN NEBU
2.5000 mg | INHALATION_SOLUTION | Freq: Once | RESPIRATORY_TRACT | Status: AC
  Administered 2023-10-06: 2.5 mg via RESPIRATORY_TRACT
  Filled 2023-10-06: qty 3

## 2023-10-06 MED ORDER — IPRATROPIUM-ALBUTEROL 0.5-2.5 (3) MG/3ML IN SOLN
3.0000 mL | RESPIRATORY_TRACT | Status: AC
  Administered 2023-10-06 (×2): 3 mL via RESPIRATORY_TRACT
  Filled 2023-10-06: qty 6

## 2023-10-06 MED ORDER — PREDNISONE 20 MG PO TABS
40.0000 mg | ORAL_TABLET | Freq: Every day | ORAL | 0 refills | Status: AC
  Filled 2023-10-06: qty 8, 4d supply, fill #0

## 2023-10-06 MED ORDER — PREDNISONE 50 MG PO TABS
60.0000 mg | ORAL_TABLET | Freq: Once | ORAL | Status: AC
  Administered 2023-10-06: 60 mg via ORAL
  Filled 2023-10-06: qty 1

## 2023-10-06 MED ORDER — IPRATROPIUM-ALBUTEROL 0.5-2.5 (3) MG/3ML IN SOLN: 3.0000 mL | RESPIRATORY_TRACT | Status: DC

## 2023-10-06 MED ORDER — IPRATROPIUM-ALBUTEROL 0.5-2.5 (3) MG/3ML IN SOLN
3.0000 mL | Freq: Once | RESPIRATORY_TRACT | Status: AC
  Administered 2023-10-06: 3 mL via RESPIRATORY_TRACT
  Filled 2023-10-06: qty 3

## 2023-10-06 NOTE — ED Provider Notes (Signed)
  EMERGENCY DEPARTMENT AT [facility] Provider Note   CSN: [id number] Arrival date & time: 10/06/23  8091     Patient presents with: Shortness of Breath   Elaine Luna is a 78 y.o. female.   78 yo F with a chief complaint of difficulty breathing.  She tells me that she has asthma but it went away when she got COVID.  She started having difficulty breathing again yesterday.  Cough no fever.  No known sick contacts.  She was just on a cruise to [place] in [place].  She denies chest pain or pressure denies abdominal pain.   Shortness of Breath      Prior to Admission medications   Medication Sig Start Date End Date Taking? Authorizing Provider  predniSONE  (DELTASONE ) 20 MG tablet 2 tabs po daily x 4 days 10/06/23  Yes Emil Share, DO  albuterol  (VENTOLIN  HFA) 108 (90 Base) MCG/ACT inhaler Inhale 2 puffs into the lungs every 6 (six) hours as needed. 05/24/23   Kassie Acquanetta Bradley, MD  ALPRAZolam  (XANAX ) 0.25 MG tablet Take 1 tablet (0.25 mg total) by mouth 2 (two) times daily as needed for anxiety. 06/15/22     amLODipine  (NORVASC ) 5 MG tablet Take 0.5 tablets (2.5 mg total) by mouth daily. 08/28/22   Loistine Sober, NP  atorvastatin  (LIPITOR) 20 MG tablet Take 1 tablet (20 mg total) by mouth daily. 02/25/23     atorvastatin  (LIPITOR) 20 MG tablet Take 1 tablet (20 mg total) by mouth daily. 09/10/23     chlorthalidone  (HYGROTON ) 25 MG tablet Take 1 tablet (25 mg total) by mouth daily. 08/28/22   Loistine Sober, NP  Denosumab  (PROLIA  [place]) Inject into the skin every 6 (six) months.    [provider]  irbesartan  (AVAPRO ) 150 MG tablet Take 1 tablet (150 mg total) by mouth daily. 02/25/23     irbesartan  (AVAPRO ) 75 MG tablet Take 1 tablet (75 mg total) by mouth daily. 09/10/23     Vitamin D , Ergocalciferol , (DRISDOL ) 1.25 MG (50000 UNIT) CAPS capsule Take 1 capsule (50,000 Units total) by mouth 2 (two) times a week. 02/25/23       Allergies: Ace inhibitors     Review of Systems  Respiratory:  Positive for shortness of breath.     Updated Vital Signs BP (!) 229/87 (BP Location: Left Arm)   Pulse 61   Temp 98.1 F (36.7 C) (Oral)   Resp 20   Ht 5' 4 (1.626 m)   Wt 58.5 kg   SpO2 97%   BMI 22.14 kg/m   Physical Exam Vitals and nursing note reviewed.  Constitutional:      General: She is not in acute distress.    Appearance: She is well-developed. She is not diaphoretic.  HENT:     Head: Normocephalic and atraumatic.  Eyes:     Pupils: Pupils are equal, round, and reactive to light.  Cardiovascular:     Rate and Rhythm: Normal rate and regular rhythm.     Heart sounds: No murmur heard.    No friction rub. No gallop.  Pulmonary:     Effort: Pulmonary effort is normal.     Breath sounds: Wheezing present. No rales.     Comments: Diffuse wheezes tachypnea and prolonged expiratory effort Abdominal:     General: There is no distension.     Palpations: Abdomen is soft.     Tenderness: There is no abdominal tenderness.  Musculoskeletal:  General: No tenderness.     Cervical back: Normal range of motion and neck supple.  Skin:    General: Skin is warm and dry.  Neurological:     Mental Status: She is alert and oriented to person, place, and time.  Psychiatric:        Behavior: Behavior normal.     (all labs ordered are listed, but only abnormal results are displayed) Labs Reviewed  COMPREHENSIVE METABOLIC PANEL WITH GFR - Abnormal; Notable for the following components:      Result Value   Glucose, Bld 150 (*)    BUN 27 (*)    Creatinine, Ser 1.31 (*)    GFR, Estimated 41 (*)    All other components within normal limits  PRO BRAIN NATRIURETIC PEPTIDE - Abnormal; Notable for the following components:   Pro Brain Natriuretic Peptide 1,287.0 (*)    All other components within normal limits  TROPONIN T, HIGH SENSITIVITY - Abnormal; Notable for the following components:   Troponin T High Sensitivity 26 (*)    All  other components within normal limits  CBC WITH DIFFERENTIAL/PLATELET    EKG: None  Radiology: [facility] Chest Port 1 View Result Date: 10/06/2023 CLINICAL DATA:  Cough and shortness of breath. EXAM: PORTABLE CHEST 1 VIEW COMPARISON:  06/13/2022 FINDINGS: Stable heart size and mediastinal contours. Chronic coarse lung markings. No focal airspace disease. No pulmonary edema, pleural effusion, or pneumothorax. Mild thoracic scoliosis. IMPRESSION: Chronic coarse lung markings. No acute findings. Electronically Signed   By: Andrea Gasman M.D.   On: 10/06/2023 20:40     Procedures   Medications Ordered in the ED  albuterol  (PROVENTIL ) (2.5 MG/3ML) 0.083% nebulizer solution 2.5 mg (2.5 mg Nebulization Given 10/06/23 1929)  ipratropium-albuterol  (DUONEB) 0.5-2.5 (3) MG/3ML nebulizer solution 3 mL (3 mLs Nebulization Given 10/06/23 1927)  predniSONE  (DELTASONE ) tablet 60 mg (60 mg Oral Given 10/06/23 2016)  ipratropium-albuterol  (DUONEB) 0.5-2.5 (3) MG/3ML nebulizer solution 3 mL (3 mLs Nebulization Given 10/06/23 2023)                                    Medical Decision Making Amount and/or Complexity of Data Reviewed Labs: ordered. Radiology: ordered.  Risk Prescription drug management.   78 yo F with a chief complaint of difficulty breathing.  Patient tells me she has a history of asthma and this feels the same.  Going on since yesterday.  She was given 5 mg of albuterol  and 1/2 mg of Atrovent prior to my exam.  Has significant wheezes.  Will give 2 DuoNebs back-to-back and steroids.  Chest x-ray.  Blood work.  Reassess  Patient feeling much better on repeat assessment.  No anemia, no significant electrolyte abnormalities her troponin is mildly elevated and her BNP is below the threshold.  Chest x-ray without obvious pneumonia on my independent interpretation.  I discussed results with the patient.  She is feeling much better and would like to go home.  PCP follow-up.  9:14 PM:  I have  discussed the diagnosis/risks/treatment options with the patient.  Evaluation and diagnostic testing in the emergency department does not suggest an emergent condition requiring admission or immediate intervention beyond what has been performed at this time.  They will follow up with PCP. We also discussed returning to the ED immediately if new or worsening sx occur. We discussed the sx which are most concerning (e.g., sudden worsening pain, fever, inability  to tolerate by mouth, need to use inhaler more often than every 4 hours) that necessitate immediate return. Medications administered to the patient during their visit and any new prescriptions provided to the patient are listed below.  Medications given during this visit Medications  albuterol  (PROVENTIL ) (2.5 MG/3ML) 0.083% nebulizer solution 2.5 mg (2.5 mg Nebulization Given 10/06/23 1929)  ipratropium-albuterol  (DUONEB) 0.5-2.5 (3) MG/3ML nebulizer solution 3 mL (3 mLs Nebulization Given 10/06/23 1927)  predniSONE  (DELTASONE ) tablet 60 mg (60 mg Oral Given 10/06/23 2016)  ipratropium-albuterol  (DUONEB) 0.5-2.5 (3) MG/3ML nebulizer solution 3 mL (3 mLs Nebulization Given 10/06/23 2023)     The patient appears reasonably screen and/or stabilized for discharge and I doubt any other medical condition or other [facility] requiring further screening, evaluation, or treatment in the ED at this time prior to discharge.       Final diagnoses:  Moderate persistent asthma with exacerbation    ED Discharge Orders          Ordered    predniSONE  (DELTASONE ) 20 MG tablet        10/06/23 2112               Emil Share, DO 10/06/23 2114

## 2023-10-06 NOTE — ED Triage Notes (Signed)
  Patient comes in with SOB related to asthma exacerbation.  Patient states she just got back from a cruise and started having SOB.  Patient states she has been using her albuterol  inhaler 2 puffs every 4-6 hours.  Endorses productive cough.  Denies any pain.  Hypertensive in triage 229/87.

## 2023-10-06 NOTE — ED Notes (Addendum)
 Patient presenting with asthma exacerbation. Patient has been using albuterol  inhaler (2 puff q 4-6 hrs) with minimal relief. SpO2 98%. Productive cough present. Increased WOB noted. Bilaterally expiratory wheezing present. Protocol orders placed. Patient placed in treatment room.

## 2023-10-06 NOTE — Discharge Instructions (Signed)
 Use your inhaler every 4 hours(4 puffs) while awake, return for sudden worsening shortness of breath, or if you need to use your inhaler more often.    If you do use it this often it will make you feel a bit jittery that is normal.

## 2023-10-07 ENCOUNTER — Other Ambulatory Visit (HOSPITAL_BASED_OUTPATIENT_CLINIC_OR_DEPARTMENT_OTHER): Payer: Self-pay

## 2023-10-07 ENCOUNTER — Other Ambulatory Visit: Payer: Self-pay

## 2023-10-18 ENCOUNTER — Ambulatory Visit: Admitting: Psychology

## 2023-10-18 DIAGNOSIS — F4323 Adjustment disorder with mixed anxiety and depressed mood: Secondary | ICD-10-CM | POA: Diagnosis not present

## 2023-10-18 NOTE — Progress Notes (Signed)
 Date: 10/18/2023  Diagnosis Adj. Disorder with anxiety and depression Symptoms unspecified Medication Status compliance  Safety none  If Suicidal or Homicidal State Action Taken: unspecified  Current Risk: low Medications unspecified Objectives unspecified Client Response full compliance  Service Location Location, [address] [place], [place] [address]                                                                                                                                                                                                                                                                       Service Code cpt [phone]  Normalize/Reframe  Facilitate problem solving  Validate/empathize  Distress tolerance skill  Emotion regulation skills  Self care activities  Self-monitoring  Mindfulness training  Session Notes:  Dx: Adjustment Disorder with Anxiety and Depression  Meds: [place] for asthma, zolair (injection), blood pressure Meds. No psychotropic.  Goals: Patient is looking to reduce symptoms of anxiety and depression. Needs help adjusting to retirement. Motivation and energy is very low. Individual therapy to define stategies to reduce symptoms. Goal date is 12-23. Revised goal date is 12-25  Patient agrees to a video Public affairs consultant) session and is aware of the platform limitations. She is at home and I am in my home office.  Keelin: She says she had a great time on her cruise but got sick with bronchitis when she got home. Went with a friend and she was very pleased with the ship and tours. She talked about feeling like she is dying. Admits that today is the birthday of her deceased son and it is likely a factor. In addition. Her youngest daughter called and they are having marital problems and significant financial problems. Discussed the need to not enable.                                          CONI ALM KERNS,  PhD 1:15p-2:00p 45 minutes

## 2023-11-01 ENCOUNTER — Ambulatory Visit: Admitting: Psychology

## 2023-11-15 ENCOUNTER — Ambulatory Visit (INDEPENDENT_AMBULATORY_CARE_PROVIDER_SITE_OTHER): Admitting: Psychology

## 2023-11-15 DIAGNOSIS — F4323 Adjustment disorder with mixed anxiety and depressed mood: Secondary | ICD-10-CM | POA: Diagnosis not present

## 2023-11-15 NOTE — Progress Notes (Signed)
 Date: 11/15/2023  Diagnosis Adj. Disorder with anxiety and depression Symptoms unspecified Medication Status compliance  Safety none  If Suicidal or Homicidal State Action Taken: unspecified  Current Risk: low Medications unspecified Objectives unspecified Client Response full compliance  Service Location Location, [address] [place], [place] [address]                                                                                                                                                                                                                                                                       Service Code cpt [phone]  Normalize/Reframe  Facilitate problem solving  Validate/empathize  Distress tolerance skill  Emotion regulation skills  Self care activities  Self-monitoring  Mindfulness training  Session Notes:  Dx: Adjustment Disorder with Anxiety and Depression  Meds: [place]ast for asthma, zolair (injection), blood pressure Meds. No psychotropic.  Goals: Patient is looking to reduce symptoms of anxiety and depression. Needs help adjusting to retirement. Motivation and energy is very low. Individual therapy to define stategies to reduce symptoms. Goal date is 12-23. Revised goal date is 12-25  Patient agrees to a video Public affairs consultant) session and is aware of the platform limitations. She is at home and I am in my home office.  Sheketa: She is going to [place] . Like with most things, she is anxious about the travel. She says she logically knows it will be fine, but still gets anxious. She will stay there for a number of weeks. She is not as fearful there as she is here because she doesn't feel alone. Is not as anxious about [place].  CONI ALM KERNS, PhD  1:10p-2:00p 50 minutes

## 2023-11-29 ENCOUNTER — Ambulatory Visit: Admitting: Psychology

## 2023-12-07 NOTE — Progress Notes (Shared)
 [facility] - Clinic Note  12/21/2023   CHIEF COMPLAINT Patient presents for No chief complaint on file.  HISTORY OF PRESENT ILLNESS: Elaine Luna is a 78 y.o. female who presents to the clinic today for:   Pt states her eyes are irritated in the morning when she wakes up, she is not using any drops    Referring physician: Clem Brands, PA-C  [address] [address] [place],  [place] [address]  HISTORICAL INFORMATION:  Selected notes from the MEDICAL RECORD NUMBER Referred by Clem Brands, PA-C for decreased vision OD LEE:  Ocular Hx- PMH-   CURRENT MEDICATIONS: No current outpatient medications on file. (Ophthalmic Drugs)   No current facility-administered medications for this visit. (Ophthalmic Drugs)   Current Outpatient Medications (Other)  Medication Sig   albuterol  (VENTOLIN  HFA) 108 (90 Base) MCG/ACT inhaler Inhale 2 puffs into the lungs every 6 (six) hours as needed.   ALPRAZolam  (XANAX ) 0.25 MG tablet Take 1 tablet (0.25 mg total) by mouth 2 (two) times daily as needed for anxiety.   amLODipine  (NORVASC ) 5 MG tablet Take 0.5 tablets (2.5 mg total) by mouth daily.   atorvastatin  (LIPITOR) 20 MG tablet Take 1 tablet (20 mg total) by mouth daily.   atorvastatin  (LIPITOR) 20 MG tablet Take 1 tablet (20 mg total) by mouth daily.   chlorthalidone  (HYGROTON ) 25 MG tablet Take 1 tablet (25 mg total) by mouth daily.   Denosumab  (PROLIA  [place]) Inject into the skin every 6 (six) months.   irbesartan  (AVAPRO ) 150 MG tablet Take 1 tablet (150 mg total) by mouth daily.   irbesartan  (AVAPRO ) 75 MG tablet Take 1 tablet (75 mg total) by mouth daily.   Vitamin D , Ergocalciferol , (DRISDOL ) 1.25 MG (50000 UNIT) CAPS capsule Take 1 capsule (50,000 Units total) by mouth 2 (two) times a week.   No current facility-administered medications for this visit. (Other)   REVIEW OF SYSTEMS:    ALLERGIES Allergies  Allergen Reactions    Ace Inhibitors     Other Reaction(s): Unknown   PAST MEDICAL HISTORY Past Medical History:  Diagnosis Date   Asthma    Colon polyps    DM type 2 (diabetes mellitus, type 2) (HCC) 2015    Hyperlipidemia    Hypertension    Obesity    Pulmonary embolism associated with COVID-19 (HCC) 12/25/2018   Tubular adenoma    Past Surgical History:  Procedure Laterality Date   APPENDECTOMY     CARDIAC CATHETERIZATION     CATARACT EXTRACTION, BILATERAL  2011   CHOLECYSTECTOMY     ESOPHAGOGASTRODUODENOSCOPY  2011   TONSILLECTOMY     VESICOVAGINAL FISTULA CLOSURE W/ TAH     FAMILY HISTORY Family History  Problem Relation Age of Onset   Heart disease Mother    Heart disease Father    Diabetes Brother    Heart disease Brother    Diabetes Maternal Grandmother    Diabetes Maternal Grandfather    Asthma Other        all children   Colon cancer Neg Hx    Esophageal cancer Neg Hx    Rectal cancer Neg Hx    Stomach cancer Neg Hx    SOCIAL HISTORY Social History   Tobacco Use   Smoking status: Former    Current packs/day: 0.00    Average packs/day: 0.3 packs/day for 10.0 years (3.0 ttl pk-yrs)    Types: Cigarettes    Start date: 80  Quit date: 02/03/1975    Years since quitting: 48.8   Smokeless tobacco: Never  Vaping Use   Vaping status: Never Used  Substance Use Topics   Alcohol  use: No   Drug use: No       OPHTHALMIC EXAM:  Not recorded    IMAGING AND PROCEDURES  Imaging and Procedures for 12/21/2023         ASSESSMENT/PLAN: No diagnosis found.  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 Hypertensive retinopathy OU - discussed importance of tight BP control - monitor  4.Pseudophakia OU  - s/p CE/IOL OU (Dr. FABIENE Gaudy)  - IOLs in good position, doing well  - monitor  Ophthalmic  Meds Ordered this visit:  No orders of the defined types were placed in this encounter.    No follow-ups on file.  There are no Patient Instructions on file for this visit.  Explained the diagnoses, plan, and follow up with the patient and they expressed understanding.  Patient expressed understanding of the importance of proper follow up care.   This document serves as a record of services personally performed by Redell JUDITHANN Hans, MD, PhD. It was created on their behalf by Wanda Keens, COT an ophthalmic technician. The creation of this record is the provider's dictation and/or activities during the visit.    Electronically signed by:  Wanda GEANNIE Keens, COT  12/07/23 12:23 PM    Redell JUDITHANN Hans, M.D., Ph.D. Diseases & Surgery of the Retina and Vitreous [facility] 12/21/2023    Abbreviations: M myopia (nearsighted); A astigmatism; H hyperopia (farsighted); P presbyopia; Mrx spectacle prescription;  CTL contact lenses; OD right eye; OS left eye; OU both eyes  XT exotropia; ET esotropia; PEK punctate epithelial keratitis; PEE punctate epithelial erosions; DES dry eye syndrome; MGD meibomian gland dysfunction; ATs artificial tears; PFAT's preservative free artificial tears; NSC nuclear sclerotic cataract; PSC posterior subcapsular cataract; ERM epi-retinal membrane; PVD posterior vitreous detachment; RD retinal detachment; DM diabetes mellitus; DR diabetic retinopathy; NPDR non-proliferative diabetic retinopathy; PDR proliferative diabetic retinopathy; CSME clinically significant macular edema; DME diabetic macular edema; dbh dot blot hemorrhages; CWS cotton wool spot; POAG primary open angle glaucoma; C/D cup-to-disc ratio; HVF humphrey visual field; GVF goldmann visual field; OCT optical coherence tomography; IOP intraocular pressure; BRVO Branch retinal vein occlusion; CRVO central retinal vein occlusion; CRAO central retinal artery occlusion; BRAO branch retinal  artery occlusion; RT retinal tear; SB scleral buckle; PPV pars plana vitrectomy; VH Vitreous hemorrhage; PRP panretinal laser photocoagulation; IVK intravitreal kenalog; VMT vitreomacular traction; MH Macular hole;  NVD neovascularization of the disc; NVE neovascularization elsewhere; AREDS age related eye disease study; ARMD age related macular degeneration; POAG primary open angle glaucoma; EBMD epithelial/anterior basement membrane dystrophy; ACIOL anterior chamber intraocular lens; IOL intraocular lens; PCIOL posterior chamber intraocular lens; Phaco/IOL phacoemulsification with intraocular lens placement; PRK photorefractive keratectomy; LASIK laser assisted in situ keratomileusis; HTN hypertension; DM diabetes mellitus; COPD chronic obstructive pulmonary disease

## 2023-12-21 ENCOUNTER — Encounter (INDEPENDENT_AMBULATORY_CARE_PROVIDER_SITE_OTHER): Payer: Medicare Other | Admitting: Ophthalmology

## 2023-12-21 DIAGNOSIS — Z961 Presence of intraocular lens: Secondary | ICD-10-CM

## 2023-12-21 DIAGNOSIS — H35372 Puckering of macula, left eye: Secondary | ICD-10-CM

## 2023-12-21 DIAGNOSIS — H35033 Hypertensive retinopathy, bilateral: Secondary | ICD-10-CM

## 2023-12-21 DIAGNOSIS — I10 Essential (primary) hypertension: Secondary | ICD-10-CM

## 2024-01-10 ENCOUNTER — Ambulatory Visit: Admitting: Psychology

## 2024-01-24 ENCOUNTER — Ambulatory Visit: Admitting: Psychology

## 2024-01-24 DIAGNOSIS — F4323 Adjustment disorder with mixed anxiety and depressed mood: Secondary | ICD-10-CM

## 2024-01-24 NOTE — Progress Notes (Signed)
 "                                                                            Date: 01/24/2024  Diagnosis Adj. Disorder with anxiety and depression Symptoms unspecified Medication Status compliance  Safety none  If Suicidal or Homicidal State Action Taken: unspecified  Current Risk: low Medications unspecified Objectives unspecified Client Response full compliance  Service Location Location, [address] [place], [place] [address]                                                                                                                                                                                                                                                                       Service Code cpt [phone]  Normalize/Reframe  Facilitate problem solving  Validate/empathize  Distress tolerance skill  Emotion regulation skills  Self care activities  Self-monitoring  Mindfulness training  Session Notes:  Dx: Adjustment Disorder with Anxiety and Depression  Meds: [place]ast for asthma, zolair (injection), blood pressure Meds. No psychotropic.  Goals: Patient is looking to reduce symptoms of anxiety and depression. Needs help adjusting to retirement. Motivation and energy is very low. Individual therapy to define stategies to reduce symptoms. Goal date is 12-23. Revised goal date is 12-26  Patient agrees to a video Public Affairs Consultant) session and is aware of the platform limitations. She is at home and I am in my home office.  Kaniyah: States that she is getting more anxious and depressed about dying. There has not been any change in her medical conditions, but she has numerous medical fears and has concerns of being alone. These fears are exclusive to when she is in town and alone. It does not happen when with family in [place] , where she has spent a lot of time. She has worries that she will spend too  much money if she lives her daughter and son in [place]. She is expecting 2 great granddaughters to be  born in June. 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 Told her to make a gratitude list and review daily.                                            CONI ALM KERNS, PhD 1:10p-2:00p 50 minutes                                                                               "

## 2024-02-07 ENCOUNTER — Ambulatory Visit (INDEPENDENT_AMBULATORY_CARE_PROVIDER_SITE_OTHER): Admitting: Psychology

## 2024-02-07 DIAGNOSIS — F4323 Adjustment disorder with mixed anxiety and depressed mood: Secondary | ICD-10-CM | POA: Diagnosis not present

## 2024-02-07 NOTE — Progress Notes (Signed)
 "                                                                                           Date: 02/07/2024  Diagnosis Adj. Disorder with anxiety and depression Symptoms unspecified Medication Status compliance  Safety none  If Suicidal or Homicidal State Action Taken: unspecified  Current Risk: low Medications unspecified Objectives unspecified Client Response full compliance  Service Location Location, [address] [place], [place] [address]                                                                                                                                                                                                                                                                       Service Code cpt [phone]  Normalize/Reframe  Facilitate problem solving  Validate/empathize  Distress tolerance skill  Emotion regulation skills  Self care activities  Self-monitoring  Mindfulness training  Session Notes:  Dx: Adjustment Disorder with Anxiety and Depression  Meds: [place]ast for asthma, zolair (injection), blood pressure Meds. No psychotropic.  Goals: Patient is looking to reduce symptoms of anxiety and depression. Needs help adjusting to retirement. Motivation and energy is very low. Individual therapy to define stategies to reduce symptoms. Goal date is 12-23. Revised goal date is 12-26  Patient agrees to a video Public Affairs Consultant) session and is aware of the platform limitations. She is at home and I am in my home office.  Kilie: She reports that she is afraid to do anything and agonizes over everything. Her volunteer and social activities will be starting back up next week. This will be a significant help to her so she will not be isolated and less likely to perseverate on her anxieties. Is expecting two new great grandchildren, which she finds very exciting. She talked  about her local son who is now wants to know more about his birth father  that died a few years ago. He was adopted by Adiana's second husband when he was 1 or 2. She is okay with him asking questions, but he does tend to be harsh with her and intimidate her. We discussed her concerns about moving to be with daughter in [place]. Issue there is that they may end up being financially dependent on her. Discussed the concept of enabling and she is going to set limits/boundaries.                                                 CONI ALM KERNS, PhD 1:10p-2:00p 50 minutes                                                                               "

## 2024-02-11 NOTE — Progress Notes (Signed)
 " [facility] - Clinic Note  02/21/2024   CHIEF COMPLAINT Patient presents for Retina Follow Up  HISTORY OF PRESENT ILLNESS: Elaine Luna is a 79 y.o. female who presents to the clinic today for:  HPI     Retina Follow Up   In left eye.  This started 6 months ago.  Duration of months.  Since onset it is stable.  I, the attending physician,  performed the HPI with the patient and updated documentation appropriately.        Comments   6 month retina follow up ERM OS pt is reporting vision may not be as sharp she denies any flashes or floaters pr states she has been having more eye irritation       Last edited by Valdemar Rogue, MD on 02/28/2024  3:44 PM.     Pt states the vision is getting worse.   Referring physician: Clem Brands, PA-C  [facility] [address] [place],  [place] [address]  HISTORICAL INFORMATION:  Selected notes from the MEDICAL RECORD NUMBER Referred by Clem Brands, PA-C for decreased vision OD LEE:  Ocular Hx- PMH-   CURRENT MEDICATIONS: No current outpatient medications on file. (Ophthalmic Drugs)   No current facility-administered medications for this visit. (Ophthalmic Drugs)   Current Outpatient Medications (Other)  Medication Sig   albuterol  (VENTOLIN  HFA) 108 (90 Base) MCG/ACT inhaler Inhale 2 puffs into the lungs every 6 (six) hours as needed.   ALPRAZolam  (XANAX ) 0.25 MG tablet Take 1 tablet (0.25 mg total) by mouth 2 (two) times daily as needed for anxiety.   amLODipine  (NORVASC ) 5 MG tablet Take 0.5 tablets (2.5 mg total) by mouth daily.   atorvastatin  (LIPITOR) 20 MG tablet Take 1 tablet (20 mg total) by mouth daily.   atorvastatin  (LIPITOR) 20 MG tablet Take 1 tablet (20 mg total) by mouth daily.   chlorthalidone  (HYGROTON ) 25 MG tablet Take 1 tablet (25 mg total) by mouth daily.   Denosumab  (PROLIA  [place]) Inject into the skin every 6 (six) months.   irbesartan  (AVAPRO ) 150 MG tablet  Take 1 tablet (150 mg total) by mouth daily.   irbesartan  (AVAPRO ) 75 MG tablet Take 1 tablet (75 mg total) by mouth daily.   Vitamin D , Ergocalciferol , (DRISDOL ) 1.25 MG (50000 UNIT) CAPS capsule Take 1 capsule (50,000 Units total) by mouth 2 (two) times a week.   No current facility-administered medications for this visit. (Other)   REVIEW OF SYSTEMS: ROS   Positive for: Cardiovascular, Eyes, Respiratory Last edited by Elnor Avelina RAMAN, COT on 02/21/2024  9:34 AM.       ALLERGIES Allergies  Allergen Reactions   Ace Inhibitors     Other Reaction(s): Unknown   PAST MEDICAL HISTORY Past Medical History:  Diagnosis Date   Asthma    Colon polyps    DM type 2 (diabetes mellitus, type 2) (HCC) 2015    Hyperlipidemia    Hypertension    Obesity    Pulmonary embolism associated with COVID-19 (HCC) 12/25/2018   Tubular adenoma    Past Surgical History:  Procedure Laterality Date   APPENDECTOMY     CARDIAC CATHETERIZATION     CATARACT EXTRACTION, BILATERAL  2011   CHOLECYSTECTOMY     ESOPHAGOGASTRODUODENOSCOPY  2011   TONSILLECTOMY     VESICOVAGINAL FISTULA CLOSURE W/ TAH     FAMILY HISTORY Family History  Problem Relation Age of Onset   Heart disease Mother  Heart disease Father    Diabetes Brother    Heart disease Brother    Diabetes Maternal Grandmother    Diabetes Maternal Grandfather    Asthma Other        all children   Colon cancer Neg Hx    Esophageal cancer Neg Hx    Rectal cancer Neg Hx    Stomach cancer Neg Hx    SOCIAL HISTORY Social History   Tobacco Use   Smoking status: Former    Current packs/day: 0.00    Average packs/day: 0.3 packs/day for 10.0 years (3.0 ttl pk-yrs)    Types: Cigarettes    Start date: 68    Quit date: 02/03/1975    Years since quitting: 49.1   Smokeless tobacco: Never  Vaping Use   Vaping status: Never Used  Substance Use Topics   Alcohol  use: No   Drug use: No       OPHTHALMIC EXAM:  Base Eye Exam     Visual  Acuity (Snellen - Linear)       Right Left   Dist [place] 20/25 -2 20/20 -2   Dist ph Industry NI NI         Tonometry (Tonopen, 9:37 AM)       Right Left   Pressure 21 20         Pupils       Pupils Dark Light Shape React APD   Right PERRL 3 2 Round Brisk None   Left PERRL 3 2 Round Brisk None         Visual Fields       Left Right    Full Full         Extraocular Movement       Right Left    Full, Ortho Full, Ortho         Neuro/Psych     Oriented x3: Yes   Mood/Affect: Normal         Dilation     Both eyes: 1.0% Mydriacyl, 2.5% Phenylephrine @ 9:38 AM           Slit Lamp and Fundus Exam     Slit Lamp Exam       Right Left   Lids/Lashes Dermatochalasis - upper lid Dermatochalasis - upper lid   Conjunctiva/Sclera White and quiet White and quiet   Cornea 2+ pigmented guttata, well healed cataract wound, mild arcus, mild EBMD 2+ pigmented guttata, well healed cataract wound, mild arcus, mild EBMD   Anterior Chamber deep and clear deep and clear   Iris Round and moderately dilated Round and moderately dilated   Lens PC IOL in good position, trace Posterior capsular opacification PC IOL in good position, trace Posterior capsular opacification   Anterior Vitreous mild syneresis, Posterior vitreous detachment mild syneresis, Posterior vitreous detachment         Fundus Exam       Right Left   Disc mild pallor, sharp rim, +PPP mild Pallor, Sharp rim, PPA/PPP   C/D Ratio 0.6 0.4   Macula Flat, Blunted foveal reflex, mild RPE mottling, rare drusen, No heme or edema Flat, Blunted foveal reflex, mild RPE mottling, mild ERM nasal macula, No heme or edema   Vessels mild attenuation, mild tortuosity, AV crossing changes attenuated, mild tortuosity   Periphery Attached, No heme Attached, No heme           IMAGING AND PROCEDURES  Imaging and Procedures for 02/21/2024  OCT, Retina - OU - Both  Eyes       Right Eye Quality was good. Central Foveal  Thickness: 252. Progression has been stable. Findings include normal foveal contour, no IRF, no SRF, retinal drusen .   Left Eye Quality was good. Central Foveal Thickness: 261. Progression has been stable. Findings include normal foveal contour, no IRF, no SRF, epiretinal membrane, macular pucker (Mild ERM with pucker nasal and inferior macula-- stable).   Notes *Images captured and stored on drive  Diagnosis / Impression:  OD: NFP, no IRF/SRF OS: mild ERM with pucker nasal and inferior macula-stable  Clinical management:  See below  Abbreviations: NFP - Normal foveal profile. CME - cystoid macular edema. PED - pigment epithelial detachment. IRF - intraretinal fluid. SRF - subretinal fluid. EZ - ellipsoid zone. ERM - epiretinal membrane. ORA - outer retinal atrophy. ORT - outer retinal tubulation. SRHM - subretinal hyper-reflective material. IRHM - intraretinal hyper-reflective material           ASSESSMENT/PLAN:   ICD-10-CM   1. Epiretinal membrane (ERM) of left eye  H35.372 OCT, Retina - OU - Both Eyes    2. Essential hypertension  I10     3. Hypertensive retinopathy of both eyes  H35.033     4. Pseudophakia, both eyes  Z96.1      Epiretinal membrane, left eye  - mild ERM w/ pucker nasal macula - stable - BCVA OS 20/20 - stable - asymptomatic, no metamorphopsia - no indication for surgery at this time - OCT OS: mild ERM with pucker nasal and inferior macula- stable at 11 months - monitor for now - f/u 9-12 mos -- DFE/OCT  2,3. Hypertensive retinopathy OU - discussed importance of tight BP control - monitor   4. Pseudophakia OU  - s/p CE/IOL OU (Dr. FABIENE Gaudy)  - IOLs in good position, doing well  - monitor   Ophthalmic Meds Ordered this visit:  No orders of the defined types were placed in this encounter.    Return in about 1 year (around 02/20/2025) for f/u, ERM, DFE, OCT.  There are no Patient Instructions on file for this visit.  Explained the diagnoses,  plan, and follow up with the patient and they expressed understanding.  Patient expressed understanding of the importance of proper follow up care.   This document serves as a record of services personally performed by Redell JUDITHANN Hans, MD, PhD. It was created on their behalf by Paulina Jamse Gay an ophthalmic technician. The creation of this record is the provider's dictation and/or activities during the visit.   Electronically signed by: Alana D Fowler  02/28/24  3:49 PM   This document serves as a record of services personally performed by Redell JUDITHANN Hans, MD, PhD. It was created on their behalf by Wanda GEANNIE Keens, COT an ophthalmic technician. The creation of this record is the provider's dictation and/or activities during the visit.    Electronically signed by:  Wanda GEANNIE Keens, COT  02/28/24 3:49 PM  Redell JUDITHANN Hans, M.D., Ph.D. Diseases & Surgery of the Retina and Vitreous [facility] 02/21/2024  I have reviewed the above documentation for accuracy and completeness, and I agree with the above. Redell JUDITHANN Hans, M.D., Ph.D. 02/28/24 3:50 PM   Abbreviations: M myopia (nearsighted); A astigmatism; H hyperopia (farsighted); P presbyopia; Mrx spectacle prescription;  CTL contact lenses; OD right eye; OS left eye; OU both eyes  XT exotropia; ET esotropia; PEK punctate epithelial keratitis; PEE punctate epithelial erosions; DES dry  eye syndrome; MGD meibomian gland dysfunction; ATs artificial tears; PFAT's preservative free artificial tears; NSC nuclear sclerotic cataract; PSC posterior subcapsular cataract; ERM epi-retinal membrane; PVD posterior vitreous detachment; RD retinal detachment; DM diabetes mellitus; DR diabetic retinopathy; NPDR non-proliferative diabetic retinopathy; PDR proliferative diabetic retinopathy; CSME clinically significant macular edema; DME diabetic macular edema; dbh dot blot hemorrhages; CWS cotton wool spot; POAG primary open Elaine glaucoma;  C/D cup-to-disc ratio; HVF humphrey visual field; GVF goldmann visual field; OCT optical coherence tomography; IOP intraocular pressure; BRVO Branch retinal vein occlusion; CRVO central retinal vein occlusion; CRAO central retinal artery occlusion; BRAO branch retinal artery occlusion; RT retinal tear; SB scleral buckle; PPV pars plana vitrectomy; VH Vitreous hemorrhage; PRP panretinal laser photocoagulation; IVK intravitreal kenalog; VMT vitreomacular traction; MH Macular hole;  NVD neovascularization of the disc; NVE neovascularization elsewhere; AREDS age related eye disease study; ARMD age related macular degeneration; POAG primary open Elaine glaucoma; EBMD epithelial/anterior basement membrane dystrophy; ACIOL anterior chamber intraocular lens; IOL intraocular lens; PCIOL posterior chamber intraocular lens; Phaco/IOL phacoemulsification with intraocular lens placement; PRK photorefractive keratectomy; LASIK laser assisted in situ keratomileusis; HTN hypertension; DM diabetes mellitus; COPD chronic obstructive pulmonary disease  "

## 2024-02-21 ENCOUNTER — Ambulatory Visit (INDEPENDENT_AMBULATORY_CARE_PROVIDER_SITE_OTHER): Admitting: Ophthalmology

## 2024-02-21 ENCOUNTER — Ambulatory Visit: Admitting: Psychology

## 2024-02-21 ENCOUNTER — Encounter (INDEPENDENT_AMBULATORY_CARE_PROVIDER_SITE_OTHER): Payer: Self-pay | Admitting: Ophthalmology

## 2024-02-21 DIAGNOSIS — H35033 Hypertensive retinopathy, bilateral: Secondary | ICD-10-CM | POA: Diagnosis not present

## 2024-02-21 DIAGNOSIS — H35372 Puckering of macula, left eye: Secondary | ICD-10-CM | POA: Diagnosis not present

## 2024-02-21 DIAGNOSIS — I10 Essential (primary) hypertension: Secondary | ICD-10-CM

## 2024-02-21 DIAGNOSIS — Z961 Presence of intraocular lens: Secondary | ICD-10-CM

## 2024-02-28 ENCOUNTER — Encounter (INDEPENDENT_AMBULATORY_CARE_PROVIDER_SITE_OTHER): Payer: Self-pay | Admitting: Ophthalmology

## 2024-03-02 ENCOUNTER — Other Ambulatory Visit (HOSPITAL_BASED_OUTPATIENT_CLINIC_OR_DEPARTMENT_OTHER): Payer: Self-pay

## 2024-03-02 MED ORDER — ATORVASTATIN CALCIUM 20 MG PO TABS
20.0000 mg | ORAL_TABLET | Freq: Every day | ORAL | 3 refills | Status: AC
  Filled 2024-03-02: qty 90, 90d supply, fill #0

## 2024-03-02 MED ORDER — IRBESARTAN 75 MG PO TABS
75.0000 mg | ORAL_TABLET | Freq: Every day | ORAL | 3 refills | Status: AC
  Filled 2024-03-02: qty 90, 90d supply, fill #0

## 2024-03-02 MED ORDER — VITAMIN D (ERGOCALCIFEROL) 1.25 MG (50000 UNIT) PO CAPS
ORAL_CAPSULE | ORAL | 3 refills | Status: AC
  Filled 2024-03-02: qty 24, 90d supply, fill #0

## 2024-03-06 ENCOUNTER — Ambulatory Visit: Admitting: Psychology

## 2024-03-06 ENCOUNTER — Other Ambulatory Visit (HOSPITAL_BASED_OUTPATIENT_CLINIC_OR_DEPARTMENT_OTHER): Payer: Self-pay

## 2024-03-06 DIAGNOSIS — F4323 Adjustment disorder with mixed anxiety and depressed mood: Secondary | ICD-10-CM

## 2024-03-06 NOTE — Progress Notes (Signed)
 "                                                                                                          Date: 03/06/2024  Diagnosis Adj. Disorder with anxiety and depression Symptoms unspecified Medication Status compliance  Safety none  If Suicidal or Homicidal State Action Taken: unspecified  Current Risk: low Medications unspecified Objectives unspecified Client Response full compliance  Service Location Location, [address] [place], [place] [address]                                                                                                                                                                                                                                                                       Service Code cpt [phone]  Normalize/Reframe  Facilitate problem solving  Validate/empathize  Distress tolerance skill  Emotion regulation skills  Self care activities  Self-monitoring  Mindfulness training  Session Notes:  Dx: Adjustment Disorder with Anxiety and Depression  Meds: [place] for asthma, zolair (injection), blood pressure Meds. No psychotropic.  Goals: Patient is looking to reduce symptoms of anxiety and depression. Needs help adjusting to retirement. Motivation and energy is very low. Individual therapy to define stategies to reduce symptoms. Goal date is 12-23. Revised goal date is 12-26  Patient agrees to a video Public Affairs Consultant) session and is aware of the platform limitations. She is at home and I am in my home office.  Elaine Luna: She says that she is doing okay, but worries daily about everything. She even expresses worry over very small, insignificant items. This is worse when she has too much time on her hands. Staying busy helps. She had her 6 month check up from doctor and her numbers are wonderful. She is very relieved. We talked about  the need for  her to get up and get dressed every day, even if no place to go.  She is planning to go to [place]  a week from Wednesday for 10 days. Will see both daughter and daughter in law (widow of Eva). She reports that she is watching a lot of news and finds it very distressing. She feels powerlessness and is worried about long term consequences. Will reduce exposure.                                                  CONI ALM KERNS, PhD 1:10p-2:00p 50 minutes                                                                               "

## 2024-03-20 ENCOUNTER — Ambulatory Visit: Admitting: Psychology

## 2024-04-03 ENCOUNTER — Ambulatory Visit: Admitting: Psychology

## 2024-04-17 ENCOUNTER — Ambulatory Visit: Admitting: Psychology

## 2024-05-01 ENCOUNTER — Ambulatory Visit: Admitting: Psychology

## 2024-05-15 ENCOUNTER — Ambulatory Visit: Admitting: Psychology

## 2024-05-29 ENCOUNTER — Ambulatory Visit: Admitting: Psychology

## 2025-02-19 ENCOUNTER — Encounter (INDEPENDENT_AMBULATORY_CARE_PROVIDER_SITE_OTHER): Admitting: Ophthalmology
